# Patient Record
Sex: Female | Born: 1955 | Race: Black or African American | Hispanic: No | Marital: Single | Smoking: Never smoker
Health system: Southern US, Community
[De-identification: ages and names within clinical notes are randomized; demographics above are authoritative.]

## PROBLEM LIST (undated history)

## (undated) DIAGNOSIS — M79605 Pain in left leg: Secondary | ICD-10-CM

## (undated) DIAGNOSIS — C801 Malignant (primary) neoplasm, unspecified: Secondary | ICD-10-CM

## (undated) DIAGNOSIS — H269 Unspecified cataract: Secondary | ICD-10-CM

## (undated) DIAGNOSIS — I639 Cerebral infarction, unspecified: Secondary | ICD-10-CM

## (undated) DIAGNOSIS — F32A Depression, unspecified: Secondary | ICD-10-CM

## (undated) DIAGNOSIS — F419 Anxiety disorder, unspecified: Secondary | ICD-10-CM

## (undated) HISTORY — PX: JOINT REPLACEMENT: SHX530

## (undated) HISTORY — PX: LIVER SURGERY: SHX698

## (undated) HISTORY — DX: Cerebral infarction, unspecified: I63.9

## (undated) HISTORY — PX: ABDOMINAL HYSTERECTOMY: SHX81

## (undated) HISTORY — DX: Malignant (primary) neoplasm, unspecified: C80.1

## (undated) HISTORY — PX: COLONOSCOPY: SHX174

## (undated) HISTORY — PX: COLON SURGERY: SHX602

## (undated) HISTORY — DX: Unspecified cataract: H26.9

## (undated) HISTORY — DX: Pain in left leg: M79.605

## (undated) HISTORY — PX: KNEE ARTHROSCOPY: SUR90

## (undated) HISTORY — PX: LIVER RESECTION: SHX1977

## (undated) HISTORY — DX: Depression, unspecified: F32.A

## (undated) HISTORY — DX: Anxiety disorder, unspecified: F41.9

## (undated) HISTORY — PX: HYSTERECTOMY: SHX81

---

## 2006-01-16 ENCOUNTER — Inpatient Hospital Stay
Admission: EM | Admit: 2006-01-16 | Disposition: A | Discharge status: Home / Self Care | Payer: Self-pay | Source: Ambulatory Visit

## 2006-01-22 ENCOUNTER — Ambulatory Visit
Admission: RE | Admit: 2006-01-22 | Disposition: A | Discharge status: Home / Self Care | Payer: Self-pay | Source: Ambulatory Visit

## 2007-08-05 ENCOUNTER — Observation Stay
Admission: EM | Admit: 2007-08-05 | Disposition: A | Discharge status: Home / Self Care | Payer: Self-pay | Source: Ambulatory Visit | Admitting: Internal Medicine

## 2007-11-29 ENCOUNTER — Ambulatory Visit: Admit: 2007-11-29 | Disposition: A | Discharge status: Home / Self Care | Payer: Self-pay | Source: Ambulatory Visit

## 2007-12-10 ENCOUNTER — Ambulatory Visit
Admission: RE | Admit: 2007-12-10 | Disposition: A | Discharge status: Home / Self Care | Payer: Self-pay | Source: Ambulatory Visit

## 2008-02-07 DIAGNOSIS — C801 Malignant (primary) neoplasm, unspecified: Secondary | ICD-10-CM

## 2008-02-07 HISTORY — DX: Malignant (primary) neoplasm, unspecified: C80.1

## 2008-02-18 ENCOUNTER — Ambulatory Visit
Admission: RE | Admit: 2008-02-18 | Disposition: A | Discharge status: Home / Self Care | Payer: Self-pay | Source: Ambulatory Visit | Admitting: Gastroenterology

## 2008-02-26 ENCOUNTER — Inpatient Hospital Stay
Admission: RE | Admit: 2008-02-26 | Disposition: A | Discharge status: Home / Self Care | Payer: Self-pay | Source: Ambulatory Visit | Admitting: Surgery

## 2008-04-16 ENCOUNTER — Ambulatory Visit: Admit: 2008-04-16 | Disposition: A | Discharge status: Home / Self Care | Payer: Self-pay | Source: Ambulatory Visit

## 2008-06-18 ENCOUNTER — Ambulatory Visit
Admission: RE | Admit: 2008-06-18 | Disposition: A | Discharge status: Home / Self Care | Payer: Self-pay | Source: Ambulatory Visit

## 2008-07-01 ENCOUNTER — Ambulatory Visit
Admission: RE | Admit: 2008-07-01 | Disposition: A | Discharge status: Home / Self Care | Payer: Self-pay | Source: Ambulatory Visit

## 2008-07-07 ENCOUNTER — Ambulatory Visit
Admission: RE | Admit: 2008-07-07 | Disposition: A | Discharge status: Home / Self Care | Payer: Self-pay | Source: Ambulatory Visit

## 2008-07-15 ENCOUNTER — Ambulatory Visit
Admission: RE | Admit: 2008-07-15 | Disposition: A | Discharge status: Home / Self Care | Payer: Self-pay | Source: Ambulatory Visit

## 2008-10-05 ENCOUNTER — Ambulatory Visit
Admission: RE | Admit: 2008-10-05 | Disposition: A | Discharge status: Home / Self Care | Payer: Self-pay | Source: Ambulatory Visit

## 2008-11-25 ENCOUNTER — Inpatient Hospital Stay
Admission: AD | Admit: 2008-11-25 | Disposition: A | Discharge status: Home / Self Care | Payer: Self-pay | Source: Ambulatory Visit | Admitting: Hematology & Oncology

## 2009-02-25 ENCOUNTER — Ambulatory Visit
Admission: RE | Admit: 2009-02-25 | Disposition: A | Discharge status: Home / Self Care | Payer: Self-pay | Source: Ambulatory Visit

## 2009-03-09 DIAGNOSIS — C787 Secondary malignant neoplasm of liver and intrahepatic bile duct: Secondary | ICD-10-CM

## 2009-03-11 ENCOUNTER — Ambulatory Visit
Admission: RE | Admit: 2009-03-11 | Disposition: A | Discharge status: Home / Self Care | Payer: Self-pay | Source: Ambulatory Visit

## 2009-03-29 ENCOUNTER — Ambulatory Visit
Admission: RE | Admit: 2009-03-29 | Disposition: A | Discharge status: Home / Self Care | Payer: Self-pay | Source: Ambulatory Visit

## 2009-04-12 ENCOUNTER — Ambulatory Visit
Admission: RE | Admit: 2009-04-12 | Disposition: A | Discharge status: Home / Self Care | Payer: Self-pay | Source: Ambulatory Visit

## 2009-04-30 ENCOUNTER — Ambulatory Visit
Admission: RE | Admit: 2009-04-30 | Disposition: A | Discharge status: Home / Self Care | Payer: Self-pay | Source: Ambulatory Visit

## 2009-11-04 ENCOUNTER — Ambulatory Visit
Admission: RE | Admit: 2009-11-04 | Disposition: A | Discharge status: Home / Self Care | Payer: Self-pay | Source: Ambulatory Visit

## 2010-08-31 ENCOUNTER — Ambulatory Visit
Admission: RE | Admit: 2010-08-31 | Disposition: A | Discharge status: Home / Self Care | Payer: Self-pay | Source: Ambulatory Visit

## 2010-09-14 ENCOUNTER — Ambulatory Visit
Admission: RE | Admit: 2010-09-14 | Disposition: A | Discharge status: Home / Self Care | Payer: Self-pay | Source: Ambulatory Visit

## 2011-06-05 ENCOUNTER — Ambulatory Visit
Admission: RE | Admit: 2011-06-05 | Disposition: A | Discharge status: Home / Self Care | Payer: Self-pay | Source: Ambulatory Visit

## 2011-09-14 ENCOUNTER — Ambulatory Visit: Admit: 2011-09-14 | Disposition: A | Discharge status: Home / Self Care | Payer: Self-pay | Source: Ambulatory Visit

## 2011-09-15 ENCOUNTER — Ambulatory Visit
Admission: RE | Admit: 2011-09-15 | Disposition: A | Discharge status: Home / Self Care | Payer: Self-pay | Source: Ambulatory Visit

## 2011-12-01 ENCOUNTER — Ambulatory Visit
Admission: RE | Admit: 2011-12-01 | Disposition: A | Discharge status: Home / Self Care | Payer: Self-pay | Source: Ambulatory Visit

## 2011-12-21 ENCOUNTER — Ambulatory Visit
Admission: RE | Admit: 2011-12-21 | Disposition: A | Discharge status: Home / Self Care | Payer: Self-pay | Source: Ambulatory Visit | Admitting: Orthopaedic Surgery

## 2012-02-15 ENCOUNTER — Ambulatory Visit: Admit: 2012-02-15 | Disposition: A | Discharge status: Home / Self Care | Payer: Self-pay | Source: Ambulatory Visit

## 2012-03-13 LAB — CEA: CEA-Abbott: 0.58 ng/mL (ref 0.00–3.00)

## 2012-03-14 ENCOUNTER — Ambulatory Visit
Admission: RE | Admit: 2012-03-14 | Disposition: A | Discharge status: Home / Self Care | Payer: Self-pay | Source: Ambulatory Visit

## 2012-03-14 LAB — CBC AND DIFFERENTIAL
Basophils %: 0.3 % (ref 0.0–3.0)
Basophils Absolute: 0 10*3/uL (ref 0.0–0.3)
Eosinophils %: 0.9 % (ref 0.0–7.0)
Eosinophils Absolute: 0.1 10*3/uL (ref 0.0–0.8)
Hematocrit: 47.7 % (ref 36.0–48.0)
Hemoglobin: 15.4 gm/dL (ref 12.0–16.0)
Lymphocytes Absolute: 2.3 10*3/uL (ref 0.6–5.1)
Lymphocytes: 25.1 % (ref 15.0–46.0)
MCH: 30 pg (ref 28–35)
MCHC: 32 gm/dL (ref 32–36)
MCV: 94 fL (ref 80–100)
MPV: 7.7 fL (ref 6.0–10.0)
Monocytes Absolute: 0.5 10*3/uL (ref 0.1–1.7)
Monocytes: 5 % (ref 3.0–15.0)
Neutrophils %: 68.7 % (ref 42.0–78.0)
Neutrophils Absolute: 6.3 10*3/uL (ref 1.7–8.6)
PLT CT: 269 10*3/uL (ref 130–440)
RBC: 5.06 10*6/uL — ABNORMAL HIGH (ref 3.80–5.00)
RDW: 11.8 % (ref 11.0–14.0)
WBC: 9.2 10*3/uL (ref 4.0–11.0)

## 2012-03-14 LAB — COMPREHENSIVE METABOLIC PANEL
ALT: 21 U/L (ref 0–55)
AST (SGOT): 23 U/L (ref 10–42)
Albumin/Globulin Ratio: 0.93 Ratio (ref 0.70–1.50)
Albumin: 4.1 gm/dL (ref 3.5–5.0)
Alkaline Phosphatase: 117 U/L (ref 40–145)
Anion Gap: 12.8 mMol/L (ref 7.0–18.0)
BUN / Creatinine Ratio: 19 Ratio (ref 10.0–30.0)
BUN: 19 mg/dL (ref 7–22)
Bilirubin, Total: 0.6 mg/dL (ref 0.1–1.2)
CO2: 27.1 mMol/L (ref 20.0–30.0)
Calcium: 10.3 mg/dL (ref 8.5–10.5)
Chloride: 104 mMol/L (ref 98–110)
Creatinine: 1 mg/dL (ref 0.60–1.20)
EGFR: >60 mL/min/{1.73_m2}
Globulin: 4.4 gm/dL — ABNORMAL HIGH (ref 2.0–4.0)
Glucose: 78 mg/dL (ref 70–99)
Osmolality Calc: 281 mOsm/kg (ref 275–300)
Potassium: 3.9 mMol/L (ref 3.5–5.3)
Protein, Total: 8.5 gm/dL — ABNORMAL HIGH (ref 6.0–8.3)
Sodium: 140 mMol/L (ref 136–147)

## 2012-03-18 LAB — PT AND APTT
PT INR: 1 (ref 0.5–1.3)
PT: 10.9 s (ref 9.5–12.4)
aPTT: 27.4 s (ref 24.0–34.0)

## 2012-03-18 LAB — COMPREHENSIVE METABOLIC PANEL
ALT: 21 U/L (ref 0–55)
AST (SGOT): 18 U/L (ref 10–42)
Albumin/Globulin Ratio: 0.97 Ratio (ref 0.70–1.50)
Albumin: 3.7 gm/dL (ref 3.5–5.0)
Alkaline Phosphatase: 107 U/L (ref 40–145)
Anion Gap: 13 mMol/L (ref 7.0–18.0)
BUN / Creatinine Ratio: 16.2 Ratio (ref 10.0–30.0)
BUN: 16 mg/dL (ref 7–22)
Bilirubin, Total: 0.6 mg/dL (ref 0.1–1.2)
CO2: 26.1 mMol/L (ref 20.0–30.0)
Calcium: 9.9 mg/dL (ref 8.5–10.5)
Chloride: 105 mMol/L (ref 98–110)
Creatinine: 0.99 mg/dL (ref 0.60–1.20)
EGFR: >60 mL/min/{1.73_m2}
Globulin: 3.8 gm/dL (ref 2.0–4.0)
Glucose: 88 mg/dL (ref 70–99)
Osmolality Calc: 280 mOsm/kg (ref 275–300)
Potassium: 4.1 mMol/L (ref 3.5–5.3)
Protein, Total: 7.5 gm/dL (ref 6.0–8.3)
Sodium: 140 mMol/L (ref 136–147)

## 2012-03-18 LAB — CBC AND DIFFERENTIAL
Basophils %: 0 % (ref 0.0–3.0)
Basophils Absolute: 0 10*3/uL (ref 0.0–0.3)
Eosinophils %: 0.9 % (ref 0.0–7.0)
Eosinophils Absolute: 0.1 10*3/uL (ref 0.0–0.8)
Hematocrit: 42.4 % (ref 36.0–48.0)
Hemoglobin: 14.6 gm/dL (ref 12.0–16.0)
Lymphocytes Absolute: 2.2 10*3/uL (ref 0.6–5.1)
Lymphocytes: 25 % (ref 15.0–46.0)
MCH: 32 pg (ref 28–35)
MCHC: 34 gm/dL (ref 32–36)
MCV: 93 fL (ref 80–100)
MPV: 7.1 fL (ref 6.0–10.0)
Monocytes Absolute: 0.5 10*3/uL (ref 0.1–1.7)
Monocytes: 6.2 % (ref 3.0–15.0)
Neutrophils %: 67.9 % (ref 42.0–78.0)
Neutrophils Absolute: 5.9 10*3/uL (ref 1.7–8.6)
PLT CT: 244 10*3/uL (ref 130–440)
RBC: 4.55 10*6/uL (ref 3.80–5.00)
RDW: 11.6 % (ref 11.0–14.0)
WBC: 8.7 10*3/uL (ref 4.0–11.0)

## 2012-06-03 ENCOUNTER — Ambulatory Visit
Admission: RE | Admit: 2012-06-03 | Disposition: A | Discharge status: Home / Self Care | Payer: Self-pay | Source: Ambulatory Visit | Attending: Hematology & Oncology | Admitting: Hematology & Oncology

## 2012-06-03 LAB — CEA: CEA-Abbott: <0.5 ng/mL (ref 0.00–3.00)

## 2012-06-18 LAB — ECG 12-LEAD
P Wave Axis: 55 deg
P Wave Duration: 92 ms
P-R Interval: 150 ms
Patient Age: 56 years
Q-T Dispersion: 18 ms
Q-T Interval(Corrected): 428 ms
Q-T Interval: 412 ms
QRS Axis: 37 deg
QRS Duration: 78 ms
T Axis: 36 deg
Ventricular Rate: 65 /min

## 2012-06-21 ENCOUNTER — Other Ambulatory Visit: Payer: Self-pay | Admitting: Hematology & Oncology

## 2012-06-21 DIAGNOSIS — D010 Carcinoma in situ of colon: Secondary | ICD-10-CM

## 2012-06-24 ENCOUNTER — Other Ambulatory Visit: Payer: Self-pay

## 2012-06-24 ENCOUNTER — Encounter (INDEPENDENT_AMBULATORY_CARE_PROVIDER_SITE_OTHER): Payer: Self-pay | Admitting: Surgery

## 2012-06-24 ENCOUNTER — Ambulatory Visit (INDEPENDENT_AMBULATORY_CARE_PROVIDER_SITE_OTHER): Payer: Medicare Other | Admitting: Surgery

## 2012-06-24 VITALS — BP 122/82 | HR 72 | Ht 69.0 in | Wt 200.0 lb

## 2012-06-24 NOTE — Progress Notes (Signed)
History and Physical  Patient Care Team:  Lendon Ka, DO as PCP - General    06/24/2012    CC:   Chief Complaint   Patient presents with   . Initial Assessment     resected colon       HPI:  Tamara Bautista is a 57 y.o. female who presents for evaluation for a follow up colonoscopy. She has Stage IV colon cancer but is disease free now. She was diagnosed in 2010 and has not had a follow up colonoscopy.         Past Medical History    No Known Allergies    Past Medical History   Diagnosis Date   . Cancer    . Anxiety    . Depression        Past Surgical History   Procedure Date   . Colon surgery    Liver Resection    Current outpatient prescriptions:FLUoxetine (PROZAC) 20 MG capsule, Take 20 mg by mouth daily., Disp: , Rfl:     History     Social History   . Marital Status: Single     Spouse Name: N/A     Number of Children: N/A   . Years of Education: N/A     Occupational History   . Not on file.     Social History Main Topics   . Smoking status: Never Smoker    . Smokeless tobacco: Not on file   . Alcohol Use: No   . Drug Use: No   . Sexually Active:      Other Topics Concern   . Not on file     Social History Narrative   . No narrative on file       No family history on file.    Review of Systems   Constitutional: Negative for fever, chills, diaphoresis, activity change, appetite change, fatigue and unexpected weight change.   HENT: Negative for hearing loss, ear pain, nosebleeds, congestion, facial swelling, rhinorrhea, sneezing, neck pain, neck stiffness, postnasal drip, tinnitus and ear discharge.    Eyes: Negative for photophobia, pain, discharge, redness, itching and visual disturbance.   Respiratory: Negative for apnea, cough, choking, chest tightness, shortness of breath, wheezing and stridor.    Cardiovascular: Negative for chest pain, palpitations and leg swelling.   Gastrointestinal: Negative for abdominal distention, pain, bleeding, change in bowel habits, or any other symptoms.    Genitourinary: Negative for dysuria, urgency, frequency, hematuria, flank pain, decreased urine volume, vaginal bleeding, vaginal discharge, enuresis, difficulty urinating, genital sores, vaginal pain, menstrual problem, pelvic pain and dyspareunia.   Musculoskeletal: Negative for back pain, joint swelling, arthralgias and gait problem.   Neurological: Negative for dizziness, tremors, seizures, syncope, facial asymmetry, speech difficulty, weakness, light-headedness, numbness and headaches.   Hematological: Negative for adenopathy. Does not bruise/bleed easily.   Psychiatric/Behavioral: Negative for hallucinations, behavioral problems, confusion, dysphoric mood, decreased concentration and agitation.       PE:  BP 122/82  Pulse 72  Ht 1.753 m (5\' 9" )  Wt 90.719 kg (200 lb)  BMI 29.52 kg/m2  General: Alert, oriented X3, in no acute distress  Heent : NC/AT. PERRL. Sclera non-icteric  Neck: Supple, no lymphadenopathy, no bruits  Lungs: CTA  CV: RRR S1 and S2 normal  Abdomen: soft, nontender, non-distended, no masses, no hernia  Extremities:  no clubbing, cyanosis, or edema    Assesment/Plan:    Colonoscopy    The risks, benefits, and alternatives to colonoscopy  were discussed with the patient. Informed consent (infection, bleeding, anesthesia risks, need for further operations/procedures, perforation of the colon, misdiagnoses, missed polyps) was obtained.    This has been scheduled.

## 2012-06-30 NOTE — Discharge Summary (Signed)
[facility]                               DISCHARGE SUMMARY              NAME: Tamara Bautista, Tamara Bautista       ADMITTED:   12/21/2011       MR#:  [id number]                            DISCHARGED: 12/21/2011       DOB:  07-09-1955                           ACCT#:      [id number]       _________________________________________________________________       ADMISSION DIAGNOSIS:       Left knee partial medial meniscectomy and chondrosis.              POSTOPERATIVE DIAGNOSIS:       Left knee partial medial meniscectomy and chondrosis.              PROCEDURE:       Left knee arthroscopic partial medial meniscectomy and       chondroplasty.              SURGEON:       Doylene Canning, MD              ASSISTANT:       Darrold Span, CST              DISCHARGE DISPOSITION:       Home.              DISCHARGE CONDITION:       Good.              FOLLOWUP:       Follow up in 10 days.              DISCHARGE INSTRUCTIONS:       Weightbearing as tolerated with crutches until able to ambulate       with a limp.  Began knee exercises on postop day.  Follow up in       10 days.              DISCHARGE MEDICATIONS:       Medications at discharge include Percocet, Phenergan, Naprosyn.                                            **PRELIMINARY REPORT UNLESS SIGNED**                                SEE DOCUMENT IMAGING SYSTEM FOR FINAL REPORT                                                        ________________________________  Doylene Canning, MD                                                                                                                    ID:   [id number]                                                          DocType:   [id number]       \:   RP                                                                 /:   837       DD:  12/21/2011 13:54:17                                                DT:  12/22/2011 01:24:26                                                 JOB: [id number]                                                            CC:  ERIN H BUETTIN ([id number])                          >                                                                  Authenticated by Stark Bray, MD On 12/25/2011 12:33:41 PM

## 2012-06-30 NOTE — H&P (Signed)
[facility]                             HISTORY AND PHYSICAL              NAME: Tamara Bautista, Tamara Bautista       ADMITTED:   12/21/2011       MR#:  [id number]                            ACCT#:      [id number]       DOB:  Feb 27, 1955       _________________________________________________________________       HISTORY OF PRESENT ILLNESS:       Tamara Bautista has been having left knee pain for the last       couple of months.  She states she did not have a particular       injury to her knee, but she has constant pain at this point with       swelling.  She also states she has a snapping type sensation at       times.  She did have an MRI of her left knee back in August,       which did show some degenerative changes with a tear of the       medial meniscus.  She has failed conservative treatments       including intra-articular cortisone injection and oral pain       medication.  For further details of history of present illness,       please see Tamara Bautista note.              PRIMARY CARE PROVIDER:       Sherolyn Buba, DO              ONCOLOGIST:       Tamara Grills, MD              PAST MEDICAL HISTORY:       1.  Depression.       2.  Anxiety.       3.  Bilateral cataracts.       4.  History of migraine headaches.       5.  History of colon cancer.       6.  History of liver cancer.       7.  History of TIA, 2007.       8.  Questionable history of COPD per physician portal.       9.  Questionable history of asthma per physician portal.       10.  Questionable history of hypertension per physician           portal.              The patient denies the questionable diagnoses.              PAST SURGICAL HISTORY:       1.  Hysterectomy.       2.  Partial hepatectomy x2.       3.  Liver embolization.       4.  Colon resection, 2010.       5.  Left knee arthroscopy approximately 20 years ago.              FAMILY HISTORY:  1.  Mother deceased in her 24s with an unknown type  of           cancer with a history of COPD.       2.  Father deceased in his 70s or 17s with gastric cancer.              SOCIAL HISTORY:       The patient socially drinks alcohol.  She has never used       tobacco.  Denies illicit drug use.  She is single with one child       and her roommate, sister and son will be available to help her       after surgery.  She is currently disabled because of her cancer       diagnosis.                     MEDICATIONS:       1.  Prozac 20 mg in the morning.       2.  Vicodin 5/325 every 6 hours as needed for pain.       3.  Advair, unknown dosage (patient does not currently use).              ALLERGIES:       The patient denies any medication, food, latex or topical iodine       allergies.              REVIEW OF SYSTEMS:       INFECTIOUS DISEASE:  The patient denies a history of staph       infection in the past.  ANESTHESIA:  The patient denies any       problems with anesthesia in the past for herself or any blood       relatives.  GENERAL:  Denies fever, chills.  SKIN:  Denies       rashes or open sores.  HEENT:  She wears glasses.  Has an upper       full denture.  Denies headache, dizziness, hearing loss,       tinnitus, sinus congestion, sore throat, dental pain, gum       infections or oral ulcers.  CV:  Denies chest pain or syncope.       RESPIRATORY:  Positive for exertional dyspnea.  There is a       questionable diagnosis of both COPD and asthma mentioned in a       physician portal history and physical.  The patient denies this,       although she states she was on Advair and did not see any       difference in her dyspnea.  Denies coughing or wheezing.       ENDOCRINE:  Denies heat or cold intolerance, polyuria, or       polydipsia.  GI:  She does have a history of liver cancer and       colon cancer, undergoing surgery for both of those.  Denies any       dysphagia, reflux, heartburn, nausea, vomiting, constipation,       diarrhea, or blood in her stool.  GU:  She  has stress type       urinary incontinence.  Denies any current dysuria, hematuria,       renal calculi, or problems with repeat urinary tract infections.       HEME:  Denies bleeding disorders, blood clots, blood  transfusions in the past.  MUSCULOSKELETAL:  Positive for left       knee pain with associated symptoms.  Please see history of       present illness for full details.  Denies generalized joint pain       or stiffness, back pain or neck pain.  NEURO:  She apparently       has had a TIA in 2007.  Denies seizures, numbness, or       paresthesias.  PSYCH:  Positive for depression and anxiety,       which are currently controlled on medication.              PHYSICAL EXAMINATION:       GENERAL:  Tamara Bautista is a very pleasant, well-nourished,       well-developed, 57 year old female in no acute distress.  She       has normal mood and affect.       VITAL SIGNS:  For a complete set of vital signs, please see       nurse's note.  Blood pressure 113/82, height 5 feet 9 inches,       weight 89 kg/195.8 pounds, BMI 28.9.  Current pain level in her       left knee at rest is 7/10.       SKIN:  Warm and dry to touch.  Skin over the left knee shows       well-healed incisional portal scars, but otherwise this area is       free and clear of any scratches, excoriations, lesions or       rashes.       HEENT:  Head is atraumatic, normocephalic.  She is wearing       glasses.  Pupils PERRLA.  EOMs intact.       NECK:  Supple.  No masses, lymphadenopathy or thyromegaly.       HEART:  Regular rate and rhythm.       LUNGS:  Clear to auscultation.       ABDOMEN:  Soft, nontender, nondistended.  Normoactive bowel       sounds.       GU:  Deferred.              MUSCULOSKELETAL:  Generally, she has good range of motion of her       upper and lower extremities.  No generalized joint swelling or       tenderness.  For details of left knee examination, please see       Tamara Bautista note.       VASCULAR:  No carotid bruits  or peripheral edema noted.  Radial       and posterior tib pulses are intact.       NEUROLOGIC:  Cranial nerves 2-12 intact.  Sensation is decreased       over her left leg, but otherwise is intact globally to light       touch.  Strength 5/5 bilateral upper extremities, 5/5 right       lower extremity and left lower extremity not tested due to pain.              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  There is subchondral marrow       edema in the medial tibial plateau due to overlying articular       cartilage thinning.  Small joint effusion with small Baker cyst.       Mild chondromalacia patella."              ASSESSMENT:       1.  Left knee medial meniscal tear.       2.  Depression.       3.  Anxiety.       4.  Bilateral cataracts.       5.  History of migraine headaches.       6.  History of colon cancer.       7.  History of liver cancer.       8.  History of transient ischemic attack.       9.  Questionable history of chronic obstructive pulmonary           disease per physician portal.       10.  Questionable history of asthma per physician portal.       11.  Questionable history of hypertension per physician           portal.              The patient denies the questionable diagnoses.              PLAN:       Patient is scheduled to undergo a left knee arthroscopy with       partial medial meniscectomy by Dr. Doylene Canning on December 21, 2011.                                            **PRELIMINARY REPORT UNLESS SIGNED**                                SEE DOCUMENT IMAGING SYSTEM FOR FINAL REPORT                I hereby certify this patient for hospitalization based upon        medical necessity as noted above.                                      The above dictated by Kelli Churn, NP                                         ________________________________                                        Doylene Canning, MD  ID:   [id number]                                                          DocType:   01TRH       \:   RR                                                                        /:   837       DD:  12/14/2011 46:96:29                                                DT:  12/14/2011 08:20:11                                                JOB: [id number]                                                            CC:  ERIN H BUETTIN ([id number])                   >                                                                  Authenticated by Stark Bray, MD On 12/25/2011 12:34:28 PM

## 2012-06-30 NOTE — Op Note (Signed)
[facility]                           OPERATIVE/PROCEDURE REPORT         NAME: Tamara Bautista, Tamara Bautista       ADMITTED:   03/14/2012       MR#:  [id number]                            ACCT#:      [id number]       DOB:  [birth year]       _________________________________________________________________       DATE OF OPERATION:           PREOPERATIVE DIAGNOSIS:       Left knee medial tibial plateau insufficiency fracture.         POSTOPERATIVE DIAGNOSIS:       Left knee medial tibial plateau insufficiency fracture.         PROCEDURE:       Arthroscopic assisted fixation of medial tibial plateau fracture       with self hardening bone graft substitute without hardware.         SURGEON:       Doylene Canning, MD         ASSISTANT:       Starleen Arms, PA         ANESTHESIA:       General.         ESTIMATED BLOOD LOSS:       6 cc.         INDICATIONS:       Tamara Bautista is a 57 year old woman with medial insufficiency       fracture in tibial plateau.  She failed conservative measures.       She had meniscal tear as well, which was treated       arthroscopically previously.  Unfortunately, this did not fully       relieve her pain, but continued to have pain from the       insufficiency fracture of the medial side of the joint and       failed to get relief of pain with conservative care.  She       elected for surgical intervention.  Risks and benefits were       detailed according to her chart.         DESCRIPTION OF PROCEDURE:       The patient was identified in the preoperative holding area.       Consent was reviewed.  Operative site was marked.  She was       transferred to the operative suite, where general anesthetic was       induced.  She was properly positioned on the table.  All bony       prominences were padded.  Left lower extremity was prepped and       draped in usual sterile fashion.  Tourniquet was inflated after       antibiotic administration.         We  entered the knee through anterior and lateral portal through       her prior incision.  We inspected the knee.  We found no new       damage, no meniscal tears, no significant changes in the level       of chondrosis throughout  the knee.  We then used fluoroscopic       guidance to place the trephine for bone graft substitute       injection into the center of the insufficiency fracture, both on       AP and lateral radiographs.  Once this was completed, we         injected 5 cc of ceramic self hardening bone graft substitute       into the affected area, turning the trephination and the cannula       in between each injection so as to provide full coverage.  We       then inserted the trocar back down the center to inject the       final ______ cannula, segments of portion of the graft were then       left in place for 10 minutes to the hardened.  This provided a       fixation.  Arthroscopic visualization during this should no       advancement of the bone graft substitute through the cartilage       and the joint.  The cartilage was in the same repair as before       the injection.  After it had dried, we removed the cannula,       closed the incisions with 3-0 nylon.  Sterile dressing was       applied.  Postoperative injection was placed intra-articularly       before the dressing.  Toradol, bupivacaine and Astramorph was       injected into the suprapatellar pouch of the knee.         Patient will be discharged home in the care of her family.       Medications on discharge include OxyContin 30 mg b.i.d.,       Dilaudid 2 mg p.o. q.4 h. p.r.n. and Phenergan 25 mg p.o. q. 8       h. p.r.n.  Appear to be touchdown weightbearing left lower       extremity with crutches.  Follow up with me in 10 days.  Begin       knee exercises at home for range of motion.                                  **PRELIMINARY REPORT UNLESS SIGNED**                          SEE DOCUMENT IMAGING SYSTEM FOR FINAL REPORT                                             ________________________________                                        Doylene Canning, MD               ID:   [id number]       DocType:   01TRO       \:   SP       /:   837       DD:  03/14/2012 16:21:08  DT:  03/15/2012 07:13:53       JOB: [id number]       CC:  ERIN H BUETTIN ([id number])         >  Authenticated and Edited by Stark Bray, MD On 03/21/12 10:14:37 AM

## 2012-06-30 NOTE — Op Note (Signed)
[facility]                           OPERATIVE/PROCEDURE REPORT         NAME: Tamara Bautista, Tamara Bautista       ADMITTED:   12/21/2011       MR#:  [id number]                            ACCT#:      [id number]       DOB:  Apr 29, 1955       _________________________________________________________________       DATE OF OPERATION:       12/21/2011         PREOPERATIVE DIAGNOSIS:       Left knee medial meniscal tear.         POSTOPERATIVE DIAGNOSIS:       Left knee medial meniscal tear.         PROCEDURE:       Partial medial meniscectomy and chondroplasty.         SURGEON:       Doylene Canning, MD         ASSISTANT:       Darrold Span, CST         ANESTHESIA:       General.         ESTIMATED BLOOD LOSS:       Less than 10 cc.         INDICATIONS:       Ms. Schatzman is a 57 year old woman, who has persistent       medial-sided left knee pain, failing conservative measures.  MRI       and exam were consistent with the above diagnoses.  She elected       for surgical intervention.  Risks and benefits were detailed       according to the patient's chart.         DESCRIPTION OF PROCEDURE:       On the day of the procedure, the patient was identified in the       preoperative holding area.  Consent was reviewed and the       operative site was marked.  They were then taken to the       operative suite where general anesthesia with intubation was       performed.  They were then properly positioned on the table with       all bony prominences padded.  An upper body Bair Hugger was       applied.  Time-out was performed and antibiotics were       administered.  The operative extremity was placed in a leg       holder with a tourniquet.  The nonoperative extremity was placed       in the foot and ankle stirrup.  TED hose and SCDs were applied       to the contralateral lower extremity.  The operative lower       extremity was then prepped and draped in the usual sterile       fashion.   The limb was exsanguinated.  The tourniquet was       inflated and the procedure was begun with the identification of       bony landmarks and an anterior lateral viewing portal.  Anterior  medial portal was then created under direct visualization and       inspection of the knee was performed.         Initial inspection of the knee revealed intact lateral joint       space, intact cartilage, no meniscal tear.  ACL and PCL were         intact.  Patellofemoral joint showed significant ulceration of       the trochlear cartilage over a fairly broad area with some loose       edges, which were stabilized by debridement with electric       shaver.  There was some minor grade 3 fissuring at the apex of       the patella, but was otherwise intact.         We entered the medial joint space and identified significant       partial ulceration and delamination of the cartilage of the       medial femoral condyle as well.  There was softening and       fibrillation of the tibial plateau on the medial side.  There       was a horizontal degenerative tear of the medial meniscus, on       which we performed a partial crescentic medial meniscectomy using       basket forceps and shaver.  Once this was completed, the knee       was evacuated of all fluid and loose bodies.  The portals were       closed with 3-0 nylon.  Injection of Toradol, Astramorph, And       bupivacaine was placed into the intra-articular space, and then       a sterile dressing was applied.  Patient was awakened by       Anesthesia, transported to postop recovery room without       complications.                                  **PRELIMINARY REPORT UNLESS SIGNED**                          SEE DOCUMENT IMAGING SYSTEM FOR FINAL REPORT                                            ________________________________                                        Doylene Canning, MD               ID:   [id number]       DocType:   01TRO       \:   RP       /:   837       DD:   12/21/2011 13:54:17       DT:  12/22/2011 01:28:04       JOB: [id number]       CC:  ERIN H BUETTIN ([id number])         >  Authenticated and Edited by Stark Bray, MD On 12/25/11 9:51:02 AM

## 2012-07-16 ENCOUNTER — Ambulatory Visit (HOSPITAL_BASED_OUTPATIENT_CLINIC_OR_DEPARTMENT_OTHER): Payer: Medicare Other | Admitting: Surgery

## 2012-07-16 ENCOUNTER — Encounter (HOSPITAL_BASED_OUTPATIENT_CLINIC_OR_DEPARTMENT_OTHER)
Admission: RE | Disposition: A | Discharge status: Home / Self Care | Payer: Self-pay | Source: Ambulatory Visit | Attending: Surgery

## 2012-07-16 ENCOUNTER — Encounter (HOSPITAL_BASED_OUTPATIENT_CLINIC_OR_DEPARTMENT_OTHER): Payer: Self-pay

## 2012-07-16 ENCOUNTER — Ambulatory Visit
Admission: RE | Admit: 2012-07-16 | Discharge: 2012-07-16 | Disposition: A | Discharge status: Home / Self Care | Payer: Medicare Other | Source: Ambulatory Visit | Attending: Surgery | Admitting: Surgery

## 2012-07-16 DIAGNOSIS — C189 Malignant neoplasm of colon, unspecified: Secondary | ICD-10-CM | POA: Insufficient documentation

## 2012-07-16 DIAGNOSIS — Z1211 Encounter for screening for malignant neoplasm of colon: Secondary | ICD-10-CM | POA: Insufficient documentation

## 2012-07-16 DIAGNOSIS — Z800 Family history of malignant neoplasm of digestive organs: Secondary | ICD-10-CM | POA: Insufficient documentation

## 2012-07-16 HISTORY — PX: COLONOSCOPY: SHX174

## 2012-07-16 SURGERY — DONT USE, USE 1094-COLONOSCOPY, DIAGNOSTIC (SCREENING)
Anesthesia: Conscious Sedation | Site: Abdomen | Wound class: Clean Contaminated

## 2012-07-16 MED ORDER — SODIUM CHLORIDE 0.9 % IV SOLN
INTRAVENOUS | Status: DC
Start: 2012-07-16 — End: 2012-07-16

## 2012-07-16 MED ORDER — MIDAZOLAM HCL 5 MG/5ML IJ SOLN
INTRAMUSCULAR | Status: DC | PRN
Start: 2012-07-16 — End: 2012-07-16
  Administered 2012-07-16 (×2): 2 mg via INTRAVENOUS

## 2012-07-16 MED ORDER — LIDOCAINE-EPINEPHRINE 1 %-1:100000 IJ SOLN
INTRAMUSCULAR | Status: AC
Start: 2012-07-16 — End: ?
  Filled 2012-07-16: qty 20

## 2012-07-16 MED ORDER — MIDAZOLAM HCL 5 MG/5ML IJ SOLN
INTRAMUSCULAR | Status: AC
Start: 2012-07-16 — End: ?
  Filled 2012-07-16: qty 10

## 2012-07-16 MED ORDER — MEPERIDINE HCL 100 MG/2ML IJ SOLN
INTRAMUSCULAR | Status: DC | PRN
Start: 2012-07-16 — End: 2012-07-16
  Administered 2012-07-16 (×2): 50 mg

## 2012-07-16 MED ORDER — MEPERIDINE HCL 50 MG/ML IJ SOLN
INTRAMUSCULAR | Status: AC
Start: 2012-07-16 — End: ?
  Filled 2012-07-16: qty 3

## 2012-07-16 SURGICAL SUPPLY — 2 items
CONN IRRG TORRENT 1WAYVAL ENDO (Supply) ×2 IMPLANT
TUBING IRRIGATION TORRENT (Supply) ×2 IMPLANT

## 2012-07-16 NOTE — Op Note (Signed)
See provation

## 2012-07-16 NOTE — Discharge Summary (Signed)
The patient was admitted for outpatient surgery.  She did well and was discharged afterwards. Please see EPIC EMR for discharge meds, diet, followup and procedure.

## 2012-07-16 NOTE — Discharge Instructions (Signed)
[facility]  Colonoscopy/Sigmoidoscopy  Discharge Instructions    Thank you for allowing [place] to be a part of your health care experience.  We realize you may not fully recall your discharge care.  The following information is to guide you.    A.  After you leave the hospital:   1.  Due to the effects of the sedatives, you may feel tired for the remainder of today.   2.  DO NOT DRIVE OR OPERATE HAZARDOUS MACHINERY until tomorrow.   3.  Please rest and drink extra fluids today.  Avoid alcohol today.   4.  Resume your normal diet as tolerated.   5. If you experience anal soreness, you may apply a soothing ointment of your choice.   6.  You may feel bloated and pass air today.   7.  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    B.  If you experience any of the following danger signs or symptoms, call your doctor immediately:  After business hours call [phone] for [facility] for physician guidance.    1.   Passing  Blood in the stool or a change of stool consistency (black).   2.  Passing Clotted blood.   3.  New onset of pain or distension, that does not subside or prevents you from normal activity.   4.  Redness or swelling at the IV insertion site that continues or worsens over 2-3 days. Initial warm compresses may be helpful.    C.  Follow-up Care:  {[facility] OR [facility] COLONOSCOPY/UPPER ENDO FOLLOW UP EXBM:84132}    D.  Medication:   Resume home medications {[facility] OR [facility] UPPER ENDO MEDICATION:26306}    Discontinued/Held Medicationsati    Signing this means I understand the above and have received a copy.    All of [place] [facility] who supported you today during your procedure wish you a quick and comfortable recovery.  For any questions or concerns about your personal care, contact us at [phone].

## 2012-07-16 NOTE — Brief Op Note (Signed)
See provation

## 2012-07-17 ENCOUNTER — Other Ambulatory Visit: Payer: Self-pay | Admitting: Orthopaedic Surgery

## 2012-07-17 ENCOUNTER — Encounter (HOSPITAL_BASED_OUTPATIENT_CLINIC_OR_DEPARTMENT_OTHER): Payer: Self-pay | Admitting: Surgery

## 2012-07-23 ENCOUNTER — Ambulatory Visit
Admission: RE | Admit: 2012-07-23 | Discharge: 2012-07-23 | Disposition: A | Discharge status: Home / Self Care | Payer: Medicare Other | Source: Ambulatory Visit | Attending: Orthopaedic Surgery | Admitting: Orthopaedic Surgery

## 2012-07-23 DIAGNOSIS — M25569 Pain in unspecified knee: Secondary | ICD-10-CM | POA: Insufficient documentation

## 2012-07-23 MED ORDER — TECHNETIUM TC 99M MEDRONATE INJECTION
24.6000 | Freq: Once | Status: AC | PRN
Start: 2012-07-23 — End: 2012-07-23
  Administered 2012-07-23: 24.6 via INTRAVENOUS
  Filled 2012-07-23: qty 24.6

## 2012-07-25 ENCOUNTER — Encounter (HOSPITAL_BASED_OUTPATIENT_CLINIC_OR_DEPARTMENT_OTHER): Payer: Self-pay

## 2012-07-25 MED FILL — Meperidine HCl Inj 50 MG/ML: INTRAMUSCULAR | Qty: 1 | Status: AC

## 2012-09-18 ENCOUNTER — Emergency Department: Payer: Medicare Other

## 2012-09-18 ENCOUNTER — Emergency Department
Admission: EM | Admit: 2012-09-18 | Discharge: 2012-09-18 | Disposition: A | Discharge status: Home / Self Care | Payer: Medicare Other | Attending: Emergency Medicine | Admitting: Emergency Medicine

## 2012-09-18 DIAGNOSIS — M5430 Sciatica, unspecified side: Secondary | ICD-10-CM | POA: Insufficient documentation

## 2012-09-18 MED ORDER — METHYLPREDNISOLONE (PAK) 4 MG PO TABS
ORAL_TABLET | ORAL | Status: DC
Start: 2012-09-18 — End: 2013-09-13

## 2012-09-18 MED ORDER — OXYCODONE-ACETAMINOPHEN 5-325 MG PO TABS
1.0000 | ORAL_TABLET | Freq: Four times a day (QID) | ORAL | Status: DC | PRN
Start: 2012-09-18 — End: 2017-05-24

## 2012-09-18 NOTE — ED Notes (Signed)
2 weeks ago Sharp L glut pain radx down L leg. Pt denies falls or injury, denies other medical complaints at this time. Pain regardless of position or movement.

## 2012-09-18 NOTE — ED Provider Notes (Signed)
Physician/Midlevel provider first contact with patient: 09/18/12 1946         History     Chief Complaint   Patient presents with   . Back Pain     HPI  Left buttock pain radiating down leg for 2 weeks, hurts all time, worse with movement, no injury, no numb, no weak, no fever.  Past Medical History   Diagnosis Date   . Cancer    . Anxiety    . Depression    . Stroke      L sided weakness       Past Surgical History   Procedure Date   . Colon surgery    . Hysterectomy    . Liver resection      liver resection for cancer   . Knee arthroscopy      Left knee   . Colonoscopy 07/16/2012     Procedure: COLONOSCOPY;  Surgeon: Noemi Chapel, MD;  Location: [facility];  Service: General;  Laterality: N/A;       No family history on file.    Social  History   Substance Use Topics   . Smoking status: Never Smoker    . Smokeless tobacco: Not on file   . Alcohol Use: Yes      Comment: occasionally       .     No Known Allergies    Current/Home Medications    IBUPROFEN (ADVIL,MOTRIN) 600 MG TABLET    Take 600 mg by mouth every 6 (six) hours as needed.        Review of Systems   Constitutional: Negative for fever, chills, fatigue and unexpected weight change.   Musculoskeletal: Negative for myalgias, back pain and joint swelling.   Skin: Negative for rash.   Neurological: Positive for numbness. Negative for dizziness, tremors, weakness and light-headedness.       Physical Exam    BP 132/84  Pulse 73  Temp 98.2 F (36.8 C)  Resp 18  Wt 92.5 kg  SpO2 100%    Physical Exam   Nursing note and vitals reviewed.  Constitutional: She is oriented to person, place, and time. She appears well-developed and well-nourished. No distress.   Abdominal: She exhibits no distension. There is no tenderness.   Musculoskeletal: Normal range of motion. She exhibits tenderness (left buttock). She exhibits no edema.   Neurological: She is alert and oriented to person, place, and time. She displays normal reflexes. She exhibits normal  muscle tone. Coordination normal.   Skin: Skin is warm and dry. No rash noted.   Psychiatric: She has a normal mood and affect.       MDM and ED Course     ED Medication Orders     None           MDM  This patient presents with back/neck pain that seems musculoskeletal in origin. Based on my history and examination, several diagnoses including cauda equina syndrome, infection, AAA, kidney stones, have been considered and are unlikely. No life-threatening etiologies of back pain are discernible at this time, and the patient's symptoms will be managed with symptomatic care. I advised the patient to return to the emergency department immediately should they develop worsening pain, fever, weakness, bowel or bladder symptoms, or any acute concerns .  The patient was deemed well enough for discharge.  Diagnostic impression and plan were discussed with the patient and/or family.  If ordered, results of lab/radiology tests were discussed with the patient  and/or family. Questions were answered and concerns were addressed.  The patient was encouraged to follow-up with their primary care provider or specialist if not improved.    Procedures    Clinical Impression & Disposition     Clinical Impression  Final diagnoses:   Sciatica of left side        ED Disposition     Discharge [facility] discharge to home/self care.    Condition at discharge: Good             New Prescriptions    METHYLPREDNISOLONE (MEDROL DOSPACK) 4 MG TABLET    follow package directions    OXYCODONE-ACETAMINOPHEN (PERCOCET) 5-325 MG PER TABLET    Take 1-2 tablets by mouth every 6 (six) hours as needed for Pain.           7    Wyona Almas, MD  10/01/12 1320

## 2012-09-18 NOTE — ED Notes (Signed)
Pt had appointment for tomorrow with pcp, but was cancelled. Pt doesn't have another orthopedic follow up with [facility] until sept. Pt denies inc pain or relief with palp. Friend at bedside, no other needs expressed

## 2012-09-18 NOTE — Discharge Instructions (Signed)
Understanding Sciatica  Sciatica is leg pain often due to pressure on a nerve in your low back that connects to the sciatic nerve. This pressure may be caused by a damaged disk or by abnormal bone growth. You may feel pain, burning, tingling, or numbness that shoots down your leg.   Pressure from a Damaged Disk  Constant wear and tear on a disk can cause it to weaken. Part of the disk may push outward (herniate). Part of the disk may then press on nearby nerves. If this nerve leads to the sciatic nerve, you may feel pain down your leg.   Pressure from Bone  A disk can wear out and thin with age. This can cause the vertebrae above and below the disk to touch, putting pressure on a nerve. Abnormal bone growths called bone spurs can also form where the bones rub together. These can narrow the spinal canal and press on nerves.     2000-2014 [employer], [address], [place] [address]. All rights reserved. This information is not intended as a substitute for professional medical care. Always follow your healthcare professional's instructions.

## 2012-09-26 ENCOUNTER — Other Ambulatory Visit (INDEPENDENT_AMBULATORY_CARE_PROVIDER_SITE_OTHER): Payer: Self-pay | Admitting: Internal Medicine

## 2012-10-05 ENCOUNTER — Ambulatory Visit (INDEPENDENT_AMBULATORY_CARE_PROVIDER_SITE_OTHER)
Admission: RE | Admit: 2012-10-05 | Discharge: 2012-10-05 | Disposition: A | Discharge status: Home / Self Care | Payer: Medicare Other | Source: Ambulatory Visit | Attending: Internal Medicine | Admitting: Internal Medicine

## 2012-10-05 MED ORDER — GADOPENTETATE DIMEGLUMINE 469.01 MG/ML IV SOLN
20.00 mL | Freq: Once | INTRAVENOUS | Status: AC | PRN
Start: 2012-10-05 — End: 2012-10-05
  Administered 2012-10-05: 20 mL via INTRAVENOUS

## 2012-11-20 DIAGNOSIS — M25569 Pain in unspecified knee: Secondary | ICD-10-CM | POA: Insufficient documentation

## 2012-11-27 ENCOUNTER — Ambulatory Visit
Admission: RE | Admit: 2012-11-27 | Discharge: 2012-11-27 | Disposition: A | Discharge status: Home / Self Care | Payer: Medicare Other | Source: Ambulatory Visit | Attending: Hematology & Oncology | Admitting: Hematology & Oncology

## 2012-11-27 DIAGNOSIS — C187 Malignant neoplasm of sigmoid colon: Secondary | ICD-10-CM | POA: Insufficient documentation

## 2012-11-27 MED ORDER — IOHEXOL 350 MG/ML IV SOLN
100.00 mL | Freq: Once | INTRAVENOUS | Status: AC | PRN
Start: 2012-11-27 — End: 2012-11-27
  Administered 2012-11-27: 100 mL via INTRAVENOUS
  Filled 2012-11-27: qty 100

## 2012-11-27 MED ORDER — IOHEXOL 240 MG/ML IJ SOLN
50.00 mL | Freq: Once | INTRAMUSCULAR | Status: AC | PRN
Start: 2012-11-27 — End: 2012-11-27
  Administered 2012-11-27: 50 mL via ORAL
  Filled 2012-11-27: qty 50

## 2012-12-13 ENCOUNTER — Encounter (INDEPENDENT_AMBULATORY_CARE_PROVIDER_SITE_OTHER): Payer: Self-pay

## 2013-03-14 LAB — ECG 12-LEAD
P Wave Axis: 62 deg
P Wave Duration: 82 ms
P-R Interval: 150 ms
Patient Age: 52 years
Q-T Dispersion: 66 ms
Q-T Interval(Corrected): 424 ms
Q-T Interval: 424 ms
QRS Axis: 35 deg
QRS Duration: 80 ms
T Axis: 30 deg
Ventricular Rate: 60 /min

## 2013-06-05 ENCOUNTER — Other Ambulatory Visit: Payer: Self-pay | Admitting: Hematology & Oncology

## 2013-06-05 DIAGNOSIS — C187 Malignant neoplasm of sigmoid colon: Secondary | ICD-10-CM

## 2013-07-26 ENCOUNTER — Emergency Department
Admission: EM | Admit: 2013-07-26 | Discharge: 2013-07-26 | Disposition: A | Discharge status: Home / Self Care | Payer: Medicare Other | Attending: Emergency Medicine | Admitting: Emergency Medicine

## 2013-07-26 ENCOUNTER — Emergency Department: Payer: Medicare Other

## 2013-07-26 DIAGNOSIS — T07XXXA Unspecified multiple injuries, initial encounter: Secondary | ICD-10-CM | POA: Insufficient documentation

## 2013-07-26 DIAGNOSIS — R233 Spontaneous ecchymoses: Secondary | ICD-10-CM

## 2013-07-26 DIAGNOSIS — Y939 Activity, unspecified: Secondary | ICD-10-CM | POA: Insufficient documentation

## 2013-07-26 DIAGNOSIS — R110 Nausea: Secondary | ICD-10-CM

## 2013-07-26 DIAGNOSIS — Z8673 Personal history of transient ischemic attack (TIA), and cerebral infarction without residual deficits: Secondary | ICD-10-CM | POA: Insufficient documentation

## 2013-07-26 LAB — PT/INR
PT INR: 1 (ref 0.8–1.2)
PT: 11.1 s (ref 9.4–12.5)

## 2013-07-26 LAB — CBC AND DIFFERENTIAL
Basophils %: 1 % (ref 0.0–3.0)
Basophils Absolute: 0.1 10*3/uL (ref 0.0–0.3)
Eosinophils %: 1.8 % (ref 0.0–7.0)
Eosinophils Absolute: 0.1 10*3/uL (ref 0.0–0.8)
Hematocrit: 39.2 % (ref 36.0–48.0)
Hemoglobin: 13.8 gm/dL (ref 12.0–16.0)
Lymphocytes Absolute: 1.6 10*3/uL (ref 0.6–5.1)
Lymphocytes: 29.4 % (ref 15.0–46.0)
MCH: 31 pg (ref 28–35)
MCHC: 35 gm/dL (ref 31–36)
MCV: 89 fL (ref 80–100)
MPV: 6.9 fL (ref 6.0–10.0)
Monocytes Absolute: 0.4 10*3/uL (ref 0.1–1.7)
Monocytes: 7.7 % (ref 3.0–15.0)
Neutrophils %: 60 % (ref 42.0–78.0)
Neutrophils Absolute: 3.3 10*3/uL (ref 1.7–8.6)
PLT CT: 196 10*3/uL (ref 130–440)
RBC: 4.39 10*6/uL (ref 3.80–5.00)
RDW: 11.8 % (ref 10.5–14.5)
WBC: 5.4 10*3/uL (ref 4.00–11.00)

## 2013-07-26 LAB — LIPASE: Lipase: 9 U/L (ref 8–78)

## 2013-07-26 LAB — TSH: TSH: 0.74 u[IU]/mL (ref 0.40–4.20)

## 2013-07-26 MED ORDER — TRAMADOL HCL 50 MG PO TABS
50.0000 mg | ORAL_TABLET | Freq: Four times a day (QID) | ORAL | Status: DC | PRN
Start: 2013-07-26 — End: 2013-09-13

## 2013-07-26 MED ORDER — ONDANSETRON 4 MG PO TBDP
4.0000 mg | ORAL_TABLET | Freq: Three times a day (TID) | ORAL | Status: DC | PRN
Start: 2013-07-26 — End: 2013-09-13

## 2013-07-26 NOTE — ED Provider Notes (Addendum)
[facility]   EMERGENCY DEPARTMENT HISTORY AND PHYSICAL EXAM    Date: 07/26/2013  Patient Name: Tamara Bautista  Attending Physician: bMarchelle Folks, MD  Patient DOB:  [birth year]  MRN:  [id number]  Room:  ED4/ED4-A    Patient was evaluated by ED physician,B. Marchelle Folks, MD at 11:43 AM    History     Chief Complaint   Patient presents with   . Bleeding/Bruising   . Back Pain   . Knee Pain   . Nausea       HPI:  The patient [facility], is a 58 y.o. female who presents with chief complaint of starting yesterday has noticed bruising without any known trauma  Also states feels nausea  Has several year history of left knee pain without any change or new injury  No fever or chills  No diarrhea  No HA  No other complaints    Location  Severity  Duration  Radiation  Character  Onset  Modifying  Associated Sx     PCP:  Buettin, Dierdre Searles, DO (General)      Past Medical History       Past Medical History   Diagnosis Date   . Cancer    . Anxiety    . Depression    . Stroke      L sided weakness         Past Surgical History       Past Surgical History   Procedure Laterality Date   . Colon surgery     . Hysterectomy     . Liver resection       liver resection for cancer   . Knee arthroscopy       Left knee   . Colonoscopy  07/16/2012     Procedure: COLONOSCOPY;  Surgeon: Noemi Chapel, MD;  Location: [facility];  Service: General;  Laterality: N/A;         Family History    History reviewed. No pertinent family history.    Social History    History     Social History   . Marital Status: Single     Spouse Name: N/A     Number of Children: N/A   . Years of Education: N/A     Social History Main Topics   . Smoking status: Never Smoker    . Smokeless tobacco: None   . Alcohol Use: Yes      Comment: occasionally   . Drug Use: No   . Sexual Activity: None     Other Topics Concern   . None     Social History Narrative       Allergies    No Known Allergies      Current/Home  Medications    Current/Home Medications    FLUOXETINE (PROZAC) 20 MG TABLET    Take 20 mg by mouth daily.    GABAPENTIN (NEURONTIN) 600 MG TABLET    Take 600 mg by mouth 3 (three) times daily.    HYDROCODONE-ACETAMINOPHEN (NORCO) 7.5-325 MG PER TABLET    Take 1 tablet by mouth every 6 (six) hours as needed for Pain.    IBUPROFEN (ADVIL,MOTRIN) 600 MG TABLET    Take 600 mg by mouth every 6 (six) hours as needed.    METHYLPREDNISOLONE (MEDROL DOSPACK) 4 MG TABLET    follow package directions    OXYCODONE-ACETAMINOPHEN (PERCOCET) 5-325 MG PER TABLET    Take 1-2 tablets by mouth every 6 (six)  hours as needed for Pain.       Vital Signs     BP 125/90 mmHg  Pulse 69  Temp(Src) 97.5 F (36.4 C)  Resp 16  Ht 1.753 m  Wt 92.5 kg  BMI 30.10 kg/m2  SpO2 98%  Patient Vitals for the past 24 hrs:   BP Temp Pulse Resp SpO2 Height Weight   07/26/13 1136 125/90 mmHg 97.5 F (36.4 C) 69 16 98 % 1.753 m 92.5 kg         Review of Systems     Constitutional: No fever or weight loss.    Head: No headache    Eyes: No eye redness or pain    ENT: No ear pain  No sore throat    Cardiovascular: no c/o chest pain.  No DOE.  No orthopnea    Respiratory: No cough or shortness of breath.    GI: No vomiting.  No diarrhea. No abd pain   P[os nausea    Genitourinary:no frequency, urgency, pain, or burning    Musculoskeletal:no pain, no cyanosis, no edema, no symptoms of DVT    Skin: no rashes or skin lesions.  States bruising    Neurologic:no c/o weakness or paresthesia, no change in speech or vision    All other systems reviewed and negative      Physical Exam     CONSTITUTIONAL:  Patient is afebrile, Vital signs reviewed, Well appearing,  Patient appears comfortable, Alert and oriented X 3    HEAD:   Atraumatic, Normocephalic.    EENT: Normal    NECK   Normal ROM,  Cervical spine nontender.  No meningeal signs    RESPIRATORY / CHEST:   Nontender,  Breath sounds normal, No respiratory distress.    CARDIOVASCULAR:   RRR, No murmur. No  JVD    ABDOMEN: Soft   Nontender,  No peritoneal signs.   BS +  No masses    UPPER EXTREMITY:   Inspection normal, No cyanosis. Normal ROM    LOWER EXTREMITY:   Inspection normal, No cyanosis. No edema. No sign of DVT  Normal ROM  C/o left knee pain on ROM  No redness or effusion    NEURO: Normal motor, sensory, DTR all ext,  CNs intact, Speech normal, Mentation normal    SPINE: No point tenderness  No redness  No STS    SKIN:  Warm,  Dry, Normal color, No rashes  No petechea    pos Scattered bruises    LYMPHATIC:   No adenopathy noted      ED Medication Orders     ED Medication Orders    None          Orders Placed During this Encounter     Orders Placed This Encounter   Procedures   . CBC and differential   . UA w/Micro,Reflex Comprehensive Urine Cx   . Comprehensive metabolic panel   . Prothrombin time/INR   . TSH   . Lipase       Diagnostic Study Results     Labs     Results    Procedure Component Value Units Date/Time    TSH [[id number]] Collected:  07/26/13 1210    Specimen Information:  Blood / Plasma Updated:  07/26/13 1258     Thyroid Stimulating Hormone 0.74 uIU/mL     Comprehensive metabolic panel [[id number]] Collected:  07/26/13 1210    Specimen Information:  Blood / Plasma Updated:  07/26/13 1242  Sodium 143 mMol/L      Potassium 3.6 mMol/L      Chloride 109 mMol/L      CO2 25.0 mMol/L      CALCIUM 8.9 mg/dL      Glucose 92 mg/dL      Creatinine 2.44 mg/dL      BUN 9 mg/dL      Protein, Total 6.7 gm/dL      Alkaline Phosphatase 83 U/L      ALT 17 U/L      AST (SGOT) 18 U/L      Bilirubin, Total 0.7 mg/dL      Anion Gap 01.0 mMol/L      BUN/Creatinine Ratio 11.3 Ratio      EGFR >60 mL/min/1.51m2      Osmolality Calc 283 mOsm/kg     Lipase [[id number]] Collected:  07/26/13 1210    Specimen Information:  Blood / Plasma Updated:  07/26/13 1242     Lipase 9 U/L     Prothrombin time/INR [[id number]] Collected:  07/26/13 1210    Specimen Information:  Blood / Blood Updated:  07/26/13 1230     PT 11.1 sec       PT INR 1.0     CBC and differential [[id number]] Collected:  07/26/13 1210    Specimen Information:  Blood / Blood Updated:  07/26/13 1220     WBC 5.4 K/cmm      RBC 4.39 M/cmm      Hemoglobin 13.8 gm/dL      Hematocrit 29.5 %      MCV 89 fL      MCH 31 pg      MCHC 35 gm/dL      RDW 18.8 %      PLT CT 196 K/cmm      MPV 6.9 fL      NEUTROPHIL % 60.0 %      Lymphocytes 29.4 %      Monocytes 7.7 %      Eosinophils % 1.8 %      Basophils % 1.0 %      Neutrophils Absolute 3.3 K/cmm      Lymphocytes Absolute 1.6 K/cmm      Monocytes Absolute 0.4 K/cmm      Eosinophils Absolute 0.1 K/cmm      BASO Absolute 0.1 K/cmm           Radiologic Studies  Radiology Results (24 Hour)    ** No results found for the last 24 hours. **      .    Clinical Course / MDM     Notes: prior to leaving, pt requested more pain pills   States out of her hydrocodone    Consults:       Data Review     Nursing records reviewed and agree: Yes    Pulse Oximetry Analysis - Normal  Laboratory results reviewed by EDP: Yes  Radiologic study results reviewed by EDP: NO    Rendering Provider: Beola Cord, MD      Monitors, EKG     Cardiac Monitor (interpreted by ED physician):      EKG (interpreted by ED physician):       Critical Care     Critical care exclusive of time spent performing procedures.    Total time:          Clinical Impression & Disposition     Clinical Impression:  1. Bruises easily    2. Nausea  Disposition  ED Disposition    Discharge [facility] discharge to home/self care.    Condition at disposition: Stable            Prescriptions    New Prescriptions    ONDANSETRON (ZOFRAN-ODT) 4 MG DISINTEGRATING TABLET    Take 1 tablet (4 mg total) by mouth every 8 (eight) hours as needed for Nausea.         Signed,  B. Marchelle Folks, MD  1:38 PM 07/26/2013                Beola Cord, MD  07/26/13 1336    Beola Cord, MD  07/26/13 1338    Beola Cord, MD  07/26/13 1401

## 2013-07-26 NOTE — Discharge Instructions (Signed)
REST  FLUIDS  ER FOR ANY NEW SYMPTOMS

## 2013-07-28 LAB — COMPREHENSIVE METABOLIC PANEL
ALT: 17 U/L (ref 0–55)
AST (SGOT): 18 U/L (ref 10–42)
Albumin/Globulin Ratio: 1.09 Ratio (ref 0.70–1.50)
Albumin: 3.5 gm/dL (ref 3.5–5.0)
Alkaline Phosphatase: 83 U/L (ref 40–145)
Anion Gap: 12.2 mMol/L (ref 7.0–18.0)
BUN / Creatinine Ratio: 11.3 Ratio (ref 10.0–30.0)
BUN: 9 mg/dL (ref 7–22)
Bilirubin, Total: 0.7 mg/dL (ref 0.1–1.2)
CO2: 25 mMol/L (ref 20.0–30.0)
Calcium: 8.9 mg/dL (ref 8.5–10.5)
Chloride: 109 mMol/L (ref 98–110)
Creatinine: 0.8 mg/dL (ref 0.60–1.20)
EGFR: >60 mL/min/{1.73_m2}
Globulin: 3.2 gm/dL (ref 2.0–4.0)
Glucose: 92 mg/dL (ref 70–99)
Osmolality Calc: 283 mOsm/kg (ref 275–300)
Potassium: 3.6 mMol/L (ref 3.5–5.3)
Protein, Total: 6.7 gm/dL (ref 6.0–8.3)
Sodium: 143 mMol/L (ref 136–147)

## 2013-09-04 ENCOUNTER — Encounter: Payer: Self-pay | Admitting: Internal Medicine

## 2013-09-04 DIAGNOSIS — Z96652 Presence of left artificial knee joint: Secondary | ICD-10-CM | POA: Insufficient documentation

## 2013-09-04 DIAGNOSIS — F32A Depression, unspecified: Secondary | ICD-10-CM | POA: Diagnosis present

## 2013-09-04 DIAGNOSIS — I639 Cerebral infarction, unspecified: Secondary | ICD-10-CM | POA: Diagnosis present

## 2013-09-05 ENCOUNTER — Inpatient Hospital Stay: Payer: Medicare Other | Admitting: Internal Medicine

## 2013-09-05 ENCOUNTER — Inpatient Hospital Stay
Admission: RE | Admit: 2013-09-05 | Discharge: 2013-09-13 | DRG: 945 | Disposition: A | Discharge status: Home Health | Payer: Medicare Other | Source: Ambulatory Visit | Attending: Internal Medicine | Admitting: Internal Medicine

## 2013-09-05 DIAGNOSIS — Z8673 Personal history of transient ischemic attack (TIA), and cerebral infarction without residual deficits: Secondary | ICD-10-CM

## 2013-09-05 DIAGNOSIS — F32A Depression, unspecified: Secondary | ICD-10-CM | POA: Diagnosis present

## 2013-09-05 DIAGNOSIS — F3289 Other specified depressive episodes: Secondary | ICD-10-CM | POA: Diagnosis present

## 2013-09-05 DIAGNOSIS — R5381 Other malaise: Secondary | ICD-10-CM | POA: Diagnosis present

## 2013-09-05 DIAGNOSIS — C787 Secondary malignant neoplasm of liver and intrahepatic bile duct: Secondary | ICD-10-CM | POA: Diagnosis present

## 2013-09-05 DIAGNOSIS — Z85038 Personal history of other malignant neoplasm of large intestine: Secondary | ICD-10-CM

## 2013-09-05 DIAGNOSIS — M2540 Effusion, unspecified joint: Secondary | ICD-10-CM | POA: Diagnosis present

## 2013-09-05 DIAGNOSIS — I639 Cerebral infarction, unspecified: Secondary | ICD-10-CM | POA: Diagnosis present

## 2013-09-05 DIAGNOSIS — Z5189 Encounter for other specified aftercare: Principal | ICD-10-CM

## 2013-09-05 DIAGNOSIS — Z96659 Presence of unspecified artificial knee joint: Secondary | ICD-10-CM

## 2013-09-05 DIAGNOSIS — Z9049 Acquired absence of other specified parts of digestive tract: Secondary | ICD-10-CM

## 2013-09-05 DIAGNOSIS — Z96652 Presence of left artificial knee joint: Secondary | ICD-10-CM

## 2013-09-05 LAB — CBC AND DIFFERENTIAL
Basophils %: 0.8 % (ref 0.0–3.0)
Basophils Absolute: 0.1 10*3/uL (ref 0.0–0.3)
Eosinophils %: 2.3 % (ref 0.0–7.0)
Eosinophils Absolute: 0.2 10*3/uL (ref 0.0–0.8)
Hematocrit: 31.4 % — ABNORMAL LOW (ref 36.0–48.0)
Hemoglobin: 10.8 gm/dL — ABNORMAL LOW (ref 12.0–16.0)
Lymphocytes Absolute: 2.1 10*3/uL (ref 0.6–5.1)
Lymphocytes: 26 % (ref 15.0–46.0)
MCH: 32 pg (ref 28–35)
MCHC: 34 gm/dL (ref 31–36)
MCV: 93 fL (ref 80–100)
MPV: 6.3 fL (ref 6.0–10.0)
Monocytes Absolute: 0.5 10*3/uL (ref 0.1–1.7)
Monocytes: 6 % (ref 3.0–15.0)
Neutrophils %: 65 % (ref 42.0–78.0)
Neutrophils Absolute: 5.2 10*3/uL (ref 1.7–8.6)
PLT CT: 200 10*3/uL (ref 130–440)
RBC: 3.39 10*6/uL — ABNORMAL LOW (ref 3.80–5.00)
RDW: 12.1 % (ref 10.5–14.5)
WBC: 8.1 10*3/uL (ref 4.00–11.00)

## 2013-09-05 LAB — BASIC METABOLIC PANEL
Anion Gap: 12.3 mMol/L (ref 7.0–18.0)
BUN / Creatinine Ratio: 17.5 Ratio (ref 10.0–30.0)
BUN: 14 mg/dL (ref 7–22)
CO2: 29 mMol/L (ref 20.0–30.0)
Calcium: 9 mg/dL (ref 8.5–10.5)
Chloride: 104 mMol/L (ref 98–110)
Creatinine: 0.8 mg/dL (ref 0.60–1.20)
EGFR: >60 mL/min/{1.73_m2}
Glucose: 84 mg/dL (ref 70–99)
Osmolality Calc: 280 mOsm/kg (ref 275–300)
Potassium: 4.4 mMol/L (ref 3.5–5.3)
Sodium: 141 mMol/L (ref 136–147)

## 2013-09-05 LAB — PT/INR
PT INR: 1.3 — ABNORMAL HIGH (ref 0.8–1.2)
PT: 13.7 s — ABNORMAL HIGH (ref 9.4–12.5)

## 2013-09-05 MED ORDER — ACETAMINOPHEN 650 MG RE SUPP
650.0000 mg | RECTAL | Status: DC | PRN
Start: 2013-09-05 — End: 2013-09-13

## 2013-09-05 MED ORDER — ONDANSETRON 4 MG PO TBDP
4.0000 mg | ORAL_TABLET | Freq: Three times a day (TID) | ORAL | Status: DC | PRN
Start: 2013-09-05 — End: 2013-09-13

## 2013-09-05 MED ORDER — WARFARIN SODIUM 2.5 MG PO TABS
2.5000 mg | ORAL_TABLET | Freq: Every day | ORAL | Status: AC
Start: 2013-09-05 — End: 2013-09-06
  Administered 2013-09-05 – 2013-09-06 (×2): 2.5 mg via ORAL

## 2013-09-05 MED ORDER — CELECOXIB 100 MG PO CAPS
200.0000 mg | ORAL_CAPSULE | Freq: Two times a day (BID) | ORAL | Status: DC
Start: 2013-09-05 — End: 2013-09-13
  Administered 2013-09-05 – 2013-09-13 (×16): 200 mg via ORAL

## 2013-09-05 MED ORDER — ACETAMINOPHEN 325 MG PO TABS
650.0000 mg | ORAL_TABLET | ORAL | Status: DC | PRN
Start: 2013-09-05 — End: 2013-09-13

## 2013-09-05 MED ORDER — WARFARIN SODIUM 2.5 MG PO TABS
2.5000 mg | ORAL_TABLET | Freq: Every day | ORAL | Status: AC
Start: 2013-09-08 — End: 2013-09-08
  Administered 2013-09-08: 2.5 mg via ORAL

## 2013-09-05 MED ORDER — WARFARIN SODIUM 1 MG PO TABS
1.0000 mg | ORAL_TABLET | Freq: Every day | ORAL | Status: AC
Start: 2013-09-07 — End: 2013-09-07
  Administered 2013-09-07: 5 mg via ORAL

## 2013-09-05 MED ORDER — OXYCODONE-ACETAMINOPHEN 5-325 MG PO TABS
1.0000 | ORAL_TABLET | Freq: Four times a day (QID) | ORAL | Status: DC | PRN
Start: 2013-09-05 — End: 2013-09-10
  Administered 2013-09-05 – 2013-09-10 (×8): 1 via ORAL

## 2013-09-05 MED ORDER — WARFARIN SODIUM 1 MG PO TABS
1.0000 mg | ORAL_TABLET | Freq: Every day | ORAL | Status: DC
Start: 2013-09-10 — End: 2013-09-13
  Administered 2013-09-10: 2.5 mg via ORAL
  Administered 2013-09-11 – 2013-09-12 (×2): 5 mg via ORAL

## 2013-09-05 MED ORDER — ACETAMINOPHEN 160 MG/5ML PO SOLN
650.0000 mg | ORAL | Status: DC | PRN
Start: 2013-09-05 — End: 2013-09-13

## 2013-09-05 MED ORDER — WARFARIN SODIUM 2.5 MG PO TABS
2.5000 mg | ORAL_TABLET | Freq: Every day | ORAL | Status: AC
Start: 2013-09-09 — End: 2013-09-09
  Administered 2013-09-09: 5 mg via ORAL

## 2013-09-05 MED ORDER — FLUOXETINE HCL 20 MG PO CAPS
20.0000 mg | ORAL_CAPSULE | Freq: Every day | ORAL | Status: DC
Start: 2013-09-05 — End: 2013-09-13
  Administered 2013-09-06 – 2013-09-13 (×8): 20 mg via ORAL

## 2013-09-05 MED ORDER — GABAPENTIN 300 MG PO CAPS
600.0000 mg | ORAL_CAPSULE | Freq: Three times a day (TID) | ORAL | Status: DC
Start: 2013-09-05 — End: 2013-09-13
  Administered 2013-09-05 – 2013-09-13 (×23): 600 mg via ORAL

## 2013-09-05 NOTE — Plan of Care (Signed)
Problem: Safety  Goal: Patient will be free from injury during hospitalization  Outcome: Progressing

## 2013-09-06 DIAGNOSIS — Z96659 Presence of unspecified artificial knee joint: Secondary | ICD-10-CM

## 2013-09-06 DIAGNOSIS — Z8505 Personal history of malignant neoplasm of liver: Secondary | ICD-10-CM

## 2013-09-06 DIAGNOSIS — R5381 Other malaise: Secondary | ICD-10-CM

## 2013-09-06 DIAGNOSIS — Z85038 Personal history of other malignant neoplasm of large intestine: Secondary | ICD-10-CM

## 2013-09-06 LAB — PT/INR
PT INR: 1.3 — ABNORMAL HIGH (ref 0.8–1.2)
PT: 13.6 s — ABNORMAL HIGH (ref 9.4–12.5)

## 2013-09-06 NOTE — H&P (Signed)
ADMISSION HISTORY AND PHYSICAL EXAM    Date Time: 09/06/2013 10:31 AM  Patient Name: Tamara Bautista  Attending Physician: Lemar Lofty, MD      History of Present Illness:   Tamara Bautista is a 58 y.o. female who presents to the hospital with subacute rehab admission after left total knee replacement.   Ms. 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 She had some issues with hypotension after surgery but that resolved. She is admitted here for rehab after the surgery. She states her pain is well controlled with current meds and denies any dyspnea or chest pain. She has no constipation.     Past Medical History:     Past Medical History   Diagnosis Date   . Anxiety    . Depression    . Stroke      L sided weakness   . Cancer 2010     colon and liver   - she was diagnosed with colon cancer with liver mets in 2010 - her oncologist is Dr. Barbaraann Share and she is s/p partial colectomy and liver resections in addition to chemotherapy. She had done well with that and is in remission. She had a follow up with Dr. Barbaraann Share a few months ago.     Past Surgical History:     Past Surgical History   Procedure Laterality Date   . Hysterectomy     . Liver resection       liver resection for cancer   . Knee arthroscopy       Left knee   . Colonoscopy  07/16/2012     Procedure: COLONOSCOPY;  Surgeon: Noemi Chapel, MD;  Location: [facility];  Service: General;  Laterality: N/A;   . Colon surgery         Family History:   History reviewed. No pertinent family history.    Social History:     History     Social History   . Marital Status: Single     Spouse Name: N/A     Number of Children: N/A   . Years of Education: N/A     Social History Main Topics   . Smoking status: Never Smoker    . Smokeless tobacco: Not on file   . Alcohol Use: Yes      Comment: occasionally   . Drug Use: No   . Sexual Activity: Not on file     Other Topics Concern   . Not on file     Social History  Narrative       Allergies:   No Known Allergies    Medications:     Prescriptions prior to admission   Medication Sig   . FLUoxetine (PROZAC) 20 MG tablet Take 20 mg by mouth daily.   Marland Kitchen gabapentin (NEURONTIN) 600 MG tablet Take 600 mg by mouth 3 (three) times daily.   Marland Kitchen oxyCODONE-acetaminophen (PERCOCET) 5-325 MG per tablet Take 1-2 tablets by mouth every 6 (six) hours as needed for Pain.   Marland Kitchen HYDROcodone-acetaminophen (NORCO) 7.5-325 MG per tablet Take 1 tablet by mouth every 6 (six) hours as needed for Pain.   Marland Kitchen ibuprofen (ADVIL,MOTRIN) 600 MG tablet Take 600 mg by mouth every 6 (six) hours as needed.   . methylPREDNIsolone (MEDROL DOSPACK) 4 MG tablet follow package directions   . ondansetron (ZOFRAN-ODT) 4 MG disintegrating tablet Take 1 tablet (4 mg total) by mouth every 8 (eight) hours as needed for Nausea.   Marland Kitchen  traMADol (ULTRAM) 50 MG tablet Take 1 tablet (50 mg total) by mouth every 6 (six) hours as needed for Pain (may take one or two).       Review of Systems:   Denies any fevers, no changes in appetite, no changes in weight  No chest pain no dsypnea  No abd pain, no changes in stools, no black or blood in stools  No new LE edema   No rash  All other systems reviewed and are negative    Physical Exam:     Filed Vitals:    09/06/13 0630   BP: 114/67   Pulse: 60   Temp: 98.1 F (36.7 C)   Resp: 17   SpO2: 96%       Intake and Output Summary (Last 24 hours) at Date Time    Intake/Output Summary (Last 24 hours) at 09/06/13 1031  Last data filed at 09/06/13 1610   Gross per 24 hour   Intake    100 ml   Output      0 ml   Net    100 ml       GENERAL: No acute distress Pt alert and oriented to person place and time  HEENT: oropharynx clear and moist; Pupils Equally Reactive to Light Accomidation / Equal occular movements intact   NECK: no noted lymphadenopathy, no bruits, no JVD   CV: S1 and S2 Regular rate and rhythem, no murmurs noted   CHEST: clear to ascultation bilaterally?  ABD: soft non-tender, no  distention x4, +bowel sounds   EXTR: warm, +2 dorsalis pedis bilaterally, no pitting edema bilaterally?  The left knee has clean healing incisions with staples. No drainage and no erythema and no difference in head between left and right knee.    PSYCH: normal affect, no pressured speech, no tangential thoughts       Labs:     Results    Procedure Component Value Units Date/Time    Daily PT/INR [[id number]]  (Abnormal) Collected:  09/06/13 0640    Specimen Information:  Blood / Blood Updated:  09/06/13 0702     PT 13.6 (H) sec      PT INR 1.3 (H)     Basic Metabolic Panel [[id number]] Collected:  09/05/13 1605    Specimen Information:  Blood / Plasma Updated:  09/05/13 1624     Sodium 141 mMol/L      Potassium 4.4 mMol/L      Chloride 104 mMol/L      CO2 29.0 mMol/L      CALCIUM 9.0 mg/dL      Glucose 84 mg/dL      Creatinine 5.62 mg/dL      BUN 14 mg/dL      Anion Gap 13.0 mMol/L      BUN/Creatinine Ratio 17.5 Ratio      EGFR >60 mL/min/1.83m2      Osmolality Calc 280 mOsm/kg     Baseline PT/INR (if not drawn in prior 24 hours) [[id number]]  (Abnormal) Collected:  09/05/13 1605    Specimen Information:  Blood / Blood Updated:  09/05/13 1623     PT 13.7 (H) sec      PT INR 1.3 (H)     CBC and differential [[id number]]  (Abnormal) Collected:  09/05/13 1605    Specimen Information:  Blood / Blood Updated:  09/05/13 1617     WBC 8.1 K/cmm      RBC 3.39 (L) M/cmm      Hemoglobin 10.8 (  L) gm/dL      Hematocrit 96.0 (L) %      MCV 93 fL      MCH 32 pg      MCHC 34 gm/dL      RDW 45.4 %      PLT CT 200 K/cmm      MPV 6.3 fL      NEUTROPHIL % 65.0 %      Lymphocytes 26.0 %      Monocytes 6.0 %      Eosinophils % 2.3 %      Basophils % 0.8 %      Neutrophils Absolute 5.2 K/cmm      Lymphocytes Absolute 2.1 K/cmm      Monocytes Absolute 0.5 K/cmm      Eosinophils Absolute 0.2 K/cmm      BASO Absolute 0.1 K/cmm             Rads:   Radiological Procedure reviewed.      Assessment/Plan:   58yoF s/p left total knee replacement.   1.  Debility - PT/OT    2. Continue current pain control.   3. Continue coumadin for DVT prophylaxis.   4. History of colon cancer with liver mets - in remission.     Signed by: Birdie Hopes

## 2013-09-06 NOTE — Plan of Care (Signed)
Problem: Pain  Goal: Patient's pain/discomfort is manageable  Outcome: Progressing

## 2013-09-06 NOTE — PT Eval Note (Signed)
[facility]    [facility]  [address]  [place], [place] [address]    Department of Rehabilitation  [phone]    Physical Therapy Swing Bed Evaluation    Tamara Bautista    CSN: [id number]    MEDICAL/SURGICAL   219/219-A    Time of treatment: Time Calculation  PT Received On: 09/06/13  Start Time: 1020  Stop Time: 1100  Time Calculation (min): 40 min    PT Visit Number: 1     Consult received for [name] for PT Evaluation and Treatment.  Patient's medical condition is appropriate for Physical therapy intervention at this time.    Precautions and Contraindications:   Precautions  Weight Bearing Status: LLE WBAT    History of Present Illness      Medical Diagnosis: History of knee replacement, total, left [V43.65]    Therapy Diagnosis:  S/p L TKR    Tamara Bautista is a 58 y.o. female admitted on 09/05/2013 s/p L TKR on 09-01-13 following 2 yrs of progressive pain and disability.      Patient Active Problem List   Diagnosis   . Colon cancer   . History of knee replacement, total, left   . Depression   . Stroke   . 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 Anxiety    . Depression    . Stroke      L sided weakness   . Cancer 2010     colon and liver      Past Surgical History   Procedure Laterality Date   . Hysterectomy     . Liver resection       liver resection for cancer   . Knee arthroscopy       Left knee   . Colonoscopy  07/16/2012     Procedure: COLONOSCOPY;  Surgeon: Noemi Chapel, MD;  [facility];  Service: General;  Laterality: N/A;   . Colon surgery           Social History:   Home Living Arrangements  Living Arrangements: Alone  Type of Home: House  Home Layout: One level;Stairs to enter without rails (add number in comment)    Prior Level of Function  Prior level of function: Independent with ADLs;Ambulates  independently    Subjective    Patient is agreeable to participation in the therapy session.     Patient Goal  Patient Goal:  (be able to work part time)Long term  Short term: Return home independently    Pain:  Functional Limits  Pain Level Best: 3  Pain Level Worst: 9  Pain Assessment  Pain Assessment: Numeric Scale (0-10)  Pain Score: 4-moderate pain         Objective:     Observation of Patient/Vital Signs  Patient is seated in a bedside chair with no medical equipment in place.      Vital Signs   BP Supine  mmHg   BP Sitting  mmHg   BP Standing  mmHg   BP c activity  mmHg   HR Supine  bpm   HR Sitting  bpm   HR standing  bpm   HR c activity  bpm   SpO2 at rest     SpO2 c activity  Edema: mild to moderate L knee effusion  Skin Inspection: healing incisions with staples   Sensation: intact    Cognition  Cognition  Arousal/Alertness: Appropriate responses to stimuli  Attention Span: Appears intact  Orientation Level: Oriented X4    Neuro Status       Musculoskeletal Examination       (TKA Only)  Knee ROM:   ROM:   L LE Extension in Supine: -4 degrees     L LE Flexion in Sitting: 85 degrees         Gross Strength  Left Lower Extremity Strength: 3-/5   L Quad -3/5; Hip grossly 4/5         Balance  Balance  Balance: within functional limits       Functional Mobility:   Bed mobility:  Rolling: Modified Independent  Supine to Sit: Minimal Assist  Scooting to HOB: Stand by Assist  Sit to Supine: Minimal Assist  Sit to Stand: Independent  Stand to Sit: Independent    Transfers  Bed to Chair: Supervision    Locomotion  Ambulation: Modified Independent  Ambulation Distance (Feet):  (100+ ft)  Pattern: Step to    Participation and Activity Tolerance  Participation and Endurance  Participation Effort: excellent                           Treatment Activities:   Transfer and gait training with FWW 75 ft x 2 with supervision and cueing.  There ex initiated and included sitting knee flex/ext; ankle pumps;  Supine A/A  heelslides, quadset, A/A SLR and A/A hip abd/add      Educated the patient to role of physical therapy, plan of care, goals of therapy, HEP, safe and proper use of assistive device, and use of call bell.        Pt positioned at conclusion of session: in bedside recliner    PT Team Communication:     RN notified of session outcome.     Recommend patient perform sitting and standing ex and amb with FWW outside of and in addition to PT session.      Assessment:      Tamara Bautista is a 58 y.o. female admitted 09/05/2013 s/p L TKR and demonstrated good post-op ROM, strength, pain control and functional mobility.    PT Impairment Decreased LE ROM;Decreased LE strength;Gait impairment.     Patient will benefit from skilled PT services for ther ex, transfer and gait training.     Rehabilitation Potential: Prognosis: Good    Risks/Benefits/POC Discussed with Pt/Family: With patient    Goals:   Goals  Goal Formulation: With patient  Time for Goal Acheivement: By time of discharge  Goals: Select goal  Pt Will Transfer Bed/Chair: independent  Pt Will Ambulate: > 200 feet;with single point cane  Pt Will Go Up / Down Stairs: 1 flight;modified independent  Pt Will Perform Home Exer Program: independent   LTG: AROM L knee will improve to 0 to 105 degrees       Plan:     Treatment/Interventions: Exercise;Gait training;LE strengthening/ROM     PT Frequency: 6-7x/wk     DISCHARGE RECOMMENDATIONS   DME Recommended for Discharge: Single point cane     Discharge Recommendation: Home with outpatient PT       Oren Section PT DPT Cert MDT

## 2013-09-07 LAB — COMPREHENSIVE METABOLIC PANEL
ALT: 29 U/L (ref 0–55)
AST (SGOT): 24 U/L (ref 10–42)
Albumin/Globulin Ratio: 0.9 Ratio (ref 0.70–1.50)
Albumin: 2.7 gm/dL — ABNORMAL LOW (ref 3.5–5.0)
Alkaline Phosphatase: 79 U/L (ref 40–145)
Anion Gap: 11.5 mMol/L (ref 7.0–18.0)
BUN / Creatinine Ratio: 15 Ratio (ref 10.0–30.0)
BUN: 12 mg/dL (ref 7–22)
Bilirubin, Total: 0.7 mg/dL (ref 0.1–1.2)
CO2: 30 mMol/L (ref 20.0–30.0)
Calcium: 8.8 mg/dL (ref 8.5–10.5)
Chloride: 103 mMol/L (ref 98–110)
Creatinine: 0.8 mg/dL (ref 0.60–1.20)
EGFR: >60 mL/min/{1.73_m2}
Globulin: 3 gm/dL (ref 2.0–4.0)
Glucose: 89 mg/dL (ref 70–99)
Osmolality Calc: 279 mOsm/kg (ref 275–300)
Potassium: 4.1 mMol/L (ref 3.5–5.3)
Protein, Total: 5.8 gm/dL — ABNORMAL LOW (ref 6.0–8.3)
Sodium: 140 mMol/L (ref 136–147)

## 2013-09-07 LAB — CBC AND DIFFERENTIAL
Basophils %: 0.5 % (ref 0.0–3.0)
Basophils Absolute: 0 10*3/uL (ref 0.0–0.3)
Eosinophils %: 2 % (ref 0.0–7.0)
Eosinophils Absolute: 0.1 10*3/uL (ref 0.0–0.8)
Hematocrit: 28.9 % — ABNORMAL LOW (ref 36.0–48.0)
Hemoglobin: 10.5 gm/dL — ABNORMAL LOW (ref 12.0–16.0)
Lymphocytes Absolute: 2 10*3/uL (ref 0.6–5.1)
Lymphocytes: 26.7 % (ref 15.0–46.0)
MCH: 33 pg (ref 28–35)
MCHC: 36 gm/dL (ref 31–36)
MCV: 90 fL (ref 80–100)
MPV: 7 fL (ref 6.0–10.0)
Monocytes Absolute: 0.5 10*3/uL (ref 0.1–1.7)
Monocytes: 6.6 % (ref 3.0–15.0)
Neutrophils %: 64.2 % (ref 42.0–78.0)
Neutrophils Absolute: 4.8 10*3/uL (ref 1.7–8.6)
PLT CT: 201 10*3/uL (ref 130–440)
RBC: 3.21 10*6/uL — ABNORMAL LOW (ref 3.80–5.00)
RDW: 11.9 % (ref 10.5–14.5)
WBC: 7.4 10*3/uL (ref 4.00–11.00)

## 2013-09-07 LAB — PT/INR
PT INR: 1.4 — ABNORMAL HIGH (ref 0.8–1.2)
PT: 15 s — ABNORMAL HIGH (ref 9.4–12.5)

## 2013-09-07 NOTE — Plan of Care (Signed)
Problem: Pain  Goal: Patient's pain/discomfort is manageable  Outcome: Progressing

## 2013-09-08 LAB — PT/INR
PT INR: 1.6 — ABNORMAL HIGH (ref 0.8–1.2)
PT: 17 s — ABNORMAL HIGH (ref 9.4–12.5)

## 2013-09-08 NOTE — Plan of Care (Signed)
Problem: Safety  Goal: Patient will be free from injury during hospitalization  Outcome: Progressing

## 2013-09-08 NOTE — UM Notes (Signed)
[facility] Utilization Management Review Sheet    NAMEBonetta Bautista [facility]  MR#: [id number]    CSN#: [id number]    ROOM: 219/219-A AGE: 58 y.o.    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 Anxiety    . Depression    . Stroke      L sided weakness   . Cancer 2010     colon and liver       DATE OF REVIEW: 09/08/2013    VITALS: BP 96/60 mmHg  Pulse 59  Temp(Src) 98.2 F (36.8 C) (Oral)  Resp 17  Ht 1.753 m (5\' 9" )  Wt 79.833 kg (176 lb)  BMI 25.98 kg/m2  SpO2 98%    UR NOTE 09/05/13-09/06/13:     58 y.o. female who presents to the hospital with subacute rehab admission after left total knee replacement.   Tamara Bautista had a total knee replacement on left due to severe OA. She had some issues with hypotension after surgery but that resolved. She is admitted here for rehab after the surgery. She states her pain is well controlled with current meds and denies any dyspnea or chest pain. She has no constipation.   Assessment/Plan:    58yoF s/p left total knee replacement.   1. Debility - PT/OT   2. Continue current pain control.   3. Continue coumadin for DVT prophylaxis.   4. History of colon cancer with liver mets - in remission.     PT NOTE 09/06/13:   58 y.o. female admitted 09/05/2013 s/p L TKR and demonstrated good post-op ROM, strength, pain control and functional mobility.    PT Impairment Decreased LE ROM;Decreased LE strength;Gait impairment.     Patient will benefit from skilled PT services for ther ex,  transfer and gait training.     Rehabilitation Potential: Prognosis: Good    Risks/Benefits/POC Discussed with Pt/Family: With patient    Goals:   Goals  Goal Formulation: With patient  Time for Goal Acheivement: By time of discharge  Goals: Select goal  Pt Will Transfer Bed/Chair: independent  Pt Will Ambulate: > 200 feet;with single point cane  Pt Will Go Up / Down Stairs: 1 flight;modified independent  Pt Will Perform Home Exer Program: independent   LTG: AROM L knee will improve to 0 to 105 degrees  Plan:      Treatment/Interventions: Exercise;Gait training;LE strengthening/ROM     PT Frequency: 6-7x/wk     VITALS: T 98.2, HR 60, R 16, BP 124/81, SATS 96%    DIAGNOSTICS: WBC 8.1, H/H 10.8/31.4; PT/INR 13.7/1.3; BMP WNL    ORDERS: SWING BED/TRANSITIONAL CARE SERVICES, FULL CODE, CELEBREX 200MG  BID, COUMADIN THERAPY WITH DAILY INR, PT/OT EVAL AND TREAT, VS EVERY 8 HOURS, OXYGEN PRN    MCG 19TH EDITION S-[id number] KNEE ARTHROPLASTY FOR Rehabilitation therapy treatments (PT, OT, or SLP) needed for 1 or more  of the following: Ongoing assessment of rehabilitation needs and potential (eg, range of motion, strength, balance)(6), Supervision of therapeutic exercises or activities to ensure patient safety and treatment effectiveness(2), Gait evaluation and training and Therapy modalities that require PT or OT observation to evaluate response

## 2013-09-08 NOTE — Plan of Care (Signed)
Problem: Pain  Goal: Patient's pain/discomfort is manageable  Outcome: Progressing

## 2013-09-08 NOTE — PT Progress Note (Addendum)
[facility]    [facility]  [address]  [place], [place] [address]    Department of Rehabilitation  [phone]    Physical Therapy Swing Bed Daily Note    Patient: Tamara Bautista [facility]    MRN#: [id number]     Time of treatment: Time Calculation  PT Received On: 09/08/13  Start Time: 1045  Stop Time: 1115  Time Calculation (min): 30 min    Precautions and Contraindications:   Precautions  Weight Bearing Status: LLE WBAT    Subjective:   Patient is agreeable to participation in the therapy session. Nursing clears patient for therapy.          Pain:     Pain Assessment  Pain Assessment: Numeric Scale (0-10)  Pain Score: 4-moderate pain  Pain Location: Knee  Pain Orientation: Left  Pain Intervention(s): Medication (See eMAR);Cold applied       Objective/Treatment Provided:     Therapeutic Exercise, Gait training with RW   L knee ext: -2 degrees  L knee flexion: 88 degrees    Assessment/Patient Response: Good; pt encouraged to keep journal of pain ratings and time of last pain pill in order to improve pain control.       Plan: Continue with POC     Alvino Chapel, DPT

## 2013-09-08 NOTE — Progress Notes (Signed)
58 year old female admitted to our transitional care/swing bed program from [facility] s/p knee replacement for continued physical/occupational therapy. Met with patient this afternoon to discuss discharge planning and transitional care paperwork.  All paperwork has been reviewed, signed, and placed on the chart.  Patient has been informed that she has 100/100 Medicare SNF days available.  Patient verbalizes understanding well.  Patient states that prior to her surgery she lived at home alone.  Patient states that she has a supportive son and sister that is able to provide any assistance that may be needed at time of discharge.  Patient states that she is able to perform her own personal care, cooks, cleans, does the household chores, continues to work part time and continues to drive.  Patient states that she does not use any type of adaptive equipment to assist with ambulation needs.  Patient states that at time of discharge her son or sister will provide transportation home via private vehicle.  We have discussed home health services and patient is agreeable to these services if needed.  At this time patient voices no concerns, however, case management will continue to monitor for interventions that may arise until discharge time.

## 2013-09-08 NOTE — OT Eval Note (Signed)
[facility]    [facility]  [address]  [place], [place] [address]    Department of Rehabilitation  [phone]      Occupational Therapy Swing Bed Evaluation    Tamara Bautista    CSN#: [id number]  MEDICAL/SURGICAL 219/219-A    Time of treatment: Time Calculation  OT Received On: 09/08/13  Start Time: 1110  Stop Time: 1120  Time Calculation (min): 10 min    Consult received for [facility] for OT Evaluation and Treatment.  Patient's medical condition is appropriate for Occupational therapy intervention at this time.    Precautions and Contraindications:  Precautions  Weight Bearing Status: LLE WBAT  Other Precautions: falls risk per epic guidelines    History of Present Illness:   Medical Diagnosis: History of knee replacement, total, left [V43.65]    Therapy Diagnosis:  Debility s/p TKR    Tamara Bautista is a 58 y.o. female admitted on 09/05/2013 for rehab s/p L TKR.  Pt is WBAT and is currently ind with her ADL and mobility in her room.    Patient Active Problem List   Diagnosis   . Colon cancer   . History of knee replacement, total, left   . Depression   . Stroke   . 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 Anxiety    . Depression    . Stroke      L sided weakness   . Cancer 2010     colon and liver      Past Surgical History   Procedure Laterality Date   . Hysterectomy     . Liver resection       liver resection for cancer   . Knee arthroscopy       Left knee   . Colonoscopy  07/16/2012     Procedure: COLONOSCOPY;  Surgeon: Noemi Chapel, MD;  [facility];  Service: General;  Laterality: N/A;   . Colon surgery           Social History:   Home Living Arrangements  Living Arrangements: Alone  Type of Home: House (2 steps to porch (no HR) then 1 step into house)  Home Layout: One level  Bathroom Shower/Tub: Tub/shower unit (with  curtain)  Bathroom Toilet: Standard  Home Living - Notes / Comments: laundry on main level    Prior Level of Function  Prior level of function: Independent with ADLs;Ambulates independently  Baseline Activity Level: Community ambulation  Driving: independent  Employment: Disabled (h/o CA- in remission)    Subjective:   Subjective: I did it myself" re: getting dressing this am    Patient Goal: return home    Pain:  Pain Assessment  Pain Assessment: Numeric Scale (0-10)  Pain Score: 4-moderate pain (L Knee)      Objective:     Observation of Patient/Vital Signs:    Patient is out of bed, ambulating with no medical equipment in place.    Cognitive Status and Neuro Exam:  Cognition  Comments: intact all areas    Neuro Status  Hand Dominance: mixed dominance  Safety Awareness: intact    Musculoskeletal Examination  Gross ROM  Right Upper Extremity ROM: within functional limits  Left Upper Extremity ROM: within functional limits    Gross Strength  Right Upper Extremity Strength: within functional limits  Left Upper Extremity Strength:  within functional limits    Sensory/Oculomotor Examination  Sensory  Visual Acuity: wears glasses    Activities of Daily Living:   Self-care and Home Management  Eating: Independent  Grooming: Independent (standing at sink including set up)  Bathing: Modified Independent (sponge bath seated / standing at sink; including set-up)  UB Dressing: Independent  LB Dressing: Modified Independent (pants, undergarments and slipper socks without AD)  Toileting: Modified Independent (with FWW)  Functional Transfers: Modified Independent (with FWW to from chair, bed and commode)  Item Retrieval:  (with FWW to retieve clothing in prep for am routine)    Functional Mobility:   Mobility and Transfers  Supine to Sit: Modified Independent  Bed to Chair: Modified Independent (wiht FWW)  Functional Mobility/Ambulation: Modified Independent (wiht FWW in room and hallway to therapy gym)       Balance  Balance  Static Sitting Balance: Independent  Static Standing Balance: Independent  Dynamic Standing Balance: Modified Independent (with FWW for item retrieval)    Participation and Activity Tolerance  Participation and Endurance  Participation Effort: excellent  Endurance: Tolerates 30+ min exercise without fatigue      Treatment Activities:       Eval and activity assessment completed this date  Educated the patient to role of occupational therapy, plan of care, goals of therapy and safety with mobility and ADLs.    Position at end of session: Client left in room with all needs in reach.    Team Communication:  Discussed anticipated short LOS with PT due to pt's high level of function.  Pt will be discussed during daily rounds and weekly d/c meetings during interdisciplinary team.    Assessment:      Tamara Bautista is a 58 y.o. female admitted 09/05/2013 for rehab prior to d/c home. Pt will benefit from training on IADL completion using a FWW as pt lives alone.  Patient's current impairments include: decreased independence with IADLs.  Patient will benefit from skilled OT services for  Education in safe tech in the kitchen for meal prep, home modifications, tub transfers and DME recommendations as well as AT for management of laundry post d/c.    Rehabilitation Potential: Prognosis: Good    Risks/benefits/POC discussed: patient    Goals:   STGs= LTGs  In 3 visits-    Pt will be ind with light meal prep, laundry management and tub transfers using AT, ED, DME prn.     Plan:     Treatment Interventions: ADL retraining;Functional transfer training;Patient/Family training;Equipment eval/education    OT Frequency Recommended: 2-3x/wk    DISCHARGE RECOMMENDATIONS   DME Recommended for Discharge: Shower chair    Discharge Recommendation: Home with no needs    Tamara Bautista, OTR/L

## 2013-09-08 NOTE — Progress Notes (Signed)
Nutrition Therapy  Nutrition Assessment    Patient Information:     Name:Tamara Bautista   Age: 58 y.o.   Sex: female     MRN: [id number]    Nutrition History:     Pt reports she ate breakfast, but does not care for the food? Aware that she can choose food items; she has lost 10 lbs in the last months in preparation for surgery?    Nutrition Diagnosis:     No nutrition diagnosis at this time      Intervention:     1. Continue with diet plan; Encourage to use low fat foods and whole grains (pt voices that she does not care for whole grain?)       Monitoring:  Evaluation:    PO/EN/PN intake:  % of meals   Labs:  H/H, albumin   GI Profile:  + BM 8/3    Nutrition Focused Physical:       Nutrition Risk Level: Low    Assessment Data:     Admission Dx:  History of knee replacement, total, left [V43.65]  PMH:  has a past medical history of Anxiety; Depression; Stroke; and Cancer (2010).  PSH:  has past surgical history that includes Hysterectomy; Liver resection; Knee arthroscopy; Colonoscopy (07/16/2012); and Colon surgery.     Height: 1.753 m (5\' 9" )   Weight: 79.833 kg (176 lb)   BMI: Body mass index is 25.98 kg/(m^2).   IBW: 66 kg    Pertinent Meds: As reviewed  Pertinent Labs:    Recent Labs  Lab 09/07/13  0529 09/05/13  1605   SODIUM 140 141   POTASSIUM 4.1 4.4   CHLORIDE 103 104   CO2 30.0 29.0   BUN 12 14   CREATININE 0.80 0.80   GLUCOSE 89 84   CALCIUM 8.8 9.0         Hand Grip Strength:      Diet Order:  Orders Placed This Encounter   Procedures   . Diet regular      Food intake: Percent Meals Eaten (%)  Avg: 33.3 %  Min: 5 %  Max: 95 %  GI symptoms:  Hydration:    Skin: Type of Wound (LDA): Incision Braden Scale 19, nutrition 3       Learning Needs:  No needs at this time    Estimated Needs:   25 - 28 kcal/kg IBW= 1600 - 1800 kcal/d  1.0 - 1.2 gm/kg IBW= 66 - 76 gm protein          Additional Comments:   As per policy    Leilani Merl, RD  09/08/2013 10:56 AM

## 2013-09-09 LAB — PT/INR
PT INR: 1.9 — ABNORMAL HIGH (ref 0.8–1.2)
PT: 21.1 s — ABNORMAL HIGH (ref 9.4–12.5)

## 2013-09-09 LAB — CA 125: CA 125: 4 U/mL (ref <35)

## 2013-09-09 NOTE — Plan of Care (Signed)
Problem: Safety  Goal: Patient will be free from injury during hospitalization  Outcome: Progressing

## 2013-09-09 NOTE — Plan of Care (Signed)
Problem: Pain  Goal: Patient's pain/discomfort is manageable  Outcome: Progressing

## 2013-09-09 NOTE — Progress Notes (Signed)
Patient has expressed that she would like to be discharged home on Thursday, September 11, 2013.  Therefore, a potential discharge has been planned for Thursday.  The Medicare Im Notice has been reviewed, signed, and placed on the chart.  The Freedom of Choice form has been reviewed, signed, and placed on the chart.  Patient has chosen [facility] for nursing and physical therapy needs.  A referral has been made at this time to [facility].  Patient has also chosen [facility] for rolling walker needs.  A referral has been made at this time to [facility].

## 2013-09-09 NOTE — Progress Notes (Signed)
Interdisciplinary team met to review patient's plan of care. Discussed goals, interventions, barriers and progress. Team members included: physician, physical/occupational therapy, nursing, registered dietitian, and applicable ancillary departments. Team will continue to monitor progress and report any concerns to physician and case management as indicated. Patients interdisciplinary care plan updated this date.

## 2013-09-09 NOTE — Progress Notes (Signed)
ROUNDING NOTE    Date Time: 09/09/2013 11:25 AM  Patient Name: Tamara Bautista  Attending Physician: Lemar Lofty, MD  Primary Care Physician: Lendon Ka, DO (General)    Primary Problem: History of knee replacement, total, left     She appears to be doing very well status post total knee replacement. Her pain appears to be well controlled and she is progressing rapidly and physical therapy. Range of motion is now from 0-94 and she is making very rapid progress. Her INR is in the goal range at the present time. She denies any chest pain or chest tightness. She reports no abdominal pain, nausea or vomiting. Appetite remains good.    Allergies:   No Known Allergies    Medications:     Current Facility-Administered Medications   Medication Dose Route Frequency   . celecoxib  200 mg Oral BID   . FLUoxetine  20 mg Oral Daily   . gabapentin  600 mg Oral TID   . warfarin  2.5-5 mg Oral Daily at 1800    Followed by   . [START ON 09/10/2013] warfarin  1-5 mg Oral Daily at 1800     acetaminophen **OR** acetaminophen **OR** acetaminophen, ondansetron, oxyCODONE-acetaminophen       Review of Systems:   All other systems were reviewed and are negative except: Some left leg swelling but otherwise she feels well    Physical Exam:   Patient Vitals for the past 24 hrs:   BP Temp Temp src Pulse Resp SpO2   09/09/13 0700 107/69 mmHg 98.1 F (36.7 C) Oral 55 17 98 %   09/09/13 0650 154/78 mmHg 98.2 F (36.8 C) - 75 17 98 %   09/08/13 2237 108/72 mmHg 98.3 F (36.8 C) Oral 60 17 100 %   09/08/13 1410 103/72 mmHg 98.2 F (36.8 C) Oral 52 17 99 %     Body mass index is 25.98 kg/(m^2).    Intake/Output Summary (Last 24 hours) at 09/09/13 1125  Last data filed at 09/09/13 0900   Gross per 24 hour   Intake    755 ml   Output      0 ml   Net    755 ml       General: awake, alert, oriented x 3; no acute distress.  HEENT: perrla, eomi, sclera anicteric  oropharynx clear without lesions,  Neck: supple, no  lymphadenopathy, no thyromegaly, no JVD  Cardiovascular: regular rate and rhythm, no  rubs or gallops  Lungs: clear to auscultation bilaterally, without wheezing, rhonchi, or rales  Abdomen: soft, non-tender, non-distended; no palpable masses, no hepatosplenomegaly, normoactive bowel sounds, no rebound or guarding  Extremities: no clubbing, cyanosis, 1+ edema on the left with incision site without redness or induration noted with staples still in place  Neuro: cranial nerves grossly intact, strength 5/5 in upper and lower extremities, sensation intact,     Labs:     Results    Procedure Component Value Units Date/Time    Daily PT/INR [[id number]]  (Abnormal) Collected:  09/09/13 0610    Specimen Information:  Blood / Blood Updated:  09/09/13 0637     PT 21.1 (H) sec      PT INR 1.9 (H)     CA 125 [[id number]] Collected:  09/07/13 0529    Specimen Information:  Blood / Blood Updated:  09/09/13 0335     CA 125 4 U/mL  Radiology:     Radiology Results (24 Hour)    ** No results found for the last 24 hours. **            Assessment:     Active Hospital Problems    Diagnosis   . 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 History of knee replacement, total, left   . Depression   . Stroke           Plan:   Overall she appears to be doing well with her left total knee replacement. She is therapeutic from a standpoint of her goal anticoagulation on current dosing of Coumadin. She is making rapid progress with physical therapy. Hopefully will Alice home soon with outpatient therapy.      Signed by: Lemar Lofty, MD

## 2013-09-09 NOTE — PT Progress Note (Signed)
[facility]    [facility]  [address]  [place], [place] [address]    Department of Rehabilitation  [phone]    Physical Therapy Swing Bed Daily Note    Patient: Kahlia Lagunes [facility]    MRN#: [id number]     Time of treatment: Time Calculation  PT Received On: 09/09/13  Start Time: 1010  Stop Time: 1115  Time Calculation (min): 65 min    Precautions and Contraindications:   Precautions  Weight Bearing Status: LLE WBAT    Subjective:   Patient is agreeable to participation in the therapy session. Nursing clears patient for therapy.          Pain:     Pain Assessment  Pain Assessment: Numeric Scale (0-10)  Pain Score: 4-moderate pain  Pain Location: Knee  Pain Orientation: Left  Pain Intervention(s): Medication (See eMAR);Cold applied;Repositioned       Objective/Treatment Provided:     Gait training with 4-wheeled walker, Therapeutic Exercise in supine, seated and standing, Group.  L knee -4 degrees ext; 94 degrees flexion     Assessment/Patient Response: Good      Plan: Continue with POC x 1-2 more visits then D/C home with homehealth PT for 1 week followed by outpatient. Nurse case manager notified of tentative D/C for this Thursday and need for a RW.     Alvino Chapel, DPT

## 2013-09-10 LAB — PT/INR
PT INR: 2.2 — ABNORMAL HIGH (ref 0.8–1.2)
PT: 24.5 s — ABNORMAL HIGH (ref 9.4–12.5)

## 2013-09-10 MED ORDER — OXYCODONE-ACETAMINOPHEN 5-325 MG PO TABS
1.0000 | ORAL_TABLET | ORAL | Status: DC | PRN
Start: 2013-09-10 — End: 2013-09-13
  Administered 2013-09-10 – 2013-09-12 (×5): 1 via ORAL

## 2013-09-10 NOTE — PT Progress Note (Signed)
[facility]    [facility]  [address]  [place], [place] [address]    Department of Rehabilitation  [phone]    Physical Therapy Swing Bed Daily Note    Patient: Tamara Bautista [facility]    MRN#: [id number]     Time of treatment: Time Calculation  PT Received On: 09/10/13  Start Time: 1010  Stop Time: 1115  Time Calculation (min): 65 min    Precautions and Contraindications:   Precautions  Weight Bearing Status: LLE WBAT    Subjective:   Patient is agreeable to participation in the therapy session. Nursing clears patient for therapy.          Pain:     Pain Assessment  Pain Assessment: Numeric Scale (0-10)  Pain Score: 4-moderate pain  Pain Location: Knee  Pain Orientation: Left  Pain Intervention(s): Medication (See eMAR);Cold applied;Repositioned       Objective/Treatment Provided:     Therapeutic Exercise, Gait training on stairs and with RW, Group, Cold pack     Assessment/Patient Response: Good!  L knee ROM: -2 degrees extension, 100 degrees flexion  Pt felt a pop when walking back to her room which was accompanied with increased pain; pt given cold pack and leg elevated- will continue to monitor.     Plan: Continue with POC x 1 more visit with D/C home anticipated for tomorrow.     Alvino Chapel, DPT

## 2013-09-10 NOTE — UM Notes (Signed)
UR NOTE 09/10/13:     Primary Problem: History of knee replacement, total, left   She appears to be doing very well status post total knee replacement. Her pain appears to be well controlled and she is progressing rapidly and physical therapy. Range of motion is now from 0-94 and she is making very rapid progress. Her INR is in the goal range at the present time. She denies any chest pain or chest tightness. She reports no abdominal pain, nausea or vomiting. Appetite remains good.  Plan:    Overall she appears to be doing well with her left total knee replacement. She is therapeutic from a standpoint of her goal anticoagulation on current dosing of Coumadin. She is making rapid progress with physical therapy. Hopefully will [place] home soon with outpatient therapy. Tentative discharge plan at this time is for 09/11/13- with home health SN and PT.    PT NOTE 09/09/13:     Subjective:   Patient is agreeable to participation in the therapy session. Nursing clears patient for therapy.     Pain:     Pain Assessment  Pain Assessment: Numeric Scale (0-10)  Pain Score: 4-moderate pain  Pain Location: Knee  Pain Orientation: Left  Pain Intervention(s): Medication (See eMAR);Cold applied;Repositioned       Objective/Treatment Provided:     Gait training with 4-wheeled walker, Therapeutic Exercise in supine, seated and standing, Group.  L knee -4 degrees ext; 94 degrees flexion     Assessment/Patient Response: Good     Plan: Continue with POC x 1-2 more visits then D/C home with homehealth PT for 1 week followed by outpatient. Nurse case manager notified of tentative D/C for this Thursday and need for a RW.     VITALS: T 98.4, HR 60, R 16, BP 105/61, SATS 98%    DIAGNOSTICS: PT/INR 24.5/2.2    ORDERS: SWING BED/TRANSITIONAL CARE SERVICES, FULL CODE, CELEBREX 200MG  BID, COUMADIN THERAPY WITH DAILY INR, PT/OT EVAL AND TREAT, VS EVERY 8 HOURS, OXYGEN PRN    MCG 19TH EDITION S-[id number] KNEE ARTHROPLASTY FOR Rehabilitation therapy treatments  (PT, OT, or SLP) needed for 1 or more of the following: Ongoing assessment of rehabilitation needs and potential (eg, range of motion, strength, balance)(6), Supervision of therapeutic exercises or activities to ensure patient safety and treatment effectiveness(2), Gait evaluation and training and Therapy modalities that require PT or OT observation to evaluate response

## 2013-09-10 NOTE — Plan of Care (Signed)
Problem: Pain  Goal: Patient's pain/discomfort is manageable  Outcome: Progressing

## 2013-09-10 NOTE — Plan of Care (Signed)
Problem: Safety  Goal: Patient will be free from injury during hospitalization  Outcome: Progressing

## 2013-09-11 LAB — PT/INR
PT INR: 1.8 — ABNORMAL HIGH (ref 0.8–1.2)
PT: 19.3 s — ABNORMAL HIGH (ref 9.4–12.5)

## 2013-09-11 NOTE — OT Progress Note (Signed)
[name]    [facility]  [address]  [place], [place] [address]    Department of Rehabilitation  [phone]    Occupational Therapy Swing Bed Discharge Summary    Tamara Bautista    CSN#: [id number]  MEDICAL/SURGICAL 219/219-A    Date:  09/08/13    Consult received for Tamara Bautista for OT Evaluation and Treatment.  Patient's medical condition continues to be appropriate for Occupational therapy intervention at this time.    Precautions and Contraindications:   Precautions  Weight Bearing Status: LLE WBAT    Medical Diagnosis: History of knee replacement, total, left [V43.65]    Therapy Diagnosis: Debility s/p TKR    Tamara Bautista is a 58 y.o. female admitted on 09/05/2013 for rehab s/p L TKR. Pt is WBAT and is currently ind with her ADL and mobility in her room.  Patient Active Problem List    Diagnosis    .  Colon cancer    .  History of knee replacement, total, left    .  Depression    .  Stroke    .  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  Anxiety     .  Depression     .  Stroke       L sided weakness    .  Cancer  2010      colon and liver           Past Surgical History      Social History:    Home Living Arrangements  Living Arrangements: Alone  Type of Home: House (2 steps to porch (no HR) then 1 step into house)  Home Layout: One level  Bathroom Shower/Tub: Tub/shower unit (with curtain)  Bathroom Toilet: Standard  Home Living - Notes / Comments: laundry on main level    Prior Level of Function  Prior level of function: Independent with ADLs;Ambulates independently  Baseline Activity Level: Community ambulation  Driving: independent  Employment: Disabled (h/o CA- in remission)    Observation of Patient/Vital Signs:   Patient is out of bed, ambulating with no medical equipment in place.    Cognitive Status and Neuro  Exam:  Cognition  Comments: intact all areas    Neuro Status  Hand Dominance: mixed dominance  Safety Awareness: intact    Musculoskeletal Examination  Gross ROM  Right Upper Extremity ROM: within functional limits  Left Upper Extremity ROM: within functional limits    Gross Strength  Right Upper Extremity Strength: within functional limits  Left Upper Extremity Strength: within functional limits    Sensory/Oculomotor Examination  Sensory  Visual Acuity: wears glasses    ACTIVITIES OF DAILY LIVING  es of Daily Living:    Self-care and Home Management  Eating: Independent  Grooming: Independent (standing at sink including set up)  Bathing: Modified Independent (sponge bath seated / standing at sink; including set-up)  UB Dressing: Independent  LB Dressing: Modified Independent (pants, undergarments and slipper socks without AD)  Toileting: Modified Independent (with FWW)  Functional Transfers: Modified Independent (with FWW to from chair, bed and commode)  Item Retrieval:  Modified Independent with FWW to retieve clothing in prep for am routine)      IADL:  Pt is modified independent with FWW for light meal prep,  light home management tasks, such as laundry, light cleaning.  Functional Mobility:      Functional Mobility  Mobility and Transfers  Supine to Sit: Modified Independent  Bed to Chair: Modified Independent (wiht FWW)  Functional Mobility/Ambulation: Modified Independent (wiht FWW in room and hallway to therapy gym)    Balance  Balance  Static Sitting Balance: Independent  Static Standing Balance: Independent  Dynamic Standing Balance: Modified Independent (with FWW for item retrieval)    Participation and Activity Tolerance  Participation and Endurance  Participation Effort: excellent  Endurance: Tolerates 30+ min exercise without fatigue    Treatment Activities:       Discussed discharge needs with patient and family today.  Position at end of session: Client left in room with all needs in reach.    Team  Communication: Discussed d/c to home tomorrow. Family assist prn.    Assessment:  Tamara Bautista is a 58 y.o. female admitted 09/05/2013 for rehab prior to d/c home. Pt has participated with therapy and has been independently completing self care and functional mobility with FWW.  She is modified independent as well with light meal prep and home management tasks. Is discharged from Occupational therapy effective today as she has met all goals.       Rehabilitation Potential: Prognosis: Good    Risks/benefits/POC discussed: patient    Goals:   STGs= LTGs In 3 visits-    Pt will be ind with light meal prep, laundry management and tub transfers using AT, ED, DME prn.  MET      DISCHARGE RECOMMENDATIONS   DME Recommended for Discharge: Shower chair    Discharge Recommendation: Home with no needs    Waldon Merl MS, OTR/L

## 2013-09-11 NOTE — Plan of Care (Signed)
Problem: Pain  Goal: Patient's pain/discomfort is manageable  Outcome: Progressing

## 2013-09-11 NOTE — Plan of Care (Signed)
Problem: Safety  Goal: Patient will be free from injury during hospitalization  Outcome: Progressing

## 2013-09-11 NOTE — PT Progress Note (Addendum)
[facility]    [facility]  [address]  [place], [place] [address]    Department of Rehabilitation  [phone]    Physical Therapy Swing Bed Daily Note    Patient: Tamara Bautista [facility]    MRN#: [id number]     Time of treatment: Time Calculation  PT Received On: 09/11/13  Start/Stop Time: 6213-0865  Start/Stop Time: 2:45-3:00    Time Calculation (min): 75 min TOTAL    Precautions and Contraindications:   Precautions  Weight Bearing Status: LLE WBAT    Subjective:   Patient is agreeable to participation in the therapy session. Nursing clears patient for therapy.          Pain:     Pain Assessment  Pain Assessment: Numeric Scale (0-10)  Pain Score: 4-moderate pain at rest; pain increases to severe with end range knee extension during ambulation (sharp pain at L heel-off in gait cycle)  Pain Location: Knee  Pain Orientation: Left  Pain Intervention(s): Medication (See eMAR);Cold applied;Other (Comment) (elevated)       Objective/Treatment Provided:     Therapeutic Exercise, Group, Cold pack, Gait training  -2 degrees L knee ext; 100 degrees L knee flexion     Assessment/Patient Response: Good- however pt continues to report pain L knee just left to tibial tuberosity which is mainly only painful with attempt to perform SLR in supine therefore this exercise deferred this date. Email send to MD regarding "pop" yesterday and pt's concern something may be wrong.     Plan: Continue with POC     Alvino Chapel, DPT

## 2013-09-12 ENCOUNTER — Inpatient Hospital Stay: Payer: Medicare Other

## 2013-09-12 DIAGNOSIS — M25569 Pain in unspecified knee: Secondary | ICD-10-CM

## 2013-09-12 LAB — PT/INR
PT INR: 1.5 — ABNORMAL HIGH (ref 0.8–1.2)
PT: 16.4 s — ABNORMAL HIGH (ref 9.4–12.5)

## 2013-09-12 MED ORDER — CALCIUM CARBONATE ANTACID 500 MG PO CHEW
1000.0000 mg | CHEWABLE_TABLET | Freq: Four times a day (QID) | ORAL | Status: DC | PRN
Start: 2013-09-12 — End: 2013-09-13
  Administered 2013-09-12: 1000 mg via ORAL

## 2013-09-12 NOTE — Plan of Care (Signed)
Problem: Safety  Goal: Patient will be free from injury during hospitalization  Outcome: Progressing

## 2013-09-12 NOTE — Progress Notes (Signed)
ROUNDING NOTE    Date Time: 09/12/2013 11:48 AM  Patient Name: Tamara Bautista  Attending Physician: Lemar Lofty, MD  Primary Care Physician: Lendon Ka, DO (General)    Primary Problem: History of knee replacement, total, left     She is continuing to have significant left knee pain. She was in therapy and felt a popping sensation and since that time has had issues with pain particularly with extension and weightbearing. The pain seems to be localized into the proximal fibula and lateral area of the left knee. She otherwise reports no specific pain elsewhere in the knee itself. She otherwise denies any worsened generalized swelling or bruising of the knee as well. She reports no chest pain or chest tightness. She reports no abdominal pain, nausea or vomiting.    Allergies:   No Known Allergies    Medications:     Current Facility-Administered Medications   Medication Dose Route Frequency   . celecoxib  200 mg Oral BID   . FLUoxetine  20 mg Oral Daily   . gabapentin  600 mg Oral TID   . warfarin  1-5 mg Oral Daily at 1800     acetaminophen **OR** acetaminophen **OR** acetaminophen, ondansetron, oxyCODONE-acetaminophen       Review of Systems:   All other systems were reviewed and are negative except: Left knee pain    Physical Exam:   Patient Vitals for the past 24 hrs:   BP Temp Temp src Pulse Resp SpO2   09/12/13 0650 91/62 mmHg 98.2 F (36.8 C) Oral 61 17 98 %   09/11/13 2223 102/62 mmHg 98.3 F (36.8 C) Oral 80 16 98 %   09/11/13 1555 136/82 mmHg 97.5 F (36.4 C) Oral 59 17 100 %     Body mass index is 25.98 kg/(m^2).    Intake/Output Summary (Last 24 hours) at 09/12/13 1148  Last data filed at 09/12/13 0928   Gross per 24 hour   Intake    375 ml   Output      0 ml   Net    375 ml       General: awake, alert, oriented x 3; no acute distress.  HEENT: perrla, eomi, sclera anicteric  oropharynx clear without lesions, mucous membranes moist  Neck: supple, no lymphadenopathy, no  thyromegaly, no JVD,  Cardiovascular: regular rate and rhythm, no murmurs, rubs or gallops  Lungs: clear to auscultation bilaterally, without wheezing, rhonchi, or rales  Abdomen: soft, non-tender, non-distended; no palpable masses, no hepatosplenomegaly, normoactive bowel sounds, no rebound or guarding  Extremities: no clubbing, cyanosis, 1+ edema in the left leg with staples in place but no redness or induration noted  Neuro: cranial nerves grossly intact, strength 5/5 in upper and lower extremities, sensation intact,   Skin: no rashes or lesions noted      Labs:     Results    Procedure Component Value Units Date/Time    Daily PT/INR [[id number]]  (Abnormal) Collected:  09/12/13 0525    Specimen Information:  Blood / Blood Updated:  09/12/13 0623     PT 16.4 (H) sec      PT INR 1.5 (H)             Radiology:     Radiology Results (24 Hour)    Procedure Component Value Units Date/Time    XR KNEE 4+ VIEWS LEFT [[id number]] Collected:  09/12/13 1105    Order Status:  Completed Updated:  09/12/13 1116  Narrative:      Clinical History:  increased pain , "pop" heard in PT    Examination:  AP, lateral and both oblique views of the left knee.    Comparison:  None available.    Findings:  Patient has had a total knee replacement. The bones and prosthesis are in good position. No fracture or malalignment.  There is a large joint effusion. Staples anterior to the knee.      Impression:      Recent knee replacement. Large joint effusion. No acute bony abnormality.    ReadingStation:[id number]            Assessment:     Active Hospital Problems    Diagnosis   . 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 History of knee replacement, total, left   . Depression   . Stroke           Plan:   I did repeat plain films of the left knee and her arthroplasty appears stable. She does have a joint effusion present which is not surprising. I'm a little concerned given the degree of discomfort that she is having particularly with  weightbearing. We'll discuss with her orthopedist. Otherwise she has been doing quite well at least up until this point. Her INR is slightly subtherapeutic and Coumadin dosing will need further adjustment.      Signed by: Lemar Lofty, MD

## 2013-09-12 NOTE — Plan of Care (Signed)
Problem: Health Promotion  Goal: Knowledge - disease process  Extent of understanding conveyed about a specific disease process.   Outcome: Progressing

## 2013-09-12 NOTE — PT Progress Note (Signed)
[facility]    Page [facility]  [address]  [place], [place] [address]    Department of Rehabilitation  [phone]    Physical Therapy Swing Bed Daily Note    Patient: Tamara Bautista [facility]    MRN#: [id number]     Time of treatment: Time Calculation  PT Received On: 09/12/13  Start Time: 1245  Stop Time: 1330  Time Calculation (min): 45 min    Precautions and Contraindications:   Precautions  Weight Bearing Status: LLE WBAT    Subjective:   Patient is agreeable to participation in the therapy session. Nursing clears patient for therapy.          Pain:     Pain Assessment  Pain Assessment: Numeric Scale (0-10)  Pain Score: 4-moderate pain  Pain Location: Knee  Pain Orientation: Left  Pain Intervention(s): Medication (See eMAR);Cold applied       Objective/Treatment Provided:     Therapeutic Exercise, Group, Cold pack with elevation post tx  L knee -4 degrees ext in supine during quad set, L knee flexion 104 degrees seated with AROM     Assessment/Patient Response: Still c/o lateral knee pain during SLR, heel-off phase of gait cycle and with turning leg inward. Per MD, x-ray shows large joint effusion. Educated pt to elevate and ice as often.     Plan: Pending return phone call from Dr. Freida Busman (ortho surgeon), pt may still return home today. If so, recommend home health PT for a short 1-week course followed by out-patient PT. RW has been obtained.     Alvino Chapel, DPT

## 2013-09-13 DIAGNOSIS — Z471 Aftercare following joint replacement surgery: Secondary | ICD-10-CM

## 2013-09-13 LAB — PT/INR
PT INR: 1.7 — ABNORMAL HIGH (ref 0.8–1.2)
PT: 18.1 s — ABNORMAL HIGH (ref 9.4–12.5)

## 2013-09-13 MED ORDER — ASPIRIN 325 MG PO TABS
325.0000 mg | ORAL_TABLET | Freq: Every day | ORAL | Status: AC
Start: 2013-09-13 — End: ?

## 2013-09-13 MED ORDER — OXYCODONE-ACETAMINOPHEN 5-325 MG PO TABS
1.0000 | ORAL_TABLET | ORAL | Status: DC | PRN
Start: 2013-09-13 — End: 2017-05-24

## 2013-09-13 NOTE — Discharge Instr - Diet (Signed)
Regular diet

## 2013-09-13 NOTE — Discharge Instr - Activity (Signed)
As tolerated

## 2013-09-13 NOTE — Progress Notes (Signed)
Pt given written and verbal discharge orders. Denies questions/needs. Verbalizes understanding. resp even and unlabored. No immediate distress noted. Pt remains without IV, has taken all personal belongings home with her. Family member at bedside. Pt taken to front door/ personal car via wheelchair by staff member. Pt d/c to home. [place] Sophronia Simas, RN

## 2013-09-13 NOTE — Plan of Care (Signed)
Goals met. Pt meets criteria for d/c home. Denies questions/needs.

## 2013-09-13 NOTE — Discharge Instructions (Signed)
Take an aspirin every day to thin blood and prevent blood clots. Take walks every day and stay active. If left leg swells below the knee, notify Dr. Tenny Craw.

## 2013-09-13 NOTE — PT Progress Note (Signed)
[facility]    [facility]  [address]  [place], [place] [address]    Department of Rehabilitation  [phone]    Physical Therapy Swing Bed Daily Note    Patient: Tamara Bautista [facility]    MRN#: [id number]     Time of treatment: Time Calculation  PT Received On: 09/13/13  Start Time: 0900  Stop Time: 0940  Time Calculation (min): 40 min    Precautions and Contraindications:   Precautions  Weight Bearing Status: LLE WBAT    Subjective:   Patient is agreeable to participation in the therapy session.          Pain:     Pain Assessment  Pain Assessment: Numeric Scale (0-10)  Pain Score: 4-moderate pain  Pain Location: Knee  Pain Orientation: Left       Objective/Treatment Provided:     Therapeutic exercise including NuStep and seated and supine LE exercises; STM to L proximal lateral gastroc and hamstring tendon     Assessment/Patient Response: Good - pt had AROM of 0-103 degrees and she tolerated exercises except for SLR which still causes an increase in pain. STM performed to address tissue restrictions and pain in the lateral proximal gastroc.     Plan: Continue with POC     Chancy Hurter, DPT

## 2013-09-13 NOTE — Plan of Care (Signed)
Problem: Safety  Goal: Patient will be free from injury during hospitalization  Outcome: Progressing

## 2013-09-13 NOTE — Discharge Summary (Signed)
HISTORY OF PRESENT ILLNESS: [name] is a lovely  58 year old, who I actually have known from the past.  I took  care of her mother and aunt several years ago and they are very  nice people.  She went for an elective knee surgery recently at  [facility] by Dr. Holli Humbles and came back to [facility] on July 31 and was admitted for swing bed, which has  gone quite well here eight days later and she is ready to go  home.  She had a popping sensation in her leg yesterday and had  x-rays which were negative.  Today, she is walking around with a  walker feeling well.  She has no swelling, very little pain.  Appetite, bowel, bladder function all are normal.  Her previous  history includes depression and a stroke and I reviewed all that  with her today.  She is on warfarin and her current INR is 1.7.  I do not particularly like this drug so I am going to go ahead  and put her on aspirin which is being used for clot prophylaxis  in hip and knee replacements by several orthopedists and I have  not seen any one get a DVT with postoperative aspirin.  She does  have regular Medicare and I am not sure they would cover a  non-warfarin anticoagulant such as Xarelto.  Anyway she is  comfortable enough to go home.  Her lungs are clear.  Heart  sounds are normal.  Belly is soft and nontender.  Her recent lab  work from 08/02 looks good.  Her albumin was 2.7 I see.  She  will take the aspirin and I told her to notify Dr. Tenny Craw should  her left lower extremity begin to swell below the knee.  She  will get an outpatient referral for physical therapy, follow up  with Dr. Freida Busman as scheduled and I have sent her home on the  following medications.    DISCHARGE MEDICATIONS: Her discharge med list includes:    1.  Aspirin 325 mg.  2.  Percocet 1 every 4 hours as needed for pain.  I gave      her 100 tablets.  3.  Fluoxetine 20 mg daily.  4.  Gabapentin 600 mg t.i.d.    She will stop:    1.  Hydrocodone.  2.  Tramadol.  3.   Zofran.  4.  I also saw ibuprofen, so we will take her off of that      while she is on aspirin.    FOLLOWUP: She will follow up with Dr. Tenny Craw as previously  scheduled.    FINAL DIAGNOSES:  1.  Status post left knee replacement for advanced      osteoarthritis left knee.  2.  History of depression.  3.  History of stroke.        [id number]  DD: 09/13/2013 14:15:42  DT: 09/13/2013 18:50:39  JOB: [id number]

## 2013-09-16 NOTE — PT Progress Note (Signed)
[facility]    [facility]  [address]  [place], [place] [address]    Department of Rehabilitation  [phone]    Physical Therapy Swing Bed Discharge Summary    Tamara Bautista [facility]    CSN: [id number]    MEDICAL/SURGICAL   219/219-A    Date: 09/13/13      History of Present Illness       Medical Diagnosis: History of knee replacement, total, left [V43.65]    Therapy Diagnosis: S/p L TKR    Tamara Bautista is a 58 y.o. female admitted on 09/05/2013 s/p L TKR on 09-01-13 following 2 yrs of progressive pain and disability.   Patient Active Problem List    Diagnosis    .  Colon cancer    .  History of knee replacement, total, left    .  Depression    .  Stroke    .  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  Anxiety     .  Depression     .  Stroke       L sided weakness    .  Cancer  2010      colon and liver           Past Surgical History    Past Surgical History    Procedure  Laterality  Date    .  Hysterectomy      .  Liver resection        liver resection for cancer    .  Knee arthroscopy        Left knee    .  Colonoscopy   07/16/2012      Procedure: COLONOSCOPY; Surgeon: Noemi Chapel, MD; [facility]: [facility]; Service: General; Laterality: N/A;    .  Colon surgery            Social History:    Home Living Arrangements  Living Arrangements: Alone  Type of Home: House  Home Layout: One level;Stairs to enter without rails (add number in comment)    Prior Level of Function  Prior level of function: Independent with ADLs;Ambulates independently  Subjective    SUBJECTIVE:   See daily note.        Objective:    OBJECTIVE:    Edema: mild to moderate L knee effusion  Skin Inspection: healing incisions with staples   Sensation: intact    Cognition  Cognition  Arousal/Alertness: Appropriate responses to stimuli  Attention Span: Appears  intact  Orientation Level: Oriented X4       Musculoskeletal Examination       (TKA Only) Knee ROM:  ROM:  L LE Extension in Supine: 0 degrees   L LE Flexion in Sitting: 103 degrees         Gross Strength  RLE: WNL  Left Lower Extremity Strength: 3-/5   L Quad 3-/5; Hip grossly 4/5      Balance  Balance  Balance: within functional limits     Bed mobility: (I)    Transfers: (I)    Locomotion  Ambulation: Modified Independent  Ambulation Distance (Feet): (300 ft)  Pattern: Step-to at times due to pain.     Participation and Activity Tolerance  Participation and Endurance  Participation Effort: excellent  Treatment Activities:   Initial evaluation, Gait training with RW, Therapeutic Exercise for ROM/strengthening, Cold pack post tx due to pain/edema, Group.     Educated the patient to role of physical therapy, plan of care, goals of therapy, HEP, safe and proper use of assistive device, and use of call bell.        Team Communication: whiteboard updated; discussed progress and D/C plan with interdisciplinary team.     Assessment:     ASSESSMENT:  Tamara Bautista is a 58 y.o. female admitted 09/05/2013 s/p L TKR. Pt made excellent gains however c/o abrupt onset of increased pain L knee joint after an audible pop during ambulation. MD aware and x-rays negative for fx however positive for large joint effusion. Regardless, pt has regained (I) with all functional mobility and pt is returning home today with recommendation to follow-up with outpatient PT.     Goals:   Goals  Goal Formulation: With patient  Time for Goal Acheivement: By time of discharge  Goals: Select goal  Pt Will Transfer Bed/Chair: independent (MET)  Pt Will Ambulate: > 200 feet;with single point cane (Not met- using walker due to knee pain; (I) with walker)  Pt Will Go Up / Down Stairs: 1 flight;modified independent (Not met/ not necessary to return home- able to climb 5 with MOD I)  Pt Will Perform Home Exer Program: independent   (MET)  LTG: AROM L knee will improve to 0 to 105 degrees (Not met- knee flexion  103)  Plan:    PLAN:  D/C inpatient PT.   DISCHARGE RECOMMENDATIONS    DME Recommended for Discharge: RW (obtained)  Discharge Recommendation: Home with outpatient PT    Lang Snow, DPT

## 2013-09-19 ENCOUNTER — Telehealth (RURAL_HEALTH_CENTER): Payer: Self-pay | Admitting: Internal Medicine

## 2013-09-19 NOTE — Telephone Encounter (Signed)
Call from lisa, [facility]  Pt was due inr today  Misty Stanley has tried multiple times to contact pt, even called emergency contact, deborah   Still have not heard back from pt  Visit has been moved to Sunday  Was told that pt wanted to be discharged and go to out patient

## 2013-09-21 NOTE — Telephone Encounter (Signed)
I don't know anything about this situation. Her listed PCP is not part of our practice so I'm not sure why this is coming to [place]? She should f/u w/ her PCP for INR monitoring and other needs.

## 2013-09-22 ENCOUNTER — Ambulatory Visit: Payer: Medicare Other | Attending: Orthopaedic Surgery

## 2013-09-22 DIAGNOSIS — IMO0001 Reserved for inherently not codable concepts without codable children: Secondary | ICD-10-CM | POA: Insufficient documentation

## 2013-09-22 DIAGNOSIS — Z96659 Presence of unspecified artificial knee joint: Secondary | ICD-10-CM | POA: Insufficient documentation

## 2013-09-22 DIAGNOSIS — M1710 Unilateral primary osteoarthritis, unspecified knee: Secondary | ICD-10-CM | POA: Insufficient documentation

## 2013-09-23 NOTE — Telephone Encounter (Signed)
FYI:  Spoke with lisa- pt was discharged from [facility] on Sunday  Pt on ASA

## 2013-10-07 ENCOUNTER — Ambulatory Visit: Payer: Medicare Other | Attending: Orthopaedic Surgery

## 2013-10-07 DIAGNOSIS — M25569 Pain in unspecified knee: Secondary | ICD-10-CM | POA: Insufficient documentation

## 2013-10-07 DIAGNOSIS — IMO0001 Reserved for inherently not codable concepts without codable children: Secondary | ICD-10-CM | POA: Insufficient documentation

## 2013-10-07 DIAGNOSIS — Z96659 Presence of unspecified artificial knee joint: Secondary | ICD-10-CM | POA: Insufficient documentation

## 2013-10-07 DIAGNOSIS — M1710 Unilateral primary osteoarthritis, unspecified knee: Secondary | ICD-10-CM | POA: Insufficient documentation

## 2013-11-06 ENCOUNTER — Ambulatory Visit: Payer: Medicare Other

## 2013-11-14 ENCOUNTER — Encounter (RURAL_HEALTH_CENTER): Payer: Self-pay

## 2013-11-28 ENCOUNTER — Ambulatory Visit
Admission: RE | Admit: 2013-11-28 | Discharge: 2013-11-28 | Disposition: A | Discharge status: Home / Self Care | Payer: Medicare Other | Source: Ambulatory Visit | Attending: Hematology & Oncology | Admitting: Hematology & Oncology

## 2013-11-28 ENCOUNTER — Other Ambulatory Visit: Payer: Self-pay | Admitting: Hematology & Oncology

## 2013-11-28 DIAGNOSIS — C787 Secondary malignant neoplasm of liver and intrahepatic bile duct: Secondary | ICD-10-CM | POA: Insufficient documentation

## 2013-11-28 DIAGNOSIS — C187 Malignant neoplasm of sigmoid colon: Secondary | ICD-10-CM

## 2013-11-28 DIAGNOSIS — Z9221 Personal history of antineoplastic chemotherapy: Secondary | ICD-10-CM | POA: Insufficient documentation

## 2013-11-28 DIAGNOSIS — K439 Ventral hernia without obstruction or gangrene: Secondary | ICD-10-CM | POA: Insufficient documentation

## 2013-11-28 MED ORDER — IOHEXOL 350 MG/ML IV SOLN
100.0000 mL | Freq: Once | INTRAVENOUS | Status: AC | PRN
Start: 2013-11-28 — End: 2013-11-28
  Administered 2013-11-28: 100 mL via INTRAVENOUS
  Filled 2013-11-28: qty 100

## 2013-11-28 MED ORDER — BARIUM SULFATE 2.1 % PO SUSP
450.0000 mL | Freq: Once | ORAL | Status: DC | PRN
Start: 2013-11-28 — End: 2013-11-29
  Filled 2013-11-28: qty 450

## 2013-12-04 ENCOUNTER — Other Ambulatory Visit: Payer: Self-pay | Admitting: Hematology & Oncology

## 2013-12-04 DIAGNOSIS — C187 Malignant neoplasm of sigmoid colon: Secondary | ICD-10-CM

## 2013-12-19 ENCOUNTER — Ambulatory Visit (INDEPENDENT_AMBULATORY_CARE_PROVIDER_SITE_OTHER): Payer: Medicare Other | Admitting: Surgery

## 2013-12-23 ENCOUNTER — Ambulatory Visit: Admit: 2013-12-23 | Payer: Self-pay | Admitting: Diagnostic Radiology

## 2013-12-23 SURGERY — MEDIPORT REMOVAL

## 2014-02-19 DIAGNOSIS — H35353 Cystoid macular degeneration, bilateral: Secondary | ICD-10-CM | POA: Diagnosis not present

## 2014-02-19 DIAGNOSIS — H2513 Age-related nuclear cataract, bilateral: Secondary | ICD-10-CM | POA: Diagnosis not present

## 2014-03-09 DIAGNOSIS — H2511 Age-related nuclear cataract, right eye: Secondary | ICD-10-CM | POA: Diagnosis not present

## 2014-03-09 DIAGNOSIS — H2513 Age-related nuclear cataract, bilateral: Secondary | ICD-10-CM | POA: Diagnosis not present

## 2014-03-13 DIAGNOSIS — H35353 Cystoid macular degeneration, bilateral: Secondary | ICD-10-CM | POA: Diagnosis not present

## 2014-03-17 ENCOUNTER — Encounter (RURAL_HEALTH_CENTER): Payer: Self-pay

## 2014-04-23 DIAGNOSIS — H26491 Other secondary cataract, right eye: Secondary | ICD-10-CM | POA: Diagnosis not present

## 2014-05-28 ENCOUNTER — Ambulatory Visit
Admission: RE | Admit: 2014-05-28 | Discharge: 2014-05-28 | Disposition: A | Discharge status: Home / Self Care | Payer: Medicare Other | Source: Ambulatory Visit | Attending: Hematology & Oncology | Admitting: Hematology & Oncology

## 2014-05-28 DIAGNOSIS — Z9049 Acquired absence of other specified parts of digestive tract: Secondary | ICD-10-CM | POA: Diagnosis not present

## 2014-05-28 DIAGNOSIS — C189 Malignant neoplasm of colon, unspecified: Secondary | ICD-10-CM | POA: Diagnosis not present

## 2014-05-28 DIAGNOSIS — C787 Secondary malignant neoplasm of liver and intrahepatic bile duct: Secondary | ICD-10-CM | POA: Diagnosis not present

## 2014-05-28 DIAGNOSIS — C187 Malignant neoplasm of sigmoid colon: Secondary | ICD-10-CM | POA: Diagnosis not present

## 2014-05-28 MED ORDER — IOHEXOL 350 MG/ML IV SOLN
100.0000 mL | Freq: Once | INTRAVENOUS | Status: AC | PRN
Start: 2014-05-28 — End: 2014-05-28
  Administered 2014-05-28: 100 mL via INTRAVENOUS
  Filled 2014-05-28: qty 100

## 2014-05-28 MED ORDER — BARIUM SULFATE 2.1 % PO SUSP
450.0000 mL | Freq: Once | ORAL | Status: AC | PRN
Start: 2014-05-28 — End: 2014-05-28
  Administered 2014-05-28: 450 mL via ORAL
  Filled 2014-05-28: qty 450

## 2014-06-10 ENCOUNTER — Ambulatory Visit
Admission: RE | Admit: 2014-06-10 | Discharge: 2014-06-10 | Disposition: A | Discharge status: Home / Self Care | Payer: Medicare Other | Source: Ambulatory Visit | Attending: Hematology & Oncology | Admitting: Hematology & Oncology

## 2014-06-10 ENCOUNTER — Other Ambulatory Visit: Payer: Self-pay | Admitting: Hematology & Oncology

## 2014-06-10 DIAGNOSIS — Z85038 Personal history of other malignant neoplasm of large intestine: Secondary | ICD-10-CM | POA: Diagnosis not present

## 2014-06-10 DIAGNOSIS — R634 Abnormal weight loss: Secondary | ICD-10-CM | POA: Diagnosis not present

## 2014-06-10 DIAGNOSIS — C189 Malignant neoplasm of colon, unspecified: Secondary | ICD-10-CM | POA: Diagnosis not present

## 2014-06-10 DIAGNOSIS — R1011 Right upper quadrant pain: Secondary | ICD-10-CM | POA: Diagnosis not present

## 2014-12-10 DIAGNOSIS — H40033 Anatomical narrow angle, bilateral: Secondary | ICD-10-CM | POA: Diagnosis not present

## 2014-12-10 DIAGNOSIS — H04123 Dry eye syndrome of bilateral lacrimal glands: Secondary | ICD-10-CM | POA: Diagnosis not present

## 2014-12-25 DIAGNOSIS — R634 Abnormal weight loss: Secondary | ICD-10-CM | POA: Diagnosis not present

## 2014-12-25 DIAGNOSIS — C189 Malignant neoplasm of colon, unspecified: Secondary | ICD-10-CM | POA: Diagnosis not present

## 2015-01-02 ENCOUNTER — Ambulatory Visit (INDEPENDENT_AMBULATORY_CARE_PROVIDER_SITE_OTHER): Payer: Medicare Other | Admitting: Family Medicine

## 2015-01-02 VITALS — BP 102/68 | HR 68 | Temp 98.0°F | Resp 18 | Ht 68.0 in | Wt 172.2 lb

## 2015-01-02 DIAGNOSIS — R05 Cough: Secondary | ICD-10-CM

## 2015-01-02 DIAGNOSIS — R52 Pain, unspecified: Secondary | ICD-10-CM | POA: Diagnosis not present

## 2015-01-02 DIAGNOSIS — R5381 Other malaise: Secondary | ICD-10-CM | POA: Diagnosis not present

## 2015-01-02 DIAGNOSIS — R059 Cough, unspecified: Secondary | ICD-10-CM

## 2015-01-02 DIAGNOSIS — R6883 Chills (without fever): Secondary | ICD-10-CM

## 2015-01-02 DIAGNOSIS — Z85038 Personal history of other malignant neoplasm of large intestine: Secondary | ICD-10-CM | POA: Diagnosis not present

## 2015-01-02 LAB — POCT CBC
Granulocyte percent: 73.6 %G (ref 37–80)
HCT, POC: 37.9 % (ref 37.7–47.9)
HEMOGLOBIN: 13 g/dL (ref 12.2–16.2)
LYMPH, POC: 1.8 (ref 0.6–3.4)
MCH, POC: 31.6 pg — AB (ref 27–31.2)
MCHC: 34.2 g/dL (ref 31.8–35.4)
MCV: 92.3 fL (ref 80–97)
MID (cbc): 0.2 (ref 0–0.9)
MPV: 7.4 fL (ref 0–99.8)
POC Granulocyte: 5.4 (ref 2–6.9)
POC LYMPH %: 24 % (ref 10–50)
POC MID %: 2.4 % (ref 0–12)
Platelet Count, POC: 214 10*3/uL (ref 142–424)
RBC: 4.11 M/uL (ref 4.04–5.48)
RDW, POC: 13.1 %
WBC: 7.4 10*3/uL (ref 4.6–10.2)

## 2015-01-02 LAB — POCT INFLUENZA A/B
Influenza A, POC: NEGATIVE
Influenza B, POC: NEGATIVE

## 2015-01-02 MED ORDER — BENZONATATE 100 MG PO CAPS: 100.0000 mg | ORAL_CAPSULE | Freq: Three times a day (TID) | ORAL | Status: DC | PRN

## 2015-01-02 MED ORDER — ALBUTEROL SULFATE 108 (90 BASE) MCG/ACT IN AEPB: 2.0000 | INHALATION_SPRAY | Freq: Four times a day (QID) | RESPIRATORY_TRACT | Status: DC | PRN

## 2015-01-02 NOTE — Progress Notes (Signed)
[facility] [address], [place] [address] [phone]  Date:  01/02/2015   Name:  Kathryn Zavala   DOB:  [birth year]   MRN:  [id number]  PCP:  No primary care provider on file.    Chief Complaint: Cough; Generalized Body Aches; Nasal Congestion; Chills; and itchy throat   History of Present Illness:  Kathryn Zavala is a 59 y.o. very pleasant female patient who presents with the following:  She has been sick for over a week- sx started with runny nose, nasal congestion, cough- can be productive. They have not noted a fever.  She has had some body aches and chills No vomiting, no diarrhea, she is able to eat ok She has been very tired Non- smoker Throat is scratchy, ears are congested but not painful  She did have colon cancer with mets to her liver.  She is in remission for about a year- not on any current treatment.   She has been treated in [place]; just moved to [place]  She finished her chemo and surgery, no radiation needed.   She used some vitamin C, OTC cold preps.    She is up coughing some at night.    She is working afternoons/ evening loading trucks  There are no active problems to display for this patient.   Past Medical History  Diagnosis Date  . Cancer ([place])   . Cataract     History reviewed. No pertinent past surgical history.  Social History  Substance Use Topics  . Smoking status: Never Smoker   . Smokeless tobacco: None  . Alcohol Use: 0.0 oz/week    0 Standard drinks or equivalent per week    History reviewed. No pertinent family history.  No Known Allergies  Medication list has been reviewed and updated.  No current outpatient prescriptions on file prior to visit.   No current facility-administered medications on file prior to visit.    Review of Systems:  As per HPI- otherwise negative.   Physical Examination: Filed Vitals:   01/02/15 0834  BP: 102/68  Pulse: 68  Temp: 98 F (36.7 C)   Resp: 18   Filed Vitals:   01/02/15 0834  Height: 5\' 8"  (1.727 m)  Weight: 172 lb 3.2 oz (78.109 kg)   Body mass index is 26.19 kg/(m^2). Ideal Body Weight: Weight in (lb) to have BMI = 25: 164.1  GEN: WDWN, NAD, Non-toxic, A & O x 3, looks well but tired HEENT: Atraumatic, Normocephalic. Neck supple. No masses, No LAD.  Bilateral TM wnl, oropharynx normal.  PEERL,EOMI.   Ears and Nose: No external deformity. CV: RRR, No M/G/R. No JVD. No thrill. No extra heart sounds. PULM: CTA B, no wheezes, crackles, rhonchi. No retractions. No resp. distress. No accessory muscle use. ABD: S, NT, ND. No rebound. No HSM.  Benign exam EXTR: No c/c/e NEURO Normal gait.  PSYCH: Normally interactive. Conversant. Not depressed or anxious appearing.  Calm demeanor.   Results for orders placed or performed in visit on 01/02/15  POCT CBC  Result Value Ref Range   WBC 7.4 4.6 - 10.2 K/uL   Lymph, poc 1.8 0.6 - 3.4   POC LYMPH PERCENT 24.0 10 - 50 %L   MID (cbc) 0.2 0 - 0.9   POC MID % 2.4 0 - 12 %M   POC Granulocyte 5.4 2 - 6.9   Granulocyte percent 73.6 37 - 80 %G   RBC 4.11 4.04 - 5.48 M/uL  Hemoglobin 13.0 12.2 - 16.2 g/dL   HCT, POC 37.9 37.7 - 47.9 %   MCV 92.3 80 - 97 fL   MCH, POC 31.6 (A) 27 - 31.2 pg   MCHC 34.2 31.8 - 35.4 g/dL   RDW, POC 13.1 %   Platelet Count, POC 214 142 - 424 K/uL   MPV 7.4 0 - 99.8 fL  POCT Influenza A/B  Result Value Ref Range   Influenza A, POC Negative Negative   Influenza B, POC Negative Negative     Assessment and Plan: Chills - Plan: POCT CBC, POCT Influenza A/B  Malaise  Body aches  History of colon cancer  Cough - Plan: benzonatate (TESSALON) 100 MG capsule, Albuterol Sulfate (PROAIR RESPICLICK) 123XX123 (90 BASE) MCG/ACT AEPB  Here today with illness for about one week- most consistent with viral URI Treat with supportive care and treat symptoms She will follow-up if not better in the next few days- she will call me Tessalon and albuterol as  needed for her sx   Signed Lamar Blinks, MD

## 2015-01-02 NOTE — Patient Instructions (Signed)
It does appear that you have a bad cold- your white blood cell count does not suggest a bacterial infection and your flu test is negative I think it is important to rest- this will help you get better! Use the albuterol inhaler as needed for cough and congestion- this should help your breathing Use the tessalon perles as needed for cough You also might try some mucinex for your nasal and sinus congestion  Let me know if you do not feel better in the next 2-3 days

## 2015-07-02 DIAGNOSIS — C189 Malignant neoplasm of colon, unspecified: Secondary | ICD-10-CM | POA: Diagnosis not present

## 2015-07-02 DIAGNOSIS — Z87891 Personal history of nicotine dependence: Secondary | ICD-10-CM | POA: Diagnosis not present

## 2015-07-03 ENCOUNTER — Ambulatory Visit
Admission: RE | Admit: 2015-07-03 | Discharge: 2015-07-03 | Disposition: A | Discharge status: Home / Self Care | Payer: Medicare Other | Source: Ambulatory Visit | Attending: Hematology & Oncology | Admitting: Hematology & Oncology

## 2015-07-03 DIAGNOSIS — C189 Malignant neoplasm of colon, unspecified: Secondary | ICD-10-CM | POA: Diagnosis not present

## 2015-07-06 ENCOUNTER — Encounter: Payer: Self-pay | Admitting: Hematology & Oncology

## 2015-07-06 DIAGNOSIS — C189 Malignant neoplasm of colon, unspecified: Secondary | ICD-10-CM

## 2015-07-08 ENCOUNTER — Ambulatory Visit (INDEPENDENT_AMBULATORY_CARE_PROVIDER_SITE_OTHER)
Admission: RE | Admit: 2015-07-08 | Discharge: 2015-07-08 | Disposition: A | Discharge status: Home / Self Care | Payer: Medicare Other | Source: Ambulatory Visit | Attending: Hematology & Oncology | Admitting: Hematology & Oncology

## 2015-07-08 ENCOUNTER — Encounter (INDEPENDENT_AMBULATORY_CARE_PROVIDER_SITE_OTHER): Payer: Self-pay

## 2015-07-08 DIAGNOSIS — C189 Malignant neoplasm of colon, unspecified: Secondary | ICD-10-CM | POA: Diagnosis not present

## 2015-07-08 MED ORDER — IOHEXOL 350 MG/ML IV SOLN
100.0000 mL | Freq: Once | INTRAVENOUS | Status: AC | PRN
Start: 2015-07-08 — End: 2015-07-08
  Administered 2015-07-08: 100 mL via INTRAVENOUS

## 2015-07-11 LAB — VH EXAMINATION FOR OVA AND PARASITES

## 2015-10-30 ENCOUNTER — Encounter (HOSPITAL_COMMUNITY): Payer: Self-pay | Admitting: Emergency Medicine

## 2015-10-30 ENCOUNTER — Ambulatory Visit (HOSPITAL_COMMUNITY)
Admission: EM | Admit: 2015-10-30 | Discharge: 2015-10-30 | Disposition: A | Discharge status: Home / Self Care | Payer: Medicare Other | Attending: Internal Medicine | Admitting: Internal Medicine

## 2015-10-30 DIAGNOSIS — M13162 Monoarthritis, not elsewhere classified, left knee: Secondary | ICD-10-CM | POA: Diagnosis not present

## 2015-10-30 MED ORDER — NAPROXEN 500 MG PO TABS: 500.0000 mg | ORAL_TABLET | Freq: Two times a day (BID) | ORAL | 0 refills | Status: DC

## 2015-10-30 MED ORDER — HYDROCODONE-ACETAMINOPHEN 5-325 MG PO TABS: 1.0000 | ORAL_TABLET | ORAL | 0 refills | Status: DC | PRN

## 2015-10-30 NOTE — ED Triage Notes (Signed)
Patient reports left knee total replacement in 2015.  Over the last month, patient has had left knee soreness.  Over the past week, patient's pain has increased significantly.  Patient works, which involves a lot of walking.  Patient has pain and visible swelling.  Patient reports pain is approximately 4 inches above knee to approximately 4 inches below knee.  Denies any new injury, no new work out or change in habits.

## 2015-10-30 NOTE — ED Provider Notes (Signed)
[facility]    CSN: [id number] Arrival date & time: 10/30/15  1211  First Provider Contact:  First MD Initiated Contact with Patient 10/30/15 1340        History   Chief Complaint Chief Complaint  Patient presents with  . Knee Pain    HPI Kathryn Zavala is a 60 y.o. female. She presents today with the gradual onset of pain and swelling in the left knee, which was replaced in 2015 by Dr. Zenia Resides in [place]. Has had increasing pain in the last 2-3 weeks, with swelling for about 10 days. Hurts constantly now for the last week. Previously, had mostly hurt with weightbearing. No new activities, no trauma. Does not recall a tick bite. No fever, no malaise. No rash. No other joint pains.  No history of difficulties postop from the knee replacement. Does have a history of colon cancer with liver metastases in 2010. Works at [employer], is on her feet all day, which seems to aggravate the knee discomfort. Mother has history of gout.    HPI  Past Medical History:  Diagnosis Date  . Cancer ([place])   . Cataract    Past Surgical History:  Procedure Laterality Date  . ABDOMINAL HYSTERECTOMY    . COLON SURGERY    . JOINT REPLACEMENT    . LIVER SURGERY      Home Medications    Prior to Admission medications   Medication Sig Start Date End Date Taking? Authorizing Provider  Albuterol Sulfate (PROAIR RESPICLICK) 123XX123 (90 BASE) MCG/ACT AEPB Inhale 2 puffs into the lungs 4 (four) times daily as needed. 01/02/15   Darreld Mclean, MD  Ascorbic Acid (VITA-C PO) Take by mouth.    Historical Provider, MD  gabapentin (NEURONTIN) 300 MG capsule Take 300 mg by mouth 3 (three) times daily.    Historical Provider, MD  Pseudoephedrine-Acetaminophen (ALKA-SELTZER PLUS COLD/SINUS PO) Take by mouth.    Historical Provider, MD    Family History      Mother:  gout  Social History Social History  Substance Use Topics  . Smoking status: Never Smoker  . Smokeless tobacco:  Never Used  . Alcohol use 0.0 oz/week     Allergies   Review of patient's allergies indicates no known allergies.   Review of Systems Review of Systems  All other systems reviewed and are negative.    Physical Exam Triage Vital Signs ED Triage Vitals  Enc Vitals Group     BP 10/30/15 1324 129/81     Pulse Rate 10/30/15 1324 64     Resp 10/30/15 1324 15     Temp 10/30/15 1324 97.9 F (36.6 C)     Temp Source 10/30/15 1324 Oral     SpO2 10/30/15 1324 100 %     Weight --      Height --      Pain Score 10/30/15 1339 9   Updated Vital Signs BP 129/81 (BP Location: Left Arm)   Pulse 64   Temp 97.9 F (36.6 C) (Oral)   Resp 15   SpO2 100%  Physical Exam  Constitutional: She is oriented to person, place, and time. No distress.  Alert, nicely groomed  HENT:  Head: Atraumatic.  Eyes:  Conjugate gaze, no eye redness/drainage  Neck: Neck supple.  Cardiovascular: Normal rate.   Pulmonary/Chest: No respiratory distress.  Abdominal: She exhibits no distension.  Musculoskeletal: Normal range of motion.  No leg swelling Left knee with a large anterior incision consistent  with previous knee replacement. Slight swelling/effusion compared to the right knee, minimal restriction of flexion/extension. Minimal tenderness to palpation. Slight warmth compared to palpation of the right knee. No erythema.  Neurological: She is alert and oriented to person, place, and time.  Skin: Skin is warm and dry.  No cyanosis  Nursing note and vitals reviewed.    UC Treatments / Results   Procedures Procedures (including critical care time)      None today   Final Clinical Impressions(s) / UC Diagnoses   Final diagnoses:  Monoarthritis of left knee   Gradual onset of swelling/pain/slight warmth in left knee, which has had a replacement in 2015, suggests inflammation.  Trial of an anti-inflammatory (naprosyn) medication; prescription sent to the [place] at [facility]  Prescription  for a few hydrocodone tablets (for emergencies) was printed.  Followup as above with an orthopedist for further evaluation and management of knee pain/swelling.    New Prescriptions New Prescriptions   HYDROCODONE-ACETAMINOPHEN (NORCO/VICODIN) 5-325 MG TABLET    Take 1 tablet by mouth every 4 (four) hours as needed.   NAPROXEN (NAPROSYN) 500 MG TABLET    Take 1 tablet (500 mg total) by mouth 2 (two) times daily.     Sherlene Shams, MD 11/01/15 [phone]

## 2015-10-30 NOTE — Discharge Instructions (Addendum)
Gradual onset of swelling/pain/slight warmth in left knee, which has had a replacement in 2015, suggests inflammation.  Trial of an anti-inflammatory (naprosyn) medication; prescription sent to the [place] at [facility].  Prescription for a few hydrocodone tablets (for emergencies) was printed.  Followup as above with an orthopedist for further evaluation and management of knee pain/swelling.

## 2015-11-10 DIAGNOSIS — M25562 Pain in left knee: Secondary | ICD-10-CM | POA: Diagnosis not present

## 2015-11-11 DIAGNOSIS — Z96652 Presence of left artificial knee joint: Secondary | ICD-10-CM | POA: Diagnosis not present

## 2015-11-11 DIAGNOSIS — M25562 Pain in left knee: Secondary | ICD-10-CM | POA: Diagnosis not present

## 2015-11-17 DIAGNOSIS — Z96652 Presence of left artificial knee joint: Secondary | ICD-10-CM | POA: Diagnosis not present

## 2015-11-17 DIAGNOSIS — M25562 Pain in left knee: Secondary | ICD-10-CM | POA: Diagnosis not present

## 2016-02-18 DIAGNOSIS — C189 Malignant neoplasm of colon, unspecified: Secondary | ICD-10-CM | POA: Diagnosis not present

## 2016-05-17 ENCOUNTER — Ambulatory Visit (INDEPENDENT_AMBULATORY_CARE_PROVIDER_SITE_OTHER): Payer: Medicare Other | Admitting: Surgery

## 2016-05-19 ENCOUNTER — Encounter (HOSPITAL_COMMUNITY): Payer: Self-pay | Admitting: Emergency Medicine

## 2016-05-19 ENCOUNTER — Emergency Department (HOSPITAL_COMMUNITY): Payer: Medicare Other

## 2016-05-19 ENCOUNTER — Emergency Department (HOSPITAL_COMMUNITY)
Admission: EM | Admit: 2016-05-19 | Discharge: 2016-05-19 | Disposition: A | Discharge status: Home / Self Care | Payer: Medicare Other | Attending: Emergency Medicine | Admitting: Emergency Medicine

## 2016-05-19 DIAGNOSIS — R079 Chest pain, unspecified: Secondary | ICD-10-CM | POA: Diagnosis not present

## 2016-05-19 DIAGNOSIS — Z79899 Other long term (current) drug therapy: Secondary | ICD-10-CM | POA: Diagnosis not present

## 2016-05-19 DIAGNOSIS — Z969 Presence of functional implant, unspecified: Secondary | ICD-10-CM | POA: Insufficient documentation

## 2016-05-19 DIAGNOSIS — Z87891 Personal history of nicotine dependence: Secondary | ICD-10-CM | POA: Insufficient documentation

## 2016-05-19 DIAGNOSIS — R072 Precordial pain: Secondary | ICD-10-CM | POA: Insufficient documentation

## 2016-05-19 DIAGNOSIS — Z85038 Personal history of other malignant neoplasm of large intestine: Secondary | ICD-10-CM | POA: Insufficient documentation

## 2016-05-19 LAB — BASIC METABOLIC PANEL
ANION GAP: 11 (ref 5–15)
BUN: 13 mg/dL (ref 6–20)
CALCIUM: 9.2 mg/dL (ref 8.9–10.3)
CO2: 26 mmol/L (ref 22–32)
Chloride: 103 mmol/L (ref 101–111)
Creatinine, Ser: 0.83 mg/dL (ref 0.44–1.00)
Glucose, Bld: 70 mg/dL (ref 65–99)
Potassium: 4.6 mmol/L (ref 3.5–5.1)
Sodium: 140 mmol/L (ref 135–145)

## 2016-05-19 LAB — I-STAT TROPONIN, ED
TROPONIN I, POC: 0 ng/mL (ref 0.00–0.08)
Troponin i, poc: 0 ng/mL (ref 0.00–0.08)

## 2016-05-19 LAB — CBC
HCT: 38.1 % (ref 36.0–46.0)
Hemoglobin: 12.9 g/dL (ref 12.0–15.0)
MCH: 31.4 pg (ref 26.0–34.0)
MCHC: 33.9 g/dL (ref 30.0–36.0)
MCV: 92.7 fL (ref 78.0–100.0)
Platelets: 203 10*3/uL (ref 150–400)
RBC: 4.11 MIL/uL (ref 3.87–5.11)
RDW: 12.6 % (ref 11.5–15.5)
WBC: 6.4 10*3/uL (ref 4.0–10.5)

## 2016-05-19 LAB — D-DIMER, QUANTITATIVE: D-Dimer, Quant: 0.59 ug{FEU}/mL — ABNORMAL HIGH (ref 0.00–0.50)

## 2016-05-19 IMAGING — CT CT ANGIO CHEST
2 of 8 series · 19 of 36 positions shown · IV contrast (isovue)
Comparison: Chest x-ray [DATE]

CLINICAL DATA: Chest pain at work today.  Shortness of breath.

EXAM:
CT ANGIOGRAPHY CHEST WITH CONTRAST
TECHNIQUE: Multidetector CT imaging of the chest was performed using the
standard protocol during bolus administration of intravenous
contrast. Multiplanar CT image reconstructions and MIPs were
obtained to evaluate the vascular anatomy.
CONTRAST:  100 cc Isovue 370

[Series 6: thins · axial · 0.59mm/px · z∈[+1198,+1452]mm · 18 of 284 slices shown]
[im 15/284  lung]
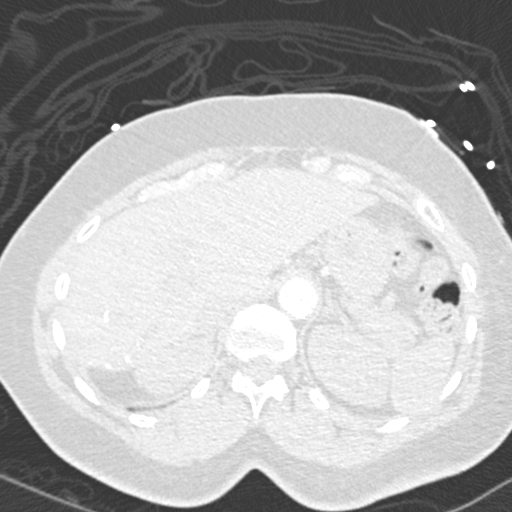
[im 30/284  mediastinal]
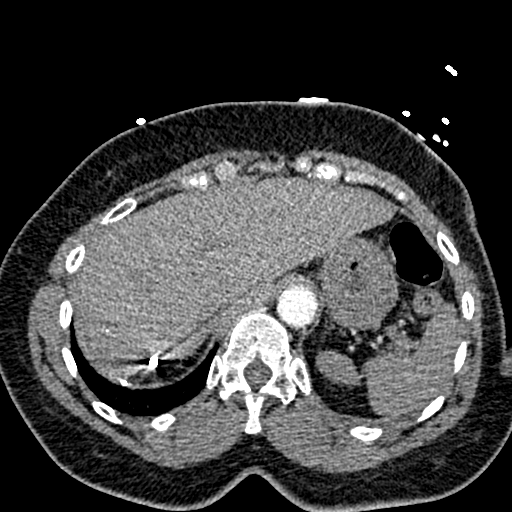
[im 45/284  lung]
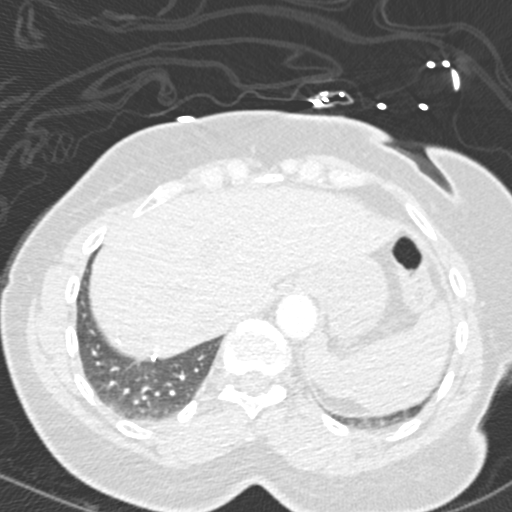
[im 60/284  mediastinal]
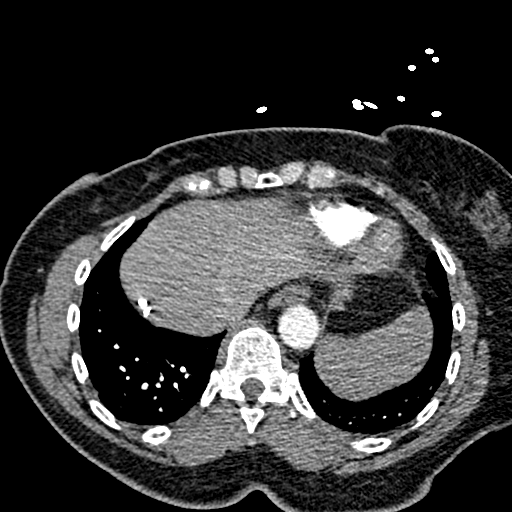
[im 75/284  lung]
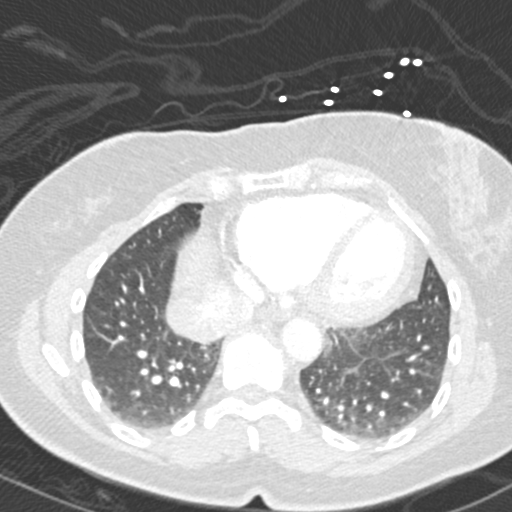
[im 90/284  mediastinal]
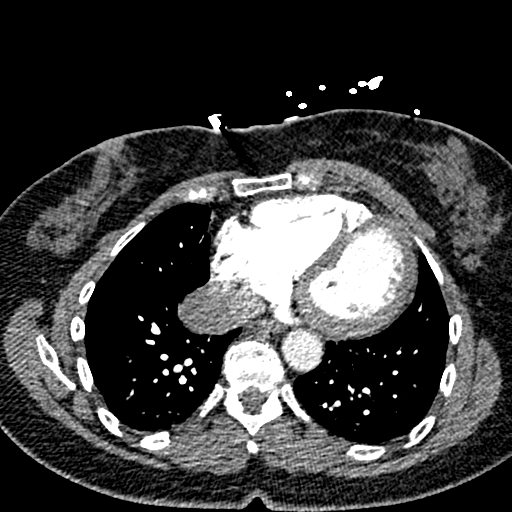
[im 105/284  lung]
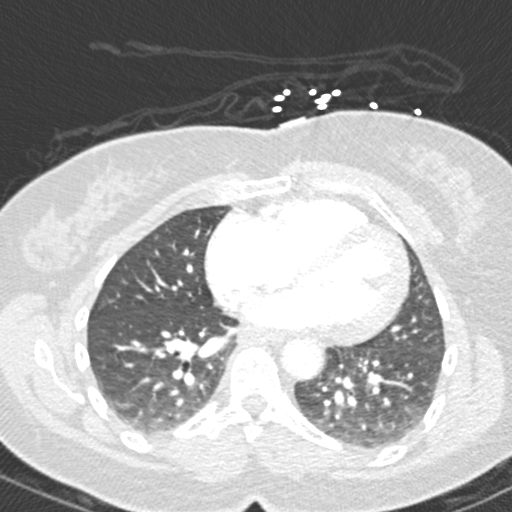
[im 120/284  mediastinal]
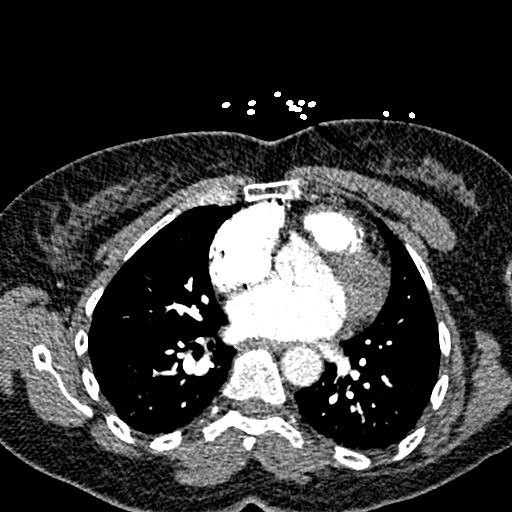
[im 135/284  lung]
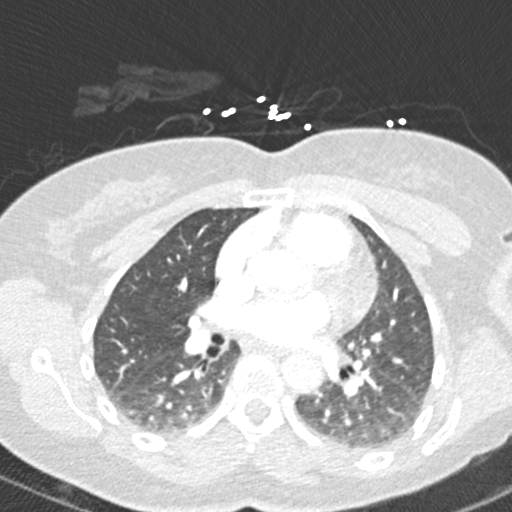
[im 149/284  mediastinal]
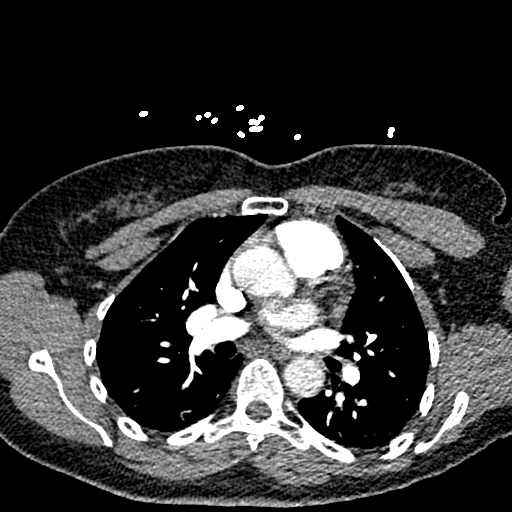
[im 164/284  lung]
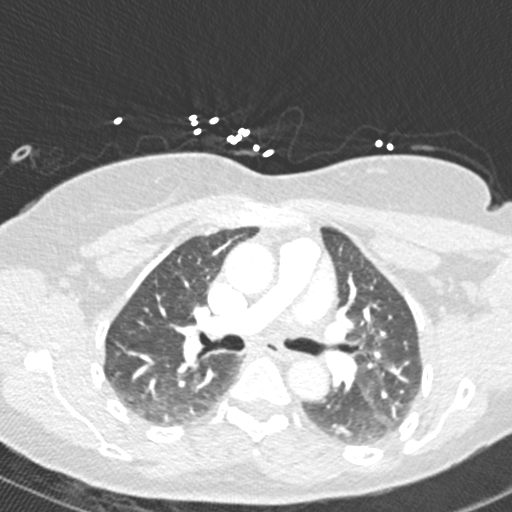
[im 179/284  mediastinal]
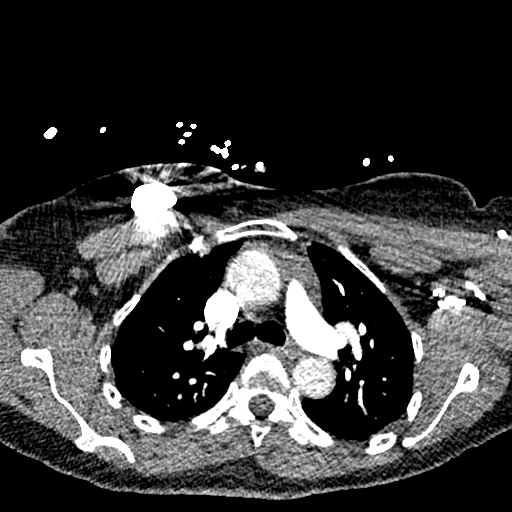
[im 194/284  lung]
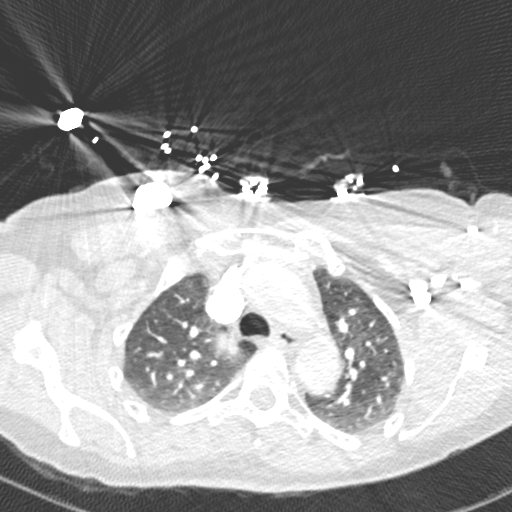
[im 209/284  mediastinal]
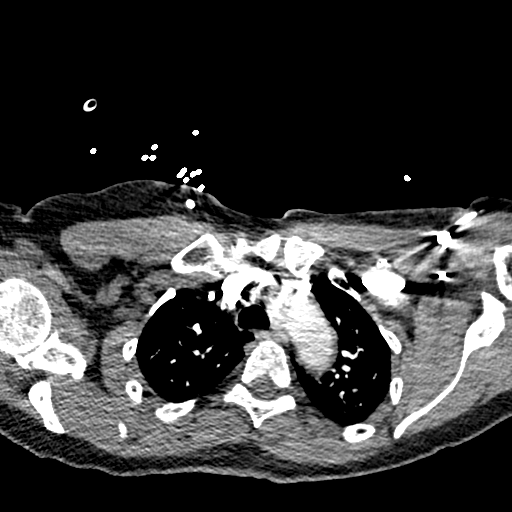
[im 224/284  lung]
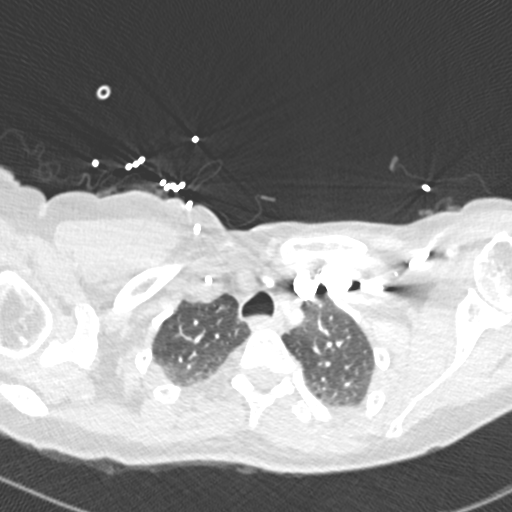
[im 239/284  mediastinal]
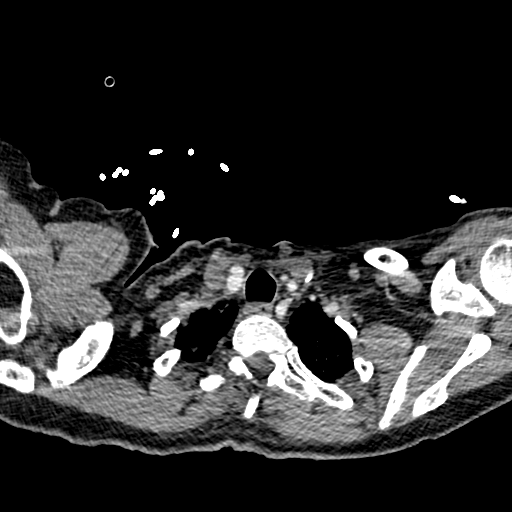
[im 254/284  lung]
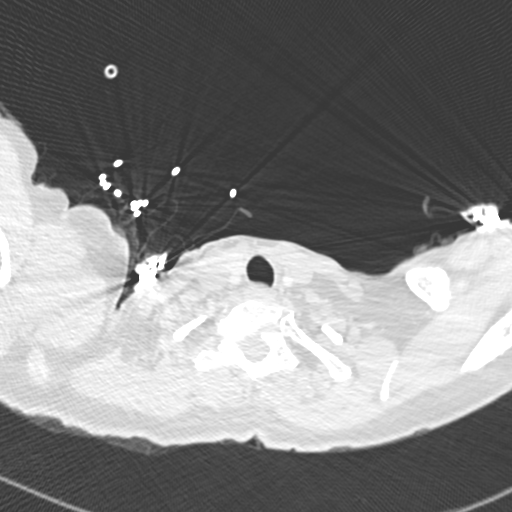
[im 269/284  mediastinal]
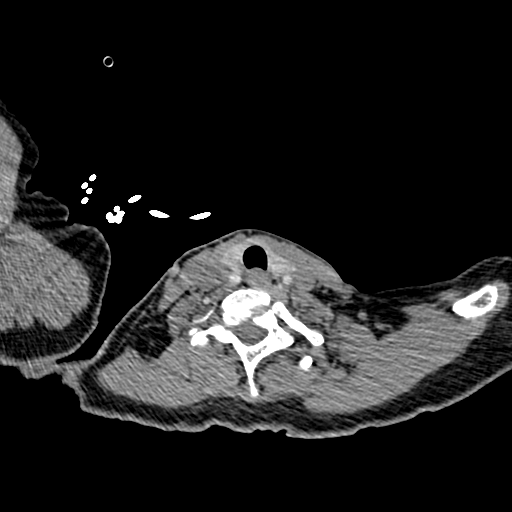

[Series 8: coronal mpr · coronal · 0.56mm/px · 1 of 105 slices shown]
[im 53/105  mediastinal]
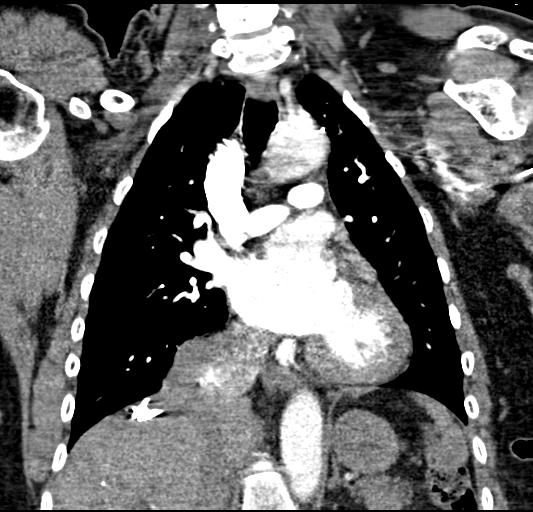

[19 of 36 positions shown; findings below may reference images not displayed]

FINDINGS: Chest wall: No breast masses, supraclavicular or axillary
lymphadenopathy. The thyroid gland is grossly normal.

Cardiovascular: The heart is within normal limits in size. No
pericardial effusion. The aorta is normal in caliber. No dissection.
The branch vessels are patent. No definite coronary artery
calcifications.

The pulmonary arterial tree is well opacified. No filling defects to
suggest pulmonary embolism.

Mediastinum/Nodes: No mediastinal or hilar mass or lymphadenopathy.
The esophagus is grossly normal.

Lungs/Pleura: Some breathing motion artifact and streaky areas of
subsegmental atelectasis but no infiltrates, edema or effusions. A
few small subpleural sub 4 mm pulmonary nodules are likely lymph
nodes.

Upper Abdomen: Surgical changes involving the liver. No significant
upper abdominal findings.

Musculoskeletal: No significant bony findings.

Review of the MIP images confirms the above findings.
IMPRESSION: 1. No CT findings for pulmonary embolism.
2. Normal thoracic aorta.
3. No mediastinal or hilar mass or lymphadenopathy.
4. No significant pulmonary findings.

## 2016-05-19 IMAGING — CR DG CHEST 2V
2 series · 2 of 2 positions shown · non-contrast
Comparison: None.

CLINICAL DATA: LEFT chest pain for 2 days. History of metastatic
colon cancer.

EXAM:
CHEST  2 VIEW

[chest pa]
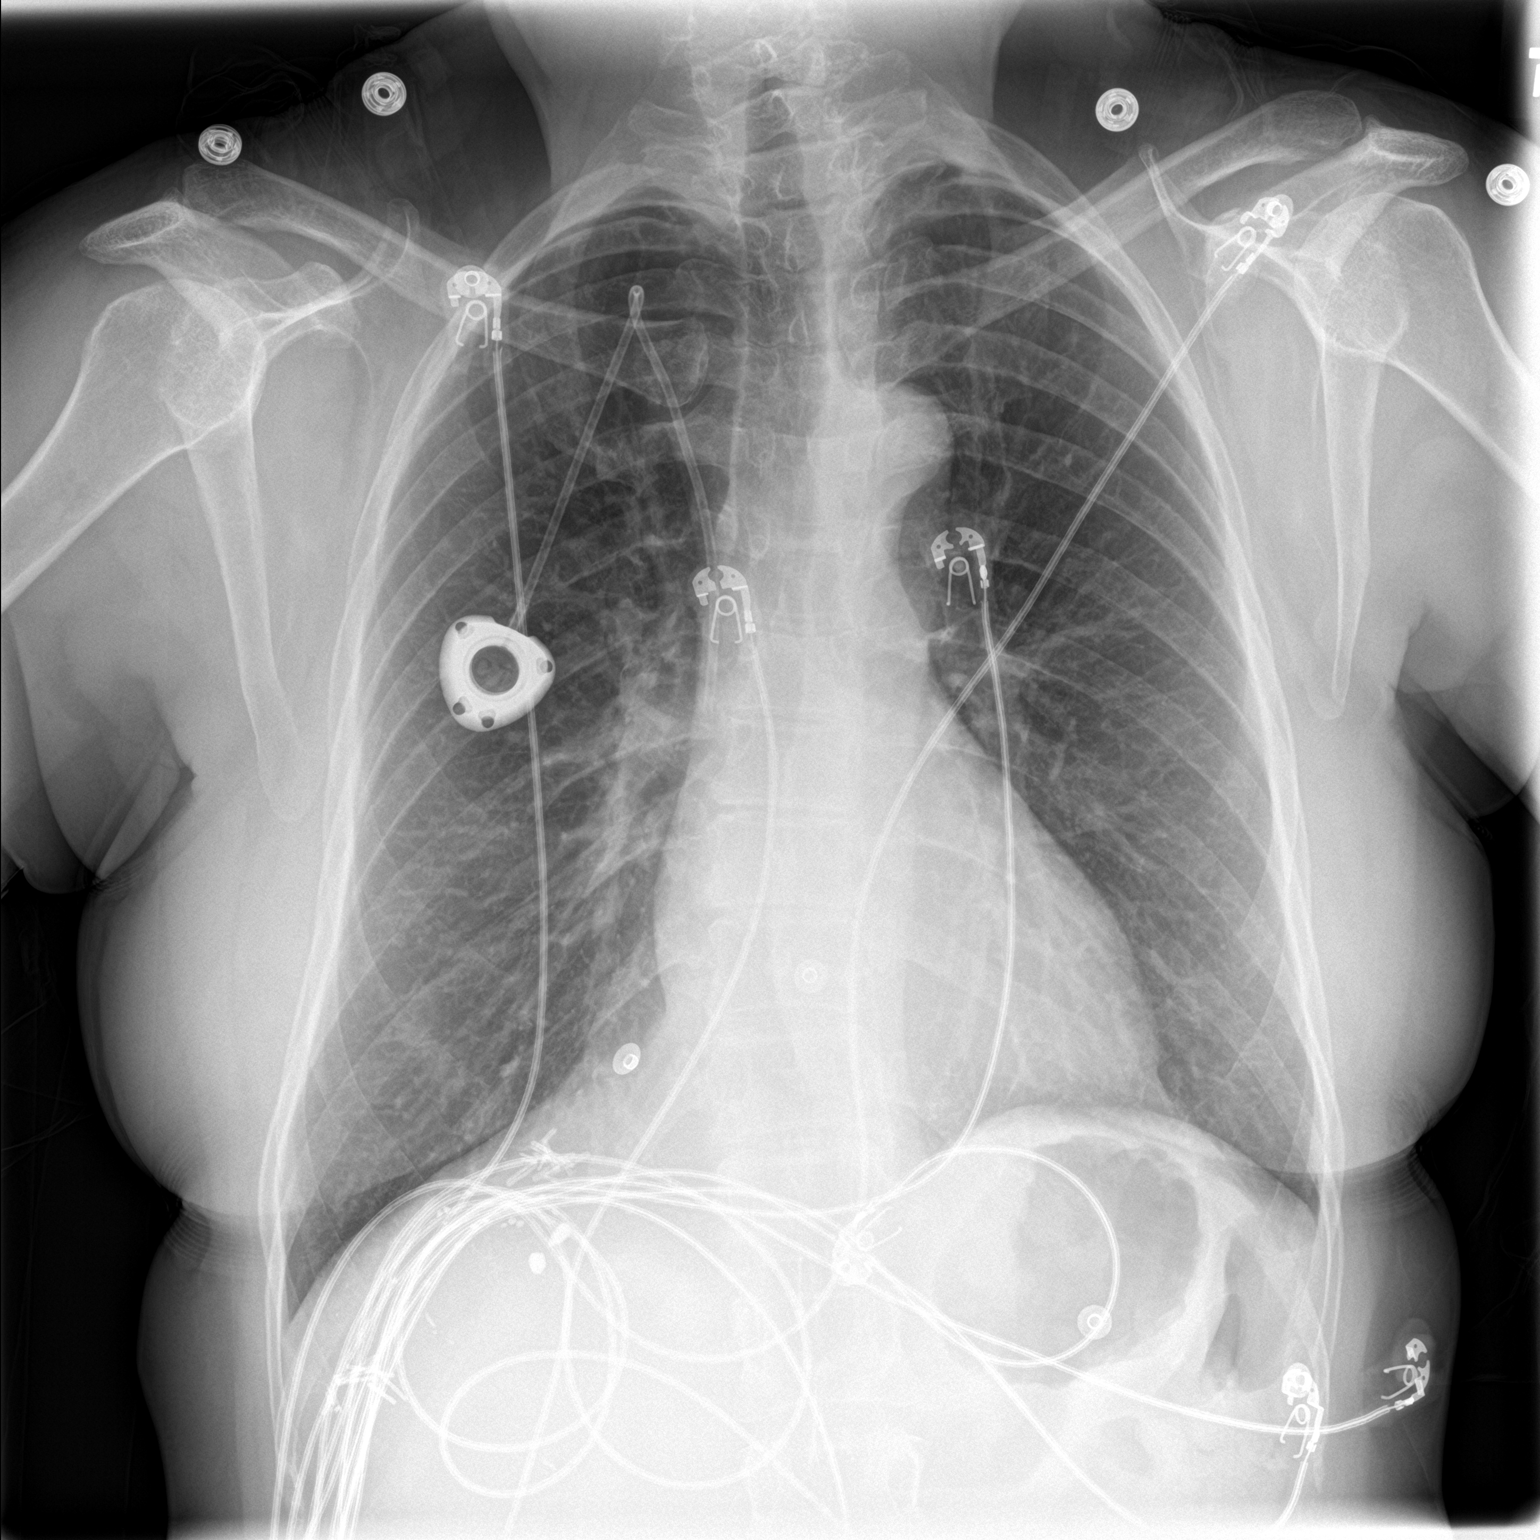

[chest lat]
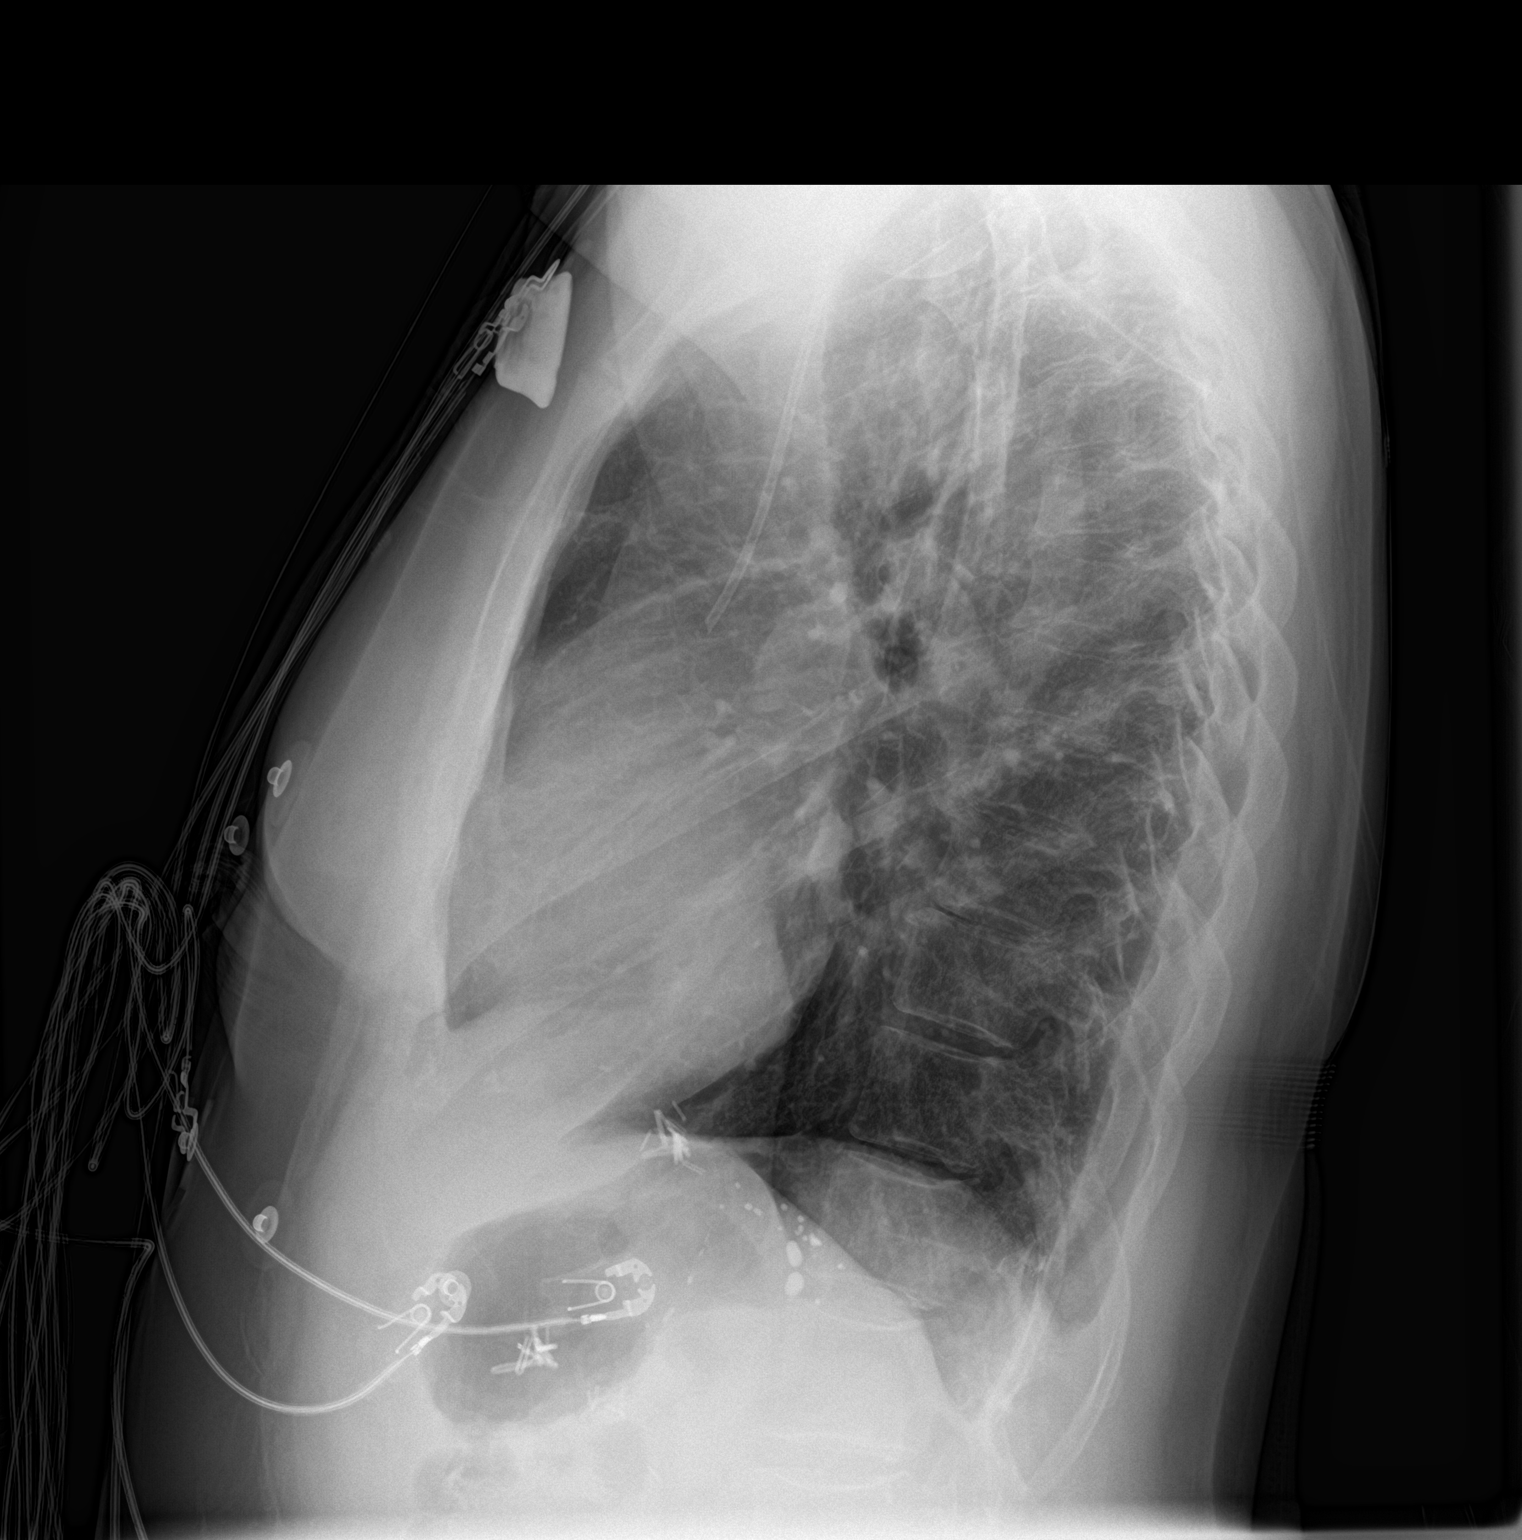

[2 of 2 positions shown; findings below may reference images not displayed]

FINDINGS: Cardiomediastinal silhouette is normal. No pleural effusions or
focal consolidations. Trachea projects midline and there is no
pneumothorax. Soft tissue planes and included osseous structures are
non-suspicious. Single lumen RIGHT chest Port-A-Cath with distal tip
projecting in mid superior vena cava. Calcifications in surgical
clips projecting in RIGHT upper quadrant. Mild lower thoracic
dextroscoliosis.
IMPRESSION: No acute cardiopulmonary process.

## 2016-05-19 MED ORDER — MORPHINE SULFATE (PF) 4 MG/ML IV SOLN
4.0000 mg | Freq: Once | INTRAVENOUS | Status: AC
  Administered 2016-05-19: 4 mg via INTRAVENOUS
  Filled 2016-05-19: qty 1

## 2016-05-19 MED ORDER — ASPIRIN 81 MG PO CHEW: 324.0000 mg | CHEWABLE_TABLET | Freq: Once | ORAL | Status: DC

## 2016-05-19 MED ORDER — IOPAMIDOL (ISOVUE-370) INJECTION 76%
INTRAVENOUS | Status: AC
  Administered 2016-05-19: 100 mL
  Filled 2016-05-19: qty 100

## 2016-05-19 NOTE — ED Triage Notes (Signed)
Pt reports chest pain off and on over past several days but earlier today it worsened, became continuous with diaphoresis and vomiting x 1.

## 2016-05-19 NOTE — Discharge Instructions (Signed)
Your heart work-up is reassuring today. You CT chest is normal. Please return without fail any worsening symptoms, including worsening pain, difficulty breathing, passing out, or any other symptoms concerning to you.

## 2016-05-19 NOTE — ED Provider Notes (Signed)
[place] DEPT Provider Note   CSN: [id number] Arrival date & time: 05/19/16  1300     History   Chief Complaint Chief Complaint  Patient presents with  . Chest Pain    HPI Kathryn Zavala is a 61 y.o. female.  The history is provided by the patient.  Chest Pain   This is a new problem. The current episode started 3 to 5 hours ago. The problem occurs constantly. The problem has been gradually improving. Associated with: nothing. The pain is present in the substernal region. The pain is moderate. The quality of the pain is described as dull and pressure-like. The pain does not radiate. Exacerbated by: nothing. Associated symptoms include diaphoresis, dizziness, nausea, shortness of breath and vomiting. Pertinent negatives include no abdominal pain, no back pain, no cough, no exertional chest pressure, no fever, no leg pain, no lower extremity edema, no near-syncope, no sputum production and no syncope. She has tried nothing for the symptoms. The treatment provided no relief. There are no known risk factors.  Pertinent negatives for past medical history include no CHF, no DVT, no hyperlipidemia, no hypertension and no PE.  Her family medical history is significant for CAD.   61 year old female with prior history of colon cancer in remission who presents with chest pain. States that she has been having intermittent symptoms episodes of chest pressure and shortness of breath lasting for a few minutes that occur at random over the past few days. Symptoms do not come on  on with exertion or activities such as walking or going up her steps. Today after waking up and getting into the shower she has noticed a constant chest pressure with shortness of breath, lightheadedness. This morning also with episode of diaphoresis and vomiting. Symptoms were constant, and she subsequently came to ED via EMS as for evaluation from her work. No leg swelling, calf tenderness, fevers, cough, abdominal pain,  diarrhea or urinary complaints. No history of HTN, HLD, DM. Family history of cardiac disease in mother.    Past Medical History:  Diagnosis Date  . Cancer ([place])   . Cataract     There are no active problems to display for this patient.   Past Surgical History:  Procedure Laterality Date  . ABDOMINAL HYSTERECTOMY    . COLON SURGERY    . JOINT REPLACEMENT    . LIVER SURGERY      OB History    No data available       Home Medications    Prior to Admission medications   Medication Sig Start Date End Date Taking? Authorizing Provider  HYDROcodone-acetaminophen (NORCO/VICODIN) 5-325 MG tablet Take 1 tablet by mouth every 4 (four) hours as needed. Patient taking differently: Take 1 tablet by mouth daily as needed (knee pain).  10/30/15  Yes Sherlene Shams, MD  Menthol-Methyl Salicylate (MUSCLE RUB EX) Apply 1 application topically 2 (two) times daily as needed (knee pain).   Yes Historical Provider, MD  Multiple Vitamin (MULTIVITAMIN WITH MINERALS) TABS tablet Take 1 tablet by mouth daily.   Yes Historical Provider, MD  Probiotic Product (PROBIOTIC PO) Take 1 tablet by mouth daily.   Yes Historical Provider, MD  Albuterol Sulfate (PROAIR RESPICLICK) 962 (90 BASE) MCG/ACT AEPB Inhale 2 puffs into the lungs 4 (four) times daily as needed. Patient not taking: Reported on 05/19/2016 01/02/15   Darreld Mclean, MD  naproxen (NAPROSYN) 500 MG tablet Take 1 tablet (500 mg total) by mouth 2 (two) times daily. Patient not  taking: Reported on 05/19/2016 10/30/15   Sherlene Shams, MD    Family History History reviewed. No pertinent family history.  Social History Social History  Substance Use Topics  . Smoking status: Former Smoker    Types: Cigarettes  . Smokeless tobacco: Former Systems developer    Quit date: 05/19/1985  . Alcohol use 0.0 oz/week     Comment: beer every other day     Allergies   Patient has no known allergies.   Review of Systems Review of Systems  Constitutional:  Positive for diaphoresis. Negative for fever.  HENT: Negative for congestion.   Respiratory: Positive for shortness of breath. Negative for cough and sputum production.   Cardiovascular: Positive for chest pain. Negative for syncope and near-syncope.  Gastrointestinal: Positive for nausea and vomiting. Negative for abdominal pain.  Genitourinary: Negative for difficulty urinating.  Musculoskeletal: Negative for back pain.  Allergic/Immunologic: Negative for immunocompromised state.  Neurological: Positive for dizziness. Negative for syncope.  Hematological: Does not bruise/bleed easily.  Psychiatric/Behavioral: Negative for confusion.  All other systems reviewed and are negative.    Physical Exam Updated Vital Signs BP 116/76   Pulse (!) 51   Temp 98.4 F (36.9 C) (Oral)   Resp 13   Ht 5\' 9"  (1.753 m)   Wt 179 lb (81.2 kg)   SpO2 98%   BMI 26.43 kg/m   Physical Exam Physical Exam  Nursing note and vitals reviewed. Constitutional: Well developed, well nourished, non-toxic, and in no acute distress Head: Normocephalic and atraumatic.  Mouth/Throat: Oropharynx is clear and moist.  Neck: Normal range of motion. Neck supple.  Cardiovascular: Normal rate and regular rhythm.   Pulmonary/Chest: Effort normal and breath sounds normal.  Abdominal: Soft. There is no tenderness. There is no rebound and no guarding.  Musculoskeletal: Normal range of motion. No edema, no calf tenderness  Neurological: Alert, no facial droop, fluent speech, moves all extremities symmetrically Skin: Skin is warm and dry.  Psychiatric: Cooperative]   ED Treatments / Results  Labs (all labs ordered are listed, but only abnormal results are displayed) Labs Reviewed  D-DIMER, QUANTITATIVE (NOT AT [facility]) - Abnormal; Notable for the following:       Result Value   D-Dimer, Quant 0.59 (*)    All other components within normal limits  BASIC METABOLIC PANEL  CBC  I-STAT TROPOININ, ED  I-STAT TROPOININ,  ED    EKG  EKG Interpretation  Date/Time:  Friday May 19 2016 13:10:41 EDT Ventricular Rate:  55 PR Interval:    QRS Duration: 91 QT Interval:  431 QTC Calculation: 413 R Axis:   58 Text Interpretation:  Sinus rhythm Ventricular premature complex Probable left atrial enlargement Left ventricular hypertrophy Baseline wander in lead(s) V6 Partial missing lead(s): V6 No ST elevation. no prior EKG  Reconfirmed by LIU MD, DANA [phone]) on 05/19/2016 8:17:51 PM       Radiology Dg Chest 2 View  Result Date: 05/19/2016 CLINICAL DATA:  LEFT chest pain for 2 days. History of metastatic colon cancer. EXAM: CHEST  2 VIEW COMPARISON:  None. FINDINGS: Cardiomediastinal silhouette is normal. No pleural effusions or focal consolidations. Trachea projects midline and there is no pneumothorax. Soft tissue planes and included osseous structures are non-suspicious. Single lumen RIGHT chest Port-A-Cath with distal tip projecting in mid superior vena cava. Calcifications in surgical clips projecting in RIGHT upper quadrant. Mild lower thoracic dextroscoliosis. IMPRESSION: No acute cardiopulmonary process. Electronically Signed   By: Elon Alas M.D.   On:  05/19/2016 14:00   Ct Angio Chest Pe W And/or Wo Contrast  Result Date: 05/19/2016 CLINICAL DATA:  Chest pain at work today.  Shortness of breath. EXAM: CT ANGIOGRAPHY CHEST WITH CONTRAST TECHNIQUE: Multidetector CT imaging of the chest was performed using the standard protocol during bolus administration of intravenous contrast. Multiplanar CT image reconstructions and MIPs were obtained to evaluate the vascular anatomy. CONTRAST:  100 cc Isovue 370 COMPARISON:  Chest x-ray 05/19/2016 FINDINGS: Chest wall: No breast masses, supraclavicular or axillary lymphadenopathy. The thyroid gland is grossly normal. Cardiovascular: The heart is within normal limits in size. No pericardial effusion. The aorta is normal in caliber. No dissection. The branch vessels are  patent. No definite coronary artery calcifications. The pulmonary arterial tree is well opacified. No filling defects to suggest pulmonary embolism. Mediastinum/Nodes: No mediastinal or hilar mass or lymphadenopathy. The esophagus is grossly normal. Lungs/Pleura: Some breathing motion artifact and streaky areas of subsegmental atelectasis but no infiltrates, edema or effusions. A few small subpleural sub 4 mm pulmonary nodules are likely lymph nodes. Upper Abdomen: Surgical changes involving the liver. No significant upper abdominal findings. Musculoskeletal: No significant bony findings. Review of the MIP images confirms the above findings. IMPRESSION: 1. No CT findings for pulmonary embolism. 2. Normal thoracic aorta. 3. No mediastinal or hilar mass or lymphadenopathy. 4. No significant pulmonary findings. Electronically Signed   By: Marijo Sanes M.D.   On: 05/19/2016 17:16    Procedures Procedures (including critical care time)  Medications Ordered in ED Medications  aspirin chewable tablet 324 mg (324 mg Oral Not Given 05/19/16 1511)  morphine 4 MG/ML injection 4 mg (4 mg Intravenous Given 05/19/16 1555)  iopamidol (ISOVUE-370) 76 % injection (100 mLs  Contrast Given 05/19/16 1651)     Initial Impression / Assessment and Plan / ED Course  I have reviewed the triage vital signs and the nursing notes.  Pertinent labs & imaging results that were available during my care of the patient were reviewed by me and considered in my medical decision making (see chart for details).     Presenting with non-exertional chest pressure and dyspnea since this morning. Vitals within normal limits on arrival. She is not toxic and in no acute distress. EKG is not acutely ischemic. Serial troponins are normal. Low risk for ACS with history that seems atypical for ACS. Heart score 2. Ddimer positive, but subsequent CT angio is normal. No other acute cardiopulmonary processes such as pe, dissection, edema, pneumonia,  or other processes. Symptoms improved after morphine. She is felt stable for discharge home.   The patient appears reasonably screened and/or stabilized for discharge and I doubt any other medical condition or other [facility] requiring further screening, evaluation, or treatment in the ED at this time prior to discharge.  Strict return and follow-up instructions reviewed. She expressed understanding of all discharge instructions and felt comfortable with the plan of care.    Final Clinical Impressions(s) / ED Diagnoses   Final diagnoses:  Nonspecific chest pain  Precordial chest pain    New Prescriptions Discharge Medication List as of 05/19/2016  6:12 PM       Forde Dandy, MD 05/19/16 2020

## 2016-05-19 NOTE — ED Notes (Signed)
Pt returned from CT °

## 2016-05-25 DIAGNOSIS — M25562 Pain in left knee: Secondary | ICD-10-CM | POA: Diagnosis not present

## 2016-05-25 DIAGNOSIS — T84033A Mechanical loosening of internal left knee prosthetic joint, initial encounter: Secondary | ICD-10-CM | POA: Diagnosis not present

## 2016-05-26 DIAGNOSIS — R791 Abnormal coagulation profile: Secondary | ICD-10-CM | POA: Diagnosis not present

## 2016-05-26 DIAGNOSIS — Z96652 Presence of left artificial knee joint: Secondary | ICD-10-CM | POA: Diagnosis not present

## 2016-05-26 DIAGNOSIS — T84033A Mechanical loosening of internal left knee prosthetic joint, initial encounter: Secondary | ICD-10-CM | POA: Diagnosis not present

## 2016-05-26 DIAGNOSIS — T8484XA Pain due to internal orthopedic prosthetic devices, implants and grafts, initial encounter: Secondary | ICD-10-CM | POA: Diagnosis not present

## 2016-05-26 DIAGNOSIS — R7989 Other specified abnormal findings of blood chemistry: Secondary | ICD-10-CM | POA: Diagnosis not present

## 2016-05-26 DIAGNOSIS — R8290 Unspecified abnormal findings in urine: Secondary | ICD-10-CM | POA: Diagnosis not present

## 2016-06-01 DIAGNOSIS — T84033D Mechanical loosening of internal left knee prosthetic joint, subsequent encounter: Secondary | ICD-10-CM | POA: Diagnosis not present

## 2016-06-01 DIAGNOSIS — M25562 Pain in left knee: Secondary | ICD-10-CM | POA: Diagnosis not present

## 2016-06-05 DIAGNOSIS — Z01818 Encounter for other preprocedural examination: Secondary | ICD-10-CM | POA: Diagnosis not present

## 2016-06-05 DIAGNOSIS — Z96652 Presence of left artificial knee joint: Secondary | ICD-10-CM | POA: Diagnosis not present

## 2016-06-05 DIAGNOSIS — R8290 Unspecified abnormal findings in urine: Secondary | ICD-10-CM | POA: Diagnosis not present

## 2016-06-20 DIAGNOSIS — Z8505 Personal history of malignant neoplasm of liver: Secondary | ICD-10-CM | POA: Diagnosis not present

## 2016-06-20 DIAGNOSIS — Z87891 Personal history of nicotine dependence: Secondary | ICD-10-CM | POA: Diagnosis not present

## 2016-06-20 DIAGNOSIS — Z9049 Acquired absence of other specified parts of digestive tract: Secondary | ICD-10-CM | POA: Diagnosis not present

## 2016-06-20 DIAGNOSIS — T84033A Mechanical loosening of internal left knee prosthetic joint, initial encounter: Secondary | ICD-10-CM | POA: Diagnosis not present

## 2016-06-20 DIAGNOSIS — Z85038 Personal history of other malignant neoplasm of large intestine: Secondary | ICD-10-CM | POA: Diagnosis not present

## 2016-06-20 DIAGNOSIS — D62 Acute posthemorrhagic anemia: Secondary | ICD-10-CM | POA: Diagnosis not present

## 2016-06-20 DIAGNOSIS — G8918 Other acute postprocedural pain: Secondary | ICD-10-CM | POA: Diagnosis not present

## 2016-06-20 DIAGNOSIS — Z96652 Presence of left artificial knee joint: Secondary | ICD-10-CM | POA: Diagnosis not present

## 2016-06-20 DIAGNOSIS — D2122 Benign neoplasm of connective and other soft tissue of left lower limb, including hip: Secondary | ICD-10-CM | POA: Diagnosis not present

## 2016-06-20 DIAGNOSIS — M25562 Pain in left knee: Secondary | ICD-10-CM | POA: Diagnosis not present

## 2016-06-24 DIAGNOSIS — Z8505 Personal history of malignant neoplasm of liver: Secondary | ICD-10-CM | POA: Diagnosis not present

## 2016-06-24 DIAGNOSIS — Z7982 Long term (current) use of aspirin: Secondary | ICD-10-CM | POA: Diagnosis not present

## 2016-06-24 DIAGNOSIS — M1990 Unspecified osteoarthritis, unspecified site: Secondary | ICD-10-CM | POA: Diagnosis not present

## 2016-06-24 DIAGNOSIS — Z85038 Personal history of other malignant neoplasm of large intestine: Secondary | ICD-10-CM | POA: Diagnosis not present

## 2016-06-24 DIAGNOSIS — Z96651 Presence of right artificial knee joint: Secondary | ICD-10-CM | POA: Diagnosis not present

## 2016-06-24 DIAGNOSIS — Z471 Aftercare following joint replacement surgery: Secondary | ICD-10-CM | POA: Diagnosis not present

## 2016-06-24 DIAGNOSIS — Z4801 Encounter for change or removal of surgical wound dressing: Secondary | ICD-10-CM | POA: Diagnosis not present

## 2016-06-26 DIAGNOSIS — Z7982 Long term (current) use of aspirin: Secondary | ICD-10-CM | POA: Diagnosis not present

## 2016-06-26 DIAGNOSIS — Z4801 Encounter for change or removal of surgical wound dressing: Secondary | ICD-10-CM | POA: Diagnosis not present

## 2016-06-26 DIAGNOSIS — Z85038 Personal history of other malignant neoplasm of large intestine: Secondary | ICD-10-CM | POA: Diagnosis not present

## 2016-06-26 DIAGNOSIS — Z471 Aftercare following joint replacement surgery: Secondary | ICD-10-CM | POA: Diagnosis not present

## 2016-06-26 DIAGNOSIS — M1990 Unspecified osteoarthritis, unspecified site: Secondary | ICD-10-CM | POA: Diagnosis not present

## 2016-06-26 DIAGNOSIS — Z96651 Presence of right artificial knee joint: Secondary | ICD-10-CM | POA: Diagnosis not present

## 2016-06-27 DIAGNOSIS — Z471 Aftercare following joint replacement surgery: Secondary | ICD-10-CM | POA: Diagnosis not present

## 2016-06-27 DIAGNOSIS — Z85038 Personal history of other malignant neoplasm of large intestine: Secondary | ICD-10-CM | POA: Diagnosis not present

## 2016-06-27 DIAGNOSIS — Z7982 Long term (current) use of aspirin: Secondary | ICD-10-CM | POA: Diagnosis not present

## 2016-06-27 DIAGNOSIS — Z4801 Encounter for change or removal of surgical wound dressing: Secondary | ICD-10-CM | POA: Diagnosis not present

## 2016-06-27 DIAGNOSIS — Z96651 Presence of right artificial knee joint: Secondary | ICD-10-CM | POA: Diagnosis not present

## 2016-06-27 DIAGNOSIS — M1990 Unspecified osteoarthritis, unspecified site: Secondary | ICD-10-CM | POA: Diagnosis not present

## 2016-06-28 DIAGNOSIS — Z471 Aftercare following joint replacement surgery: Secondary | ICD-10-CM | POA: Diagnosis not present

## 2016-06-28 DIAGNOSIS — M1990 Unspecified osteoarthritis, unspecified site: Secondary | ICD-10-CM | POA: Diagnosis not present

## 2016-06-28 DIAGNOSIS — Z96651 Presence of right artificial knee joint: Secondary | ICD-10-CM | POA: Diagnosis not present

## 2016-06-28 DIAGNOSIS — Z4801 Encounter for change or removal of surgical wound dressing: Secondary | ICD-10-CM | POA: Diagnosis not present

## 2016-06-28 DIAGNOSIS — Z7982 Long term (current) use of aspirin: Secondary | ICD-10-CM | POA: Diagnosis not present

## 2016-06-28 DIAGNOSIS — Z85038 Personal history of other malignant neoplasm of large intestine: Secondary | ICD-10-CM | POA: Diagnosis not present

## 2016-07-04 DIAGNOSIS — Z96651 Presence of right artificial knee joint: Secondary | ICD-10-CM | POA: Diagnosis not present

## 2016-07-04 DIAGNOSIS — Z7982 Long term (current) use of aspirin: Secondary | ICD-10-CM | POA: Diagnosis not present

## 2016-07-04 DIAGNOSIS — M1990 Unspecified osteoarthritis, unspecified site: Secondary | ICD-10-CM | POA: Diagnosis not present

## 2016-07-04 DIAGNOSIS — Z4801 Encounter for change or removal of surgical wound dressing: Secondary | ICD-10-CM | POA: Diagnosis not present

## 2016-07-04 DIAGNOSIS — Z85038 Personal history of other malignant neoplasm of large intestine: Secondary | ICD-10-CM | POA: Diagnosis not present

## 2016-07-04 DIAGNOSIS — Z471 Aftercare following joint replacement surgery: Secondary | ICD-10-CM | POA: Diagnosis not present

## 2016-07-06 DIAGNOSIS — Z96651 Presence of right artificial knee joint: Secondary | ICD-10-CM | POA: Diagnosis not present

## 2016-07-06 DIAGNOSIS — M1990 Unspecified osteoarthritis, unspecified site: Secondary | ICD-10-CM | POA: Diagnosis not present

## 2016-07-06 DIAGNOSIS — Z471 Aftercare following joint replacement surgery: Secondary | ICD-10-CM | POA: Diagnosis not present

## 2016-07-06 DIAGNOSIS — Z4801 Encounter for change or removal of surgical wound dressing: Secondary | ICD-10-CM | POA: Diagnosis not present

## 2016-07-06 DIAGNOSIS — Z85038 Personal history of other malignant neoplasm of large intestine: Secondary | ICD-10-CM | POA: Diagnosis not present

## 2016-07-06 DIAGNOSIS — Z7982 Long term (current) use of aspirin: Secondary | ICD-10-CM | POA: Diagnosis not present

## 2016-07-11 DIAGNOSIS — Z85038 Personal history of other malignant neoplasm of large intestine: Secondary | ICD-10-CM | POA: Diagnosis not present

## 2016-07-11 DIAGNOSIS — Z4801 Encounter for change or removal of surgical wound dressing: Secondary | ICD-10-CM | POA: Diagnosis not present

## 2016-07-11 DIAGNOSIS — M1990 Unspecified osteoarthritis, unspecified site: Secondary | ICD-10-CM | POA: Diagnosis not present

## 2016-07-11 DIAGNOSIS — Z471 Aftercare following joint replacement surgery: Secondary | ICD-10-CM | POA: Diagnosis not present

## 2016-07-11 DIAGNOSIS — Z7982 Long term (current) use of aspirin: Secondary | ICD-10-CM | POA: Diagnosis not present

## 2016-07-11 DIAGNOSIS — Z96651 Presence of right artificial knee joint: Secondary | ICD-10-CM | POA: Diagnosis not present

## 2016-07-14 DIAGNOSIS — Z7982 Long term (current) use of aspirin: Secondary | ICD-10-CM | POA: Diagnosis not present

## 2016-07-14 DIAGNOSIS — M1990 Unspecified osteoarthritis, unspecified site: Secondary | ICD-10-CM | POA: Diagnosis not present

## 2016-07-14 DIAGNOSIS — Z4801 Encounter for change or removal of surgical wound dressing: Secondary | ICD-10-CM | POA: Diagnosis not present

## 2016-07-14 DIAGNOSIS — Z471 Aftercare following joint replacement surgery: Secondary | ICD-10-CM | POA: Diagnosis not present

## 2016-07-14 DIAGNOSIS — Z96651 Presence of right artificial knee joint: Secondary | ICD-10-CM | POA: Diagnosis not present

## 2016-07-14 DIAGNOSIS — Z85038 Personal history of other malignant neoplasm of large intestine: Secondary | ICD-10-CM | POA: Diagnosis not present

## 2016-08-17 DIAGNOSIS — Z96652 Presence of left artificial knee joint: Secondary | ICD-10-CM | POA: Diagnosis not present

## 2016-08-17 DIAGNOSIS — Z471 Aftercare following joint replacement surgery: Secondary | ICD-10-CM | POA: Diagnosis not present

## 2016-12-18 ENCOUNTER — Ambulatory Visit (HOSPITAL_COMMUNITY)
Admission: EM | Admit: 2016-12-18 | Discharge: 2016-12-18 | Disposition: A | Discharge status: Home / Self Care | Payer: Medicare Other | Attending: Emergency Medicine | Admitting: Emergency Medicine

## 2016-12-18 ENCOUNTER — Encounter (HOSPITAL_COMMUNITY): Payer: Self-pay | Admitting: Emergency Medicine

## 2016-12-18 ENCOUNTER — Other Ambulatory Visit: Payer: Self-pay

## 2016-12-18 DIAGNOSIS — L03011 Cellulitis of right finger: Secondary | ICD-10-CM

## 2016-12-18 MED ORDER — CEPHALEXIN 500 MG PO CAPS: 500.0000 mg | ORAL_CAPSULE | Freq: Four times a day (QID) | ORAL | 0 refills | Status: AC

## 2016-12-18 MED ORDER — NAPROXEN 500 MG PO TABS: 500.0000 mg | ORAL_TABLET | Freq: Two times a day (BID) | ORAL | 0 refills | Status: AC

## 2016-12-18 NOTE — ED Provider Notes (Signed)
[place]    CSN: [id number] Arrival date & time: 12/18/16  1546     History   Chief Complaint Chief Complaint  Patient presents with  . Nail Problem    HPI Kathryn Zavala is a 61 y.o. female.   61 year old female comes in with 4 day history of right middle finger pain with swelling and redness around nailbed. Patient has been soaking area in salt/water without improvement. Has not taken anything for the pain. Does bite nail/pick at nail. She has noticed the redness spreading. Denies fever, chills, night sweats.       Past Medical History:  Diagnosis Date  . Cancer ([place])   . Cataract     There are no active problems to display for this patient.   Past Surgical History:  Procedure Laterality Date  . ABDOMINAL HYSTERECTOMY    . COLON SURGERY    . JOINT REPLACEMENT    . LIVER SURGERY      OB History    No data available       Home Medications    Prior to Admission medications   Medication Sig Start Date End Date Taking? Authorizing Provider  cephALEXin (KEFLEX) 500 MG capsule Take 1 capsule (500 mg total) 4 (four) times daily for 5 days by mouth. 12/18/16 12/23/16  Tasia Catchings, Zenita Kister V, PA-C  naproxen (NAPROSYN) 500 MG tablet Take 1 tablet (500 mg total) 2 (two) times daily for 10 days by mouth. 12/18/16 12/28/16  Ok Edwards, PA-C    Family History No family history on file.  Social History Social History   Tobacco Use  . Smoking status: Former Smoker    Types: Cigarettes  . Smokeless tobacco: Former Systems developer    Quit date: 05/19/1985  Substance Use Topics  . Alcohol use: Yes    Alcohol/week: 0.0 oz    Comment: beer every other day  . Drug use: No     Allergies   Patient has no known allergies.   Review of Systems Review of Systems  Reason unable to perform ROS: See HPI as above.     Physical Exam Triage Vital Signs ED Triage Vitals  Enc Vitals Group     BP 12/18/16 1606 108/76     Pulse Rate 12/18/16 1606 74     Resp 12/18/16  1606 18     Temp 12/18/16 1606 98.1 F (36.7 C)     Temp src --      SpO2 12/18/16 1606 100 %     Weight --      Height --      Head Circumference --      Peak Flow --      Pain Score 12/18/16 1607 10     Pain Loc --      Pain Edu? --      Excl. in [place]? --    No data found.  Updated Vital Signs BP 108/76   Pulse 74   Temp 98.1 F (36.7 C)   Resp 18   SpO2 100%   Physical Exam  Constitutional: She is oriented to person, place, and time. She appears well-developed and well-nourished. No distress.  HENT:  Head: Normocephalic and atraumatic.  Eyes: Conjunctivae are normal. Pupils are equal, round, and reactive to light.  Musculoskeletal:  Paronychia of the right middle finger with swelling and erythema around the nailbed down to the DIP joint. Tenderness on palpation with mild increased warmth. Full ROM of finger. Cap refill <2s.  Neurological: She is alert and oriented to person, place, and time.     UC Treatments / Results  Labs (all labs ordered are listed, but only abnormal results are displayed) Labs Reviewed - No data to display  EKG  EKG Interpretation None       Radiology No results found.  Procedures Incision and Drainage Date/Time: 12/18/2016 5:23 PM Performed by: Ok Edwards, PA-C Authorized by: Melynda Ripple, MD   Consent:    Consent obtained:  Verbal   Consent given by:  Patient   Risks discussed:  Bleeding, incomplete drainage and infection   Alternatives discussed:  Alternative treatment Location:    Type:  Abscess   Size:  0.5cm   Location:  Upper extremity   Upper extremity location:  Finger   Finger location:  R long finger Pre-procedure details:    Skin preparation:  Betadine Anesthesia (see MAR for exact dosages):    Anesthesia method:  Topical application   Topical anesthesia: freezing spray. Procedure type:    Complexity:  Simple Procedure details:    Incision types:  Stab incision   Incision depth:  Dermal   Scalpel  blade:  11   Wound management:  Extensive cleaning   Drainage:  Purulent   Drainage amount:  Moderate   Wound treatment:  Wound left open   Packing materials:  None Post-procedure details:    Patient tolerance of procedure:  Tolerated well, no immediate complications   (including critical care time)  Medications Ordered in UC Medications - No data to display   Initial Impression / Assessment and Plan / UC Course  I have reviewed the triage vital signs and the nursing notes.  Pertinent labs & imaging results that were available during my care of the patient were reviewed by me and considered in my medical decision making (see chart for details).    Patient tolerated I&D well. Given surrounding cellulitis, will start keflex. Naproxen for pain/inflammation. Wound care instructions given. Return precautions given.   Final Clinical Impressions(s) / UC Diagnoses   Final diagnoses:  Paronychia of right middle finger    ED Discharge Orders        Ordered    cephALEXin (KEFLEX) 500 MG capsule  4 times daily     12/18/16 1717    naproxen (NAPROSYN) 500 MG tablet  2 times daily     12/18/16 1717        Ok Edwards, PA-C 12/18/16 1725

## 2016-12-18 NOTE — Discharge Instructions (Signed)
Start keflex as directed. Naproxen for pain and inflammation. Daily dressing change. If experiencing worsening symptoms, spreading redness, increased warmth, follow up with PCP for further evaluation needed.

## 2016-12-18 NOTE — ED Triage Notes (Signed)
Pt c/o R middle finger tip swelling around nailbed.

## 2016-12-21 ENCOUNTER — Encounter (HOSPITAL_COMMUNITY): Payer: Self-pay | Admitting: Family Medicine

## 2016-12-21 ENCOUNTER — Ambulatory Visit (HOSPITAL_COMMUNITY)
Admission: EM | Admit: 2016-12-21 | Discharge: 2016-12-21 | Disposition: A | Discharge status: Home / Self Care | Payer: Medicare Other | Attending: Emergency Medicine | Admitting: Emergency Medicine

## 2016-12-21 DIAGNOSIS — L03011 Cellulitis of right finger: Secondary | ICD-10-CM

## 2016-12-21 MED ORDER — MUPIROCIN 2 % EX OINT
TOPICAL_OINTMENT | CUTANEOUS | 0 refills | Status: DC
Start: 2016-12-21 — End: 2017-03-26

## 2016-12-21 MED ORDER — SULFAMETHOXAZOLE-TRIMETHOPRIM 800-160 MG PO TABS: 1.0000 | ORAL_TABLET | Freq: Two times a day (BID) | ORAL | 0 refills | Status: DC

## 2016-12-21 NOTE — ED Triage Notes (Signed)
Pt here for continued right middle finger swelling. Reports she was here 3 days ago and currently taking abx.
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
## 2016-12-21 NOTE — Discharge Instructions (Signed)
Daily dressing changes, take abx as directed. Return as needed.
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
## 2017-03-26 ENCOUNTER — Ambulatory Visit (HOSPITAL_COMMUNITY)
Admission: EM | Admit: 2017-03-26 | Discharge: 2017-03-26 | Disposition: A | Discharge status: Home / Self Care | Payer: Medicare Other | Attending: Family Medicine | Admitting: Family Medicine

## 2017-03-26 ENCOUNTER — Encounter (HOSPITAL_COMMUNITY): Payer: Self-pay | Admitting: Family Medicine

## 2017-03-26 DIAGNOSIS — Z87891 Personal history of nicotine dependence: Secondary | ICD-10-CM | POA: Diagnosis not present

## 2017-03-26 DIAGNOSIS — J40 Bronchitis, not specified as acute or chronic: Secondary | ICD-10-CM | POA: Insufficient documentation

## 2017-03-26 DIAGNOSIS — N390 Urinary tract infection, site not specified: Secondary | ICD-10-CM | POA: Diagnosis not present

## 2017-03-26 LAB — POCT URINALYSIS DIP (DEVICE)
Bilirubin Urine: NEGATIVE
GLUCOSE, UA: NEGATIVE mg/dL
Nitrite: POSITIVE — AB
PROTEIN: 100 mg/dL — AB
SPECIFIC GRAVITY, URINE: 1.025 (ref 1.005–1.030)
UROBILINOGEN UA: 2 mg/dL — AB (ref 0.0–1.0)
pH: 6 (ref 5.0–8.0)

## 2017-03-26 MED ORDER — LEVOFLOXACIN 500 MG PO TABS: 500.0000 mg | ORAL_TABLET | Freq: Every day | ORAL | 0 refills | Status: DC

## 2017-03-26 MED ORDER — HYDROCODONE-HOMATROPINE 5-1.5 MG/5ML PO SYRP: 5.0000 mL | ORAL_SOLUTION | Freq: Four times a day (QID) | ORAL | 0 refills | Status: DC | PRN

## 2017-03-26 NOTE — ED Provider Notes (Signed)
[place]   [id number] 03/26/17 Arrival Time: 1059   SUBJECTIVE:  Kathryn Zavala is a 62 y.o. female who presents to the urgent care with complaint of UTI symptoms and flu symptoms.  She developed muscle aches, headache and vomiting last Thursday 14 Feb.  She has then developed worsening productive cough.  No diarrhea.  The urinary frequency and dysuria have been present these 4 days as well.  No flank pain.  Works at [employer].  Past Medical History:  Diagnosis Date  . Cancer ([place])   . Cataract    No family history on file. Social History   Socioeconomic History  . Marital status: Single    Spouse name: Not on file  . Number of children: Not on file  . Years of education: Not on file  . Highest education level: Not on file  Social Needs  . Financial resource strain: Not on file  . Food insecurity - worry: Not on file  . Food insecurity - inability: Not on file  . Transportation needs - medical: Not on file  . Transportation needs - non-medical: Not on file  Occupational History  . Not on file  Tobacco Use  . Smoking status: Former Smoker    Types: Cigarettes  . Smokeless tobacco: Former Systems developer    Quit date: 05/19/1985  Substance and Sexual Activity  . Alcohol use: Yes    Alcohol/week: 0.0 oz    Comment: beer every other day  . Drug use: No  . Sexual activity: Not on file  Other Topics Concern  . Not on file  Social History Narrative  . Not on file   No outpatient medications have been marked as taking for the 03/26/17 encounter [facility] Encounter).   No Known Allergies    ROS: As per HPI, remainder of ROS negative.   OBJECTIVE:   Vitals:   03/26/17 1236  BP: (!) 126/96  Pulse: 76  Resp: 16  Temp: 99.3 F (37.4 C)  TempSrc: Oral  SpO2: 99%     General appearance: alert; no distress Eyes: PERRL; EOMI; conjunctiva normal HENT: normocephalic; atraumatic; TMs normal, canal normal, external ears normal without trauma;  nasal mucosa normal; oral mucosa normal Neck: supple Lungs: clear to auscultation bilaterally Heart: regular rate and rhythm Abdomen: soft, non-tender; bowel sounds normal; no masses or organomegaly; no guarding or rebound tenderness Back: no CVA tenderness Extremities: no cyanosis or edema; symmetrical with no gross deformities Skin: warm and dry Neurologic: normal gait; grossly normal Psychological: alert and cooperative; normal mood and affect      Labs:  Results for orders placed or performed during the hospital encounter of 03/26/17  POCT urinalysis dip (device)  Result Value Ref Range   Glucose, UA NEGATIVE NEGATIVE mg/dL   Bilirubin Urine NEGATIVE NEGATIVE   Ketones, ur TRACE (A) NEGATIVE mg/dL   Specific Gravity, Urine 1.025 1.005 - 1.030   Hgb urine dipstick SMALL (A) NEGATIVE   pH 6.0 5.0 - 8.0   Protein, ur 100 (A) NEGATIVE mg/dL   Urobilinogen, UA 2.0 (H) 0.0 - 1.0 mg/dL   Nitrite POSITIVE (A) NEGATIVE   Leukocytes, UA MODERATE (A) NEGATIVE    Labs Reviewed  POCT URINALYSIS DIP (DEVICE) - Abnormal; Notable for the following components:      Result Value   Ketones, ur TRACE (*)    Hgb urine dipstick SMALL (*)    Protein, ur 100 (*)    Urobilinogen, UA 2.0 (*)    Nitrite POSITIVE (*)  Leukocytes, UA MODERATE (*)    All other components within normal limits  URINE CULTURE    No results found.     ASSESSMENT & PLAN:  1. Urinary tract infection without hematuria, site unspecified   2. Bronchitis     Meds ordered this encounter  Medications  . levofloxacin (LEVAQUIN) 500 MG tablet    Sig: Take 1 tablet (500 mg total) by mouth daily.    Dispense:  7 tablet    Refill:  0  . HYDROcodone-homatropine (HYDROMET) 5-1.5 MG/5ML syrup    Sig: Take 5 mLs by mouth every 6 (six) hours as needed for cough.    Dispense:  60 mL    Refill:  0    Reviewed expectations re: course of current medical issues. Questions answered. Outlined signs and symptoms  indicating need for more acute intervention. Patient verbalized understanding. After Visit Summary given.    Procedures:      Robyn Haber, MD 03/26/17 1248

## 2017-03-26 NOTE — ED Notes (Signed)
Triaged by provider  

## 2017-03-28 LAB — URINE CULTURE: Culture: >=100000 — AB

## 2017-09-24 ENCOUNTER — Ambulatory Visit
Admission: RE | Admit: 2017-09-24 | Discharge: 2017-09-24 | Disposition: A | Discharge status: Home / Self Care | Payer: Medicare Other | Source: Ambulatory Visit | Attending: Family Medicine | Admitting: Family Medicine

## 2017-09-24 ENCOUNTER — Ambulatory Visit (INDEPENDENT_AMBULATORY_CARE_PROVIDER_SITE_OTHER): Payer: Medicare Other | Admitting: Family Medicine

## 2017-09-24 ENCOUNTER — Encounter: Payer: Self-pay | Admitting: Family Medicine

## 2017-09-24 VITALS — BP 110/68 | HR 78 | Temp 98.2°F | Resp 16 | Ht 69.0 in | Wt 191.0 lb

## 2017-09-24 DIAGNOSIS — Z1231 Encounter for screening mammogram for malignant neoplasm of breast: Secondary | ICD-10-CM

## 2017-09-24 DIAGNOSIS — Z1159 Encounter for screening for other viral diseases: Secondary | ICD-10-CM

## 2017-09-24 DIAGNOSIS — Z7689 Persons encountering health services in other specified circumstances: Secondary | ICD-10-CM | POA: Diagnosis not present

## 2017-09-24 DIAGNOSIS — Z13220 Encounter for screening for lipoid disorders: Secondary | ICD-10-CM

## 2017-09-24 DIAGNOSIS — M25562 Pain in left knee: Secondary | ICD-10-CM

## 2017-09-24 DIAGNOSIS — Z780 Asymptomatic menopausal state: Secondary | ICD-10-CM | POA: Diagnosis not present

## 2017-09-24 DIAGNOSIS — Z0000 Encounter for general adult medical examination without abnormal findings: Secondary | ICD-10-CM | POA: Diagnosis not present

## 2017-09-24 DIAGNOSIS — G8929 Other chronic pain: Secondary | ICD-10-CM | POA: Diagnosis not present

## 2017-09-24 DIAGNOSIS — Z1239 Encounter for other screening for malignant neoplasm of breast: Secondary | ICD-10-CM

## 2017-09-24 DIAGNOSIS — Z114 Encounter for screening for human immunodeficiency virus [HIV]: Secondary | ICD-10-CM | POA: Diagnosis not present

## 2017-09-24 DIAGNOSIS — Z85038 Personal history of other malignant neoplasm of large intestine: Secondary | ICD-10-CM

## 2017-09-24 DIAGNOSIS — I639 Cerebral infarction, unspecified: Secondary | ICD-10-CM | POA: Diagnosis not present

## 2017-09-24 IMAGING — CR DG KNEE COMPLETE 4+V*L*
4 series · 4 of 4 positions shown · non-contrast
Comparison: None.

CLINICAL DATA: LEFT knee pain [LF], since second knee
replacement in [DATE] / intermittent medial pain inferior to
patella and radiating pain in lateal tib/fib / no recent trauma

EXAM:
LEFT KNEE - COMPLETE 4+ VIEW

[t knee ap left]
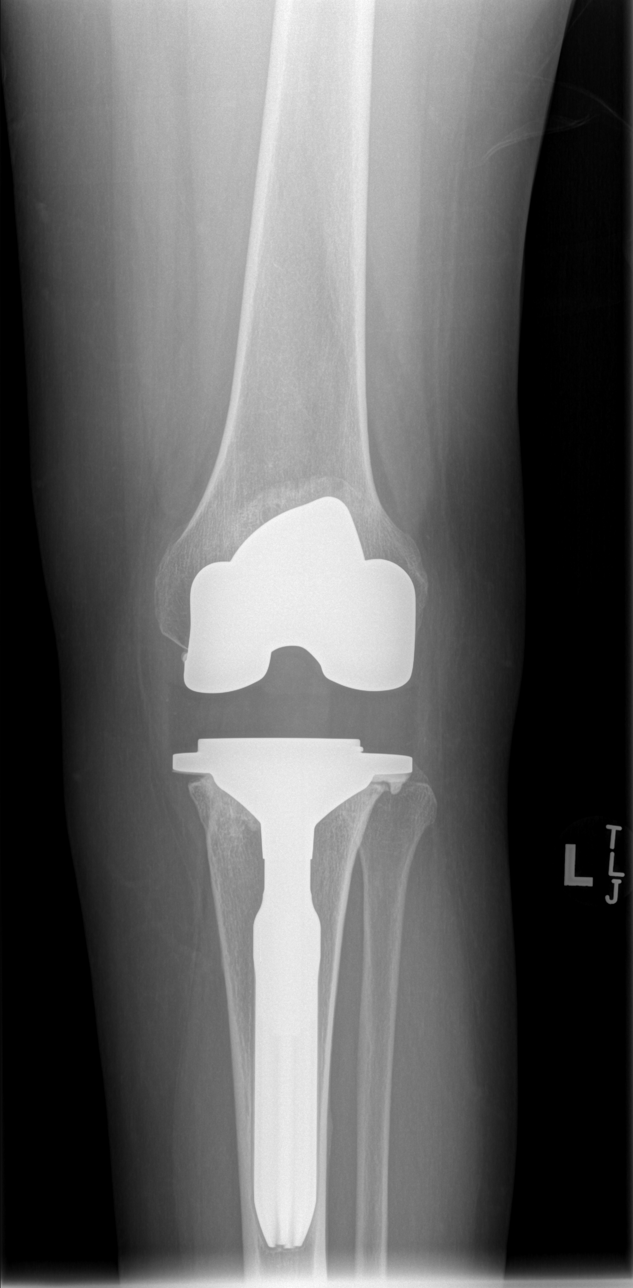

[t knee oblique left (1 of 2)]
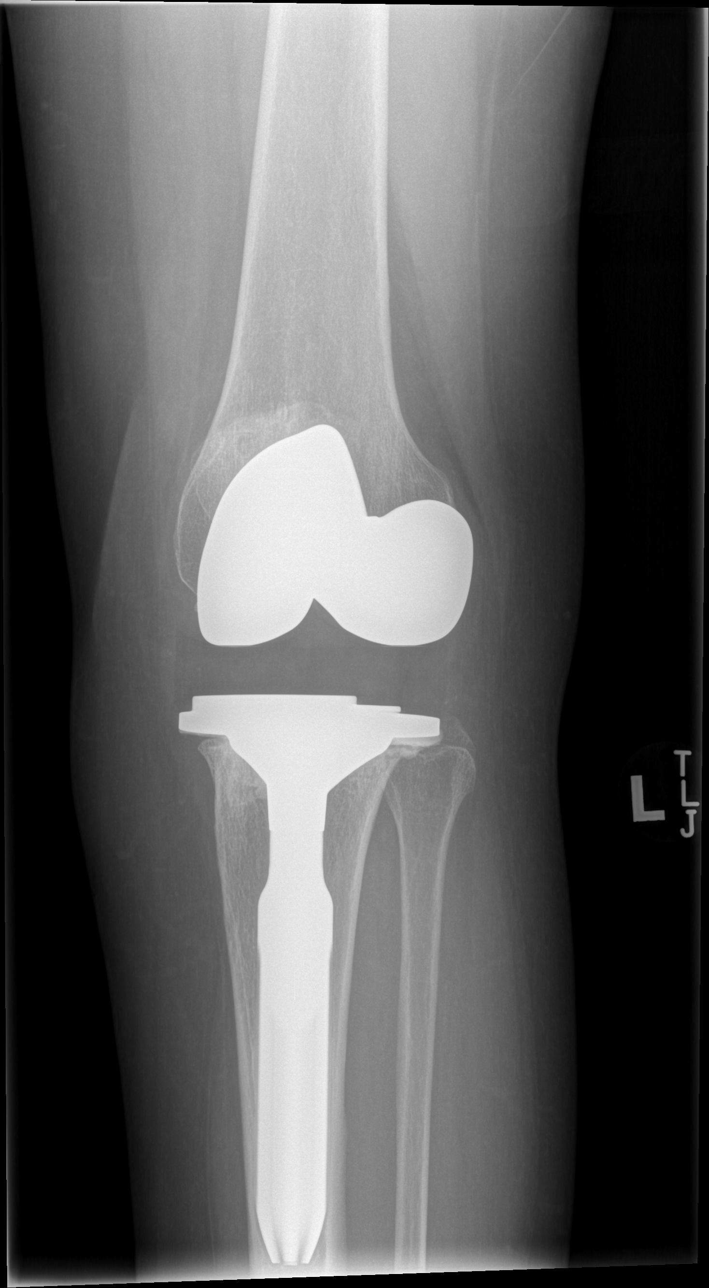

[t knee oblique left (2 of 2)]
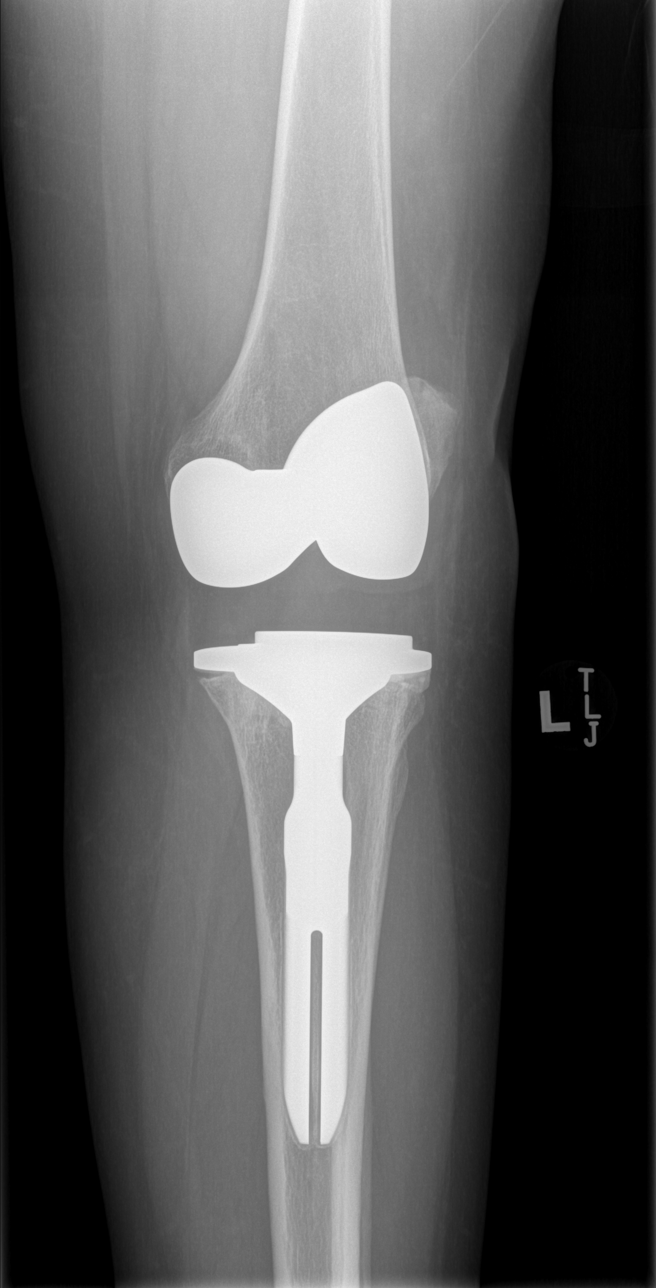

[t knee lat left]
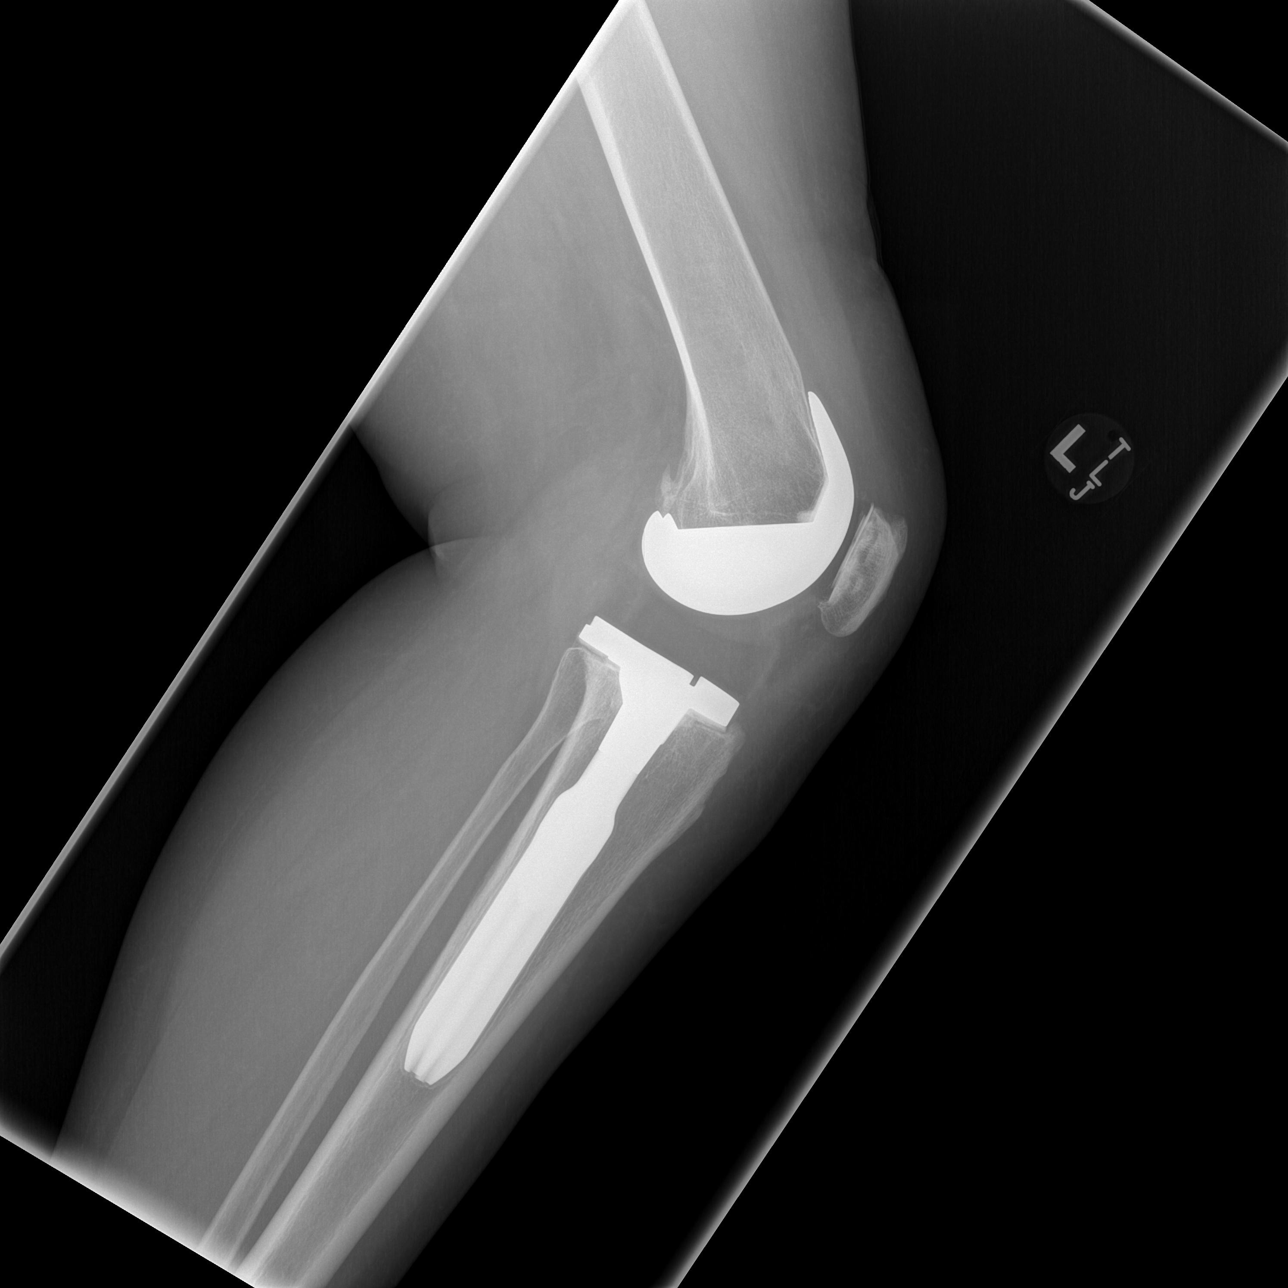

[4 of 4 positions shown; findings below may reference images not displayed]

FINDINGS: Total LEFT knee arthroplasty hardware appears intact and
appropriately positioned. No acute or suspicious osseous finding. No
appreciable joint effusion and adjacent soft tissues are
unremarkable.
IMPRESSION: 1. No acute findings.
2. LEFT knee arthroplasty hardware appears intact and appropriately
positioned.

## 2017-09-24 NOTE — Progress Notes (Signed)
Subjective:    Patient ID: Kathryn Zavala, female    DOB: 1955/09/05, 62 y.o.   MRN: [id number]  HPI  Patient is a very pleasant 62 year old African-American female here today to establish care.  She has a very complicated past medical history.  Patient was diagnosed with colon cancer in 2010.  She underwent a hemicolectomy per her report.  There was also metastasis to the liver.  Again per her report the individual metastasis/masses were removed surgically and then she was treated with chemotherapy.  She was reportedly told that she was cancer free after chemotherapy starting in 2013.  She was followed up annually by her oncologist but had not seen him in the last year.  She also states that it has been more than 2 years since her colonoscopy.  Past medical history is also significant for a left knee replacement that due to complications was repeated 1 year ago.  She continues to have pain in her left knee particularly over the medial joint line.  She is due for Pneumovax 23.  She is due for a flu shot.  She is due for the shingles vaccine.  She has not had a mammogram in more than 5 years.  She also had a total abdominal hysterectomy/bilateral salpingo-oophorectomy when she was 62 years old due to fibroids.  She states that both of her ovaries were removed at that time.  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  She has not had a bone density test.  She is not taking any calcium or vitamin D supplement.  She is overdue for a tetanus vaccine.  She is overdue for hepatitis C screening.  She has not had an HIV screening test.  Past Medical History:  Diagnosis Date  . Cancer [facility])    2010 Colon with mets to the liver, partial colectomy, chemo, and liver met resection  . Cataract     Past Surgical History:  Procedure Laterality Date  . ABDOMINAL HYSTERECTOMY     due to fibroids at 35, TAH BSO  . COLON SURGERY    . JOINT REPLACEMENT     TKR x 2 on left knee    . LIVER SURGERY     No current outpatient medications on file prior to visit.   No current facility-administered medications on file prior to visit.    No Known Allergies Social History   Socioeconomic History  . Marital status: Single    Spouse name: Not on file  . Number of children: Not on file  . Years of education: Not on file  . Highest education level: Not on file  Occupational History  . Not on file  Social Needs  . Financial resource strain: Not on file  . Food insecurity:    Worry: Not on file    Inability: Not on file  . Transportation needs:    Medical: Not on file    Non-medical: Not on file  Tobacco Use  . Smoking status: Former Smoker    Types: Cigarettes  . Smokeless tobacco: Former Systems developer    Quit date: 05/19/1985  Substance and Sexual Activity  . Alcohol use: Yes    Alcohol/week: 0.0 standard drinks    Comment: beer every other day  . Drug use: No  . Sexual activity: Not on file  Lifestyle  . Physical activity:    Days per week: Not on file    Minutes per session: Not on file  . Stress: Not  on file  Relationships  . Social connections:    Talks on phone: Not on file    Gets together: Not on file    Attends religious service: Not on file    Active member of club or organization: Not on file    Attends meetings of clubs or organizations: Not on file    Relationship status: Not on file  . Intimate partner violence:    Fear of current or ex partner: Not on file    Emotionally abused: Not on file    Physically abused: Not on file    Forced sexual activity: Not on file  Other Topics Concern  . Not on file  Social History Narrative  . Not on file   No family history on file.    Review of Systems  All other systems reviewed and are negative.      Objective:   Physical Exam  Constitutional: She is oriented to person, place, and time. She appears well-developed and well-nourished. No distress.  HENT:  Head: Normocephalic and atraumatic.   Right Ear: External ear normal.  Left Ear: External ear normal.  Nose: Nose normal.  Mouth/Throat: Oropharynx is clear and moist. No oropharyngeal exudate.  Eyes: Pupils are equal, round, and reactive to light. Conjunctivae and EOM are normal. Right eye exhibits no discharge. Left eye exhibits no discharge. No scleral icterus.  Neck: Normal range of motion. Neck supple. No JVD present. No tracheal deviation present. No thyromegaly present.  Cardiovascular: Normal rate, regular rhythm, normal heart sounds and intact distal pulses. Exam reveals no gallop and no friction rub.  No murmur heard. Pulmonary/Chest: Effort normal and breath sounds normal. No stridor. No respiratory distress. She has no wheezes. She has no rales. She exhibits no tenderness.  Abdominal: Soft. Bowel sounds are normal. She exhibits no distension and no mass. There is no tenderness. There is no rebound and no guarding. No hernia.  Musculoskeletal: Normal range of motion. She exhibits no edema, tenderness or deformity.  Lymphadenopathy:    She has no cervical adenopathy.  Neurological: She is alert and oriented to person, place, and time. She displays normal reflexes. No cranial nerve deficit or sensory deficit. She exhibits normal muscle tone. Coordination normal.  Skin: Skin is warm. Capillary refill takes less than 2 seconds. No rash noted. She is not diaphoretic. No erythema.  Psychiatric: She has a normal mood and affect. Her behavior is normal. Judgment and thought content normal.  Vitals reviewed.         Assessment & Plan:  History of colon cancer - Plan: CA 125, CEA  Encounter for screening for HIV - Plan: HIV antibody  Encounter for hepatitis C screening test for low risk patient - Plan: Hepatitis C Antibody  Screening cholesterol level - Plan: CBC with Differential/Platelet, COMPLETE METABOLIC PANEL WITH GFR, Lipid panel  Chronic pain of left knee - Plan: DG Knee Complete 4 Views Left  Patient appears  to be overdue for a colonoscopy having been 2 to 3 years since her last colonoscopy.  I think it would be wise to arrange follow-up with a local gastroenterologist given that her current gastroenterologist is 5 hours away.  She also needs a local oncologist.  I will order a CEA to monitor for any evidence of recurrence.  I will defer to the oncologist opinion if CT scan of the chest abdomen and pelvis is required now 5 to 6 years out.  I will screen the patient for HIV as well  as hepatitis C.  I will check a CBC, CMP, fasting lipid panel.  I recommended Pneumovax 23 as well as a flu shot which she deferred.  Also recommended the shingles vaccine.  She declined a tetanus shot.  I will obtain an x-ray of the left knee given the pain that she is having.  I erroneously ordered a Ca125 and I will cancel this test.  Given her premature menopause I will also order a bone density test.  The patient is overdue for mammogram.  I will schedule this as well.  Given her hysterectomy, she does not require Pap smear

## 2017-09-25 LAB — CBC WITH DIFFERENTIAL/PLATELET
BASOS PCT: 0.6 %
Basophils Absolute: 41 cells/uL (ref 0–200)
EOS PCT: 0.9 %
Eosinophils Absolute: 62 cells/uL (ref 15–500)
HEMATOCRIT: 39 % (ref 35.0–45.0)
Hemoglobin: 13.1 g/dL (ref 11.7–15.5)
LYMPHS ABS: 1994 {cells}/uL (ref 850–3900)
MCH: 30.8 pg (ref 27.0–33.0)
MCHC: 33.6 g/dL (ref 32.0–36.0)
MCV: 91.8 fL (ref 80.0–100.0)
MPV: 10.9 fL (ref 7.5–12.5)
Monocytes Relative: 6 %
Neutro Abs: 4388 cells/uL (ref 1500–7800)
Neutrophils Relative %: 63.6 %
PLATELETS: 226 10*3/uL (ref 140–400)
RBC: 4.25 10*6/uL (ref 3.80–5.10)
RDW: 11.7 % (ref 11.0–15.0)
Total Lymphocyte: 28.9 %
WBC: 6.9 10*3/uL (ref 3.8–10.8)
WBCMIX: 414 {cells}/uL (ref 200–950)

## 2017-09-25 LAB — COMPLETE METABOLIC PANEL WITH GFR
AG RATIO: 1.6 (calc) (ref 1.0–2.5)
ALT: 13 U/L (ref 6–29)
AST: 19 U/L (ref 10–35)
Albumin: 4.1 g/dL (ref 3.6–5.1)
Alkaline phosphatase (APISO): 73 U/L (ref 33–130)
BUN: 12 mg/dL (ref 7–25)
CO2: 23 mmol/L (ref 20–32)
CREATININE: 0.96 mg/dL (ref 0.50–0.99)
Calcium: 9.2 mg/dL (ref 8.6–10.4)
Chloride: 106 mmol/L (ref 98–110)
GFR, EST AFRICAN AMERICAN: 73 mL/min/{1.73_m2} (ref >=60)
GFR, EST NON AFRICAN AMERICAN: 63 mL/min/{1.73_m2} (ref >=60)
Globulin: 2.6 g/dL (calc) (ref 1.9–3.7)
Glucose, Bld: 87 mg/dL (ref 65–99)
POTASSIUM: 4.2 mmol/L (ref 3.5–5.3)
Sodium: 141 mmol/L (ref 135–146)
TOTAL PROTEIN: 6.7 g/dL (ref 6.1–8.1)
Total Bilirubin: 0.4 mg/dL (ref 0.2–1.2)

## 2017-09-25 LAB — LIPID PANEL
CHOL/HDL RATIO: 2.7 (calc) (ref <5.0)
Cholesterol: 149 mg/dL (ref <200)
HDL: 56 mg/dL (ref >50)
LDL CHOLESTEROL (CALC): 73 mg/dL
NON-HDL CHOLESTEROL (CALC): 93 mg/dL (ref <130)
TRIGLYCERIDES: 114 mg/dL (ref <150)

## 2017-09-25 LAB — HEPATITIS C ANTIBODY
HEP C AB: NONREACTIVE
SIGNAL TO CUT-OFF: 0.01 (ref <1.00)

## 2017-09-25 LAB — CA 125

## 2017-09-25 LAB — HIV ANTIBODY (ROUTINE TESTING W REFLEX): HIV: NONREACTIVE

## 2017-09-27 LAB — CEA

## 2017-10-29 ENCOUNTER — Encounter: Payer: Self-pay | Admitting: *Deleted

## 2017-10-29 DIAGNOSIS — F329 Major depressive disorder, single episode, unspecified: Secondary | ICD-10-CM | POA: Insufficient documentation

## 2017-10-29 DIAGNOSIS — I639 Cerebral infarction, unspecified: Secondary | ICD-10-CM | POA: Insufficient documentation

## 2017-10-29 DIAGNOSIS — F32A Depression, unspecified: Secondary | ICD-10-CM | POA: Insufficient documentation

## 2017-10-30 DIAGNOSIS — Z23 Encounter for immunization: Secondary | ICD-10-CM | POA: Diagnosis not present

## 2017-11-13 ENCOUNTER — Encounter: Payer: Self-pay | Admitting: Family Medicine

## 2017-11-16 ENCOUNTER — Other Ambulatory Visit: Payer: Medicare Other

## 2017-11-16 ENCOUNTER — Ambulatory Visit: Payer: Medicare Other

## 2017-12-07 ENCOUNTER — Ambulatory Visit (HOSPITAL_COMMUNITY)
Admission: EM | Admit: 2017-12-07 | Discharge: 2017-12-07 | Disposition: A | Discharge status: Home / Self Care | Payer: PRIVATE HEALTH INSURANCE | Attending: Family Medicine | Admitting: Family Medicine

## 2017-12-07 ENCOUNTER — Ambulatory Visit (INDEPENDENT_AMBULATORY_CARE_PROVIDER_SITE_OTHER): Payer: PRIVATE HEALTH INSURANCE

## 2017-12-07 ENCOUNTER — Encounter (HOSPITAL_COMMUNITY): Payer: Self-pay

## 2017-12-07 DIAGNOSIS — Z9889 Other specified postprocedural states: Secondary | ICD-10-CM | POA: Insufficient documentation

## 2017-12-07 DIAGNOSIS — Z96652 Presence of left artificial knee joint: Secondary | ICD-10-CM | POA: Insufficient documentation

## 2017-12-07 DIAGNOSIS — Z79899 Other long term (current) drug therapy: Secondary | ICD-10-CM | POA: Diagnosis not present

## 2017-12-07 DIAGNOSIS — R197 Diarrhea, unspecified: Secondary | ICD-10-CM | POA: Diagnosis not present

## 2017-12-07 DIAGNOSIS — R5383 Other fatigue: Secondary | ICD-10-CM | POA: Diagnosis present

## 2017-12-07 DIAGNOSIS — R112 Nausea with vomiting, unspecified: Secondary | ICD-10-CM

## 2017-12-07 DIAGNOSIS — Z809 Family history of malignant neoplasm, unspecified: Secondary | ICD-10-CM | POA: Insufficient documentation

## 2017-12-07 DIAGNOSIS — Z85038 Personal history of other malignant neoplasm of large intestine: Secondary | ICD-10-CM | POA: Diagnosis not present

## 2017-12-07 DIAGNOSIS — Z87891 Personal history of nicotine dependence: Secondary | ICD-10-CM | POA: Diagnosis not present

## 2017-12-07 DIAGNOSIS — E860 Dehydration: Secondary | ICD-10-CM | POA: Insufficient documentation

## 2017-12-07 DIAGNOSIS — Z8505 Personal history of malignant neoplasm of liver: Secondary | ICD-10-CM | POA: Diagnosis not present

## 2017-12-07 DIAGNOSIS — A084 Viral intestinal infection, unspecified: Secondary | ICD-10-CM | POA: Insufficient documentation

## 2017-12-07 LAB — COMPREHENSIVE METABOLIC PANEL
ALK PHOS: 70 U/L (ref 38–126)
ALT: 18 U/L (ref 0–44)
AST: 21 U/L (ref 15–41)
Albumin: 3.7 g/dL (ref 3.5–5.0)
Anion gap: 5 (ref 5–15)
BILIRUBIN TOTAL: 0.9 mg/dL (ref 0.3–1.2)
BUN: 8 mg/dL (ref 8–23)
CALCIUM: 9 mg/dL (ref 8.9–10.3)
CO2: 29 mmol/L (ref 22–32)
Chloride: 108 mmol/L (ref 98–111)
Creatinine, Ser: 0.92 mg/dL (ref 0.44–1.00)
GFR calc Af Amer: >60 mL/min (ref >60)
Glucose, Bld: 92 mg/dL (ref 70–99)
POTASSIUM: 3.6 mmol/L (ref 3.5–5.1)
Sodium: 142 mmol/L (ref 135–145)
TOTAL PROTEIN: 6.8 g/dL (ref 6.5–8.1)

## 2017-12-07 LAB — CBC WITH DIFFERENTIAL/PLATELET
Abs Immature Granulocytes: 0.01 10*3/uL (ref 0.00–0.07)
BASOS PCT: 1 %
Basophils Absolute: 0 10*3/uL (ref 0.0–0.1)
EOS ABS: 0.1 10*3/uL (ref 0.0–0.5)
EOS PCT: 1 %
HCT: 40.2 % (ref 36.0–46.0)
Hemoglobin: 13.2 g/dL (ref 12.0–15.0)
Immature Granulocytes: 0 %
Lymphocytes Relative: 29 %
Lymphs Abs: 1.7 10*3/uL (ref 0.7–4.0)
MCH: 30.8 pg (ref 26.0–34.0)
MCHC: 32.8 g/dL (ref 30.0–36.0)
MCV: 93.9 fL (ref 80.0–100.0)
MONO ABS: 0.4 10*3/uL (ref 0.1–1.0)
MONOS PCT: 6 %
Neutro Abs: 3.7 10*3/uL (ref 1.7–7.7)
Neutrophils Relative %: 63 %
PLATELETS: 226 10*3/uL (ref 150–400)
RBC: 4.28 MIL/uL (ref 3.87–5.11)
RDW: 12.1 % (ref 11.5–15.5)
WBC: 5.9 10*3/uL (ref 4.0–10.5)
nRBC: 0 % (ref 0.0–0.2)

## 2017-12-07 IMAGING — DX DG ABDOMEN ACUTE W/ 1V CHEST
4 series · 4 of 4 positions shown · non-contrast
Comparison: CT [DATE].  Chest x-ray [DATE].

CLINICAL DATA: Nausea, vomiting, diarrhea.

EXAM:
DG ABDOMEN ACUTE W/ 1V CHEST

[chest pa]
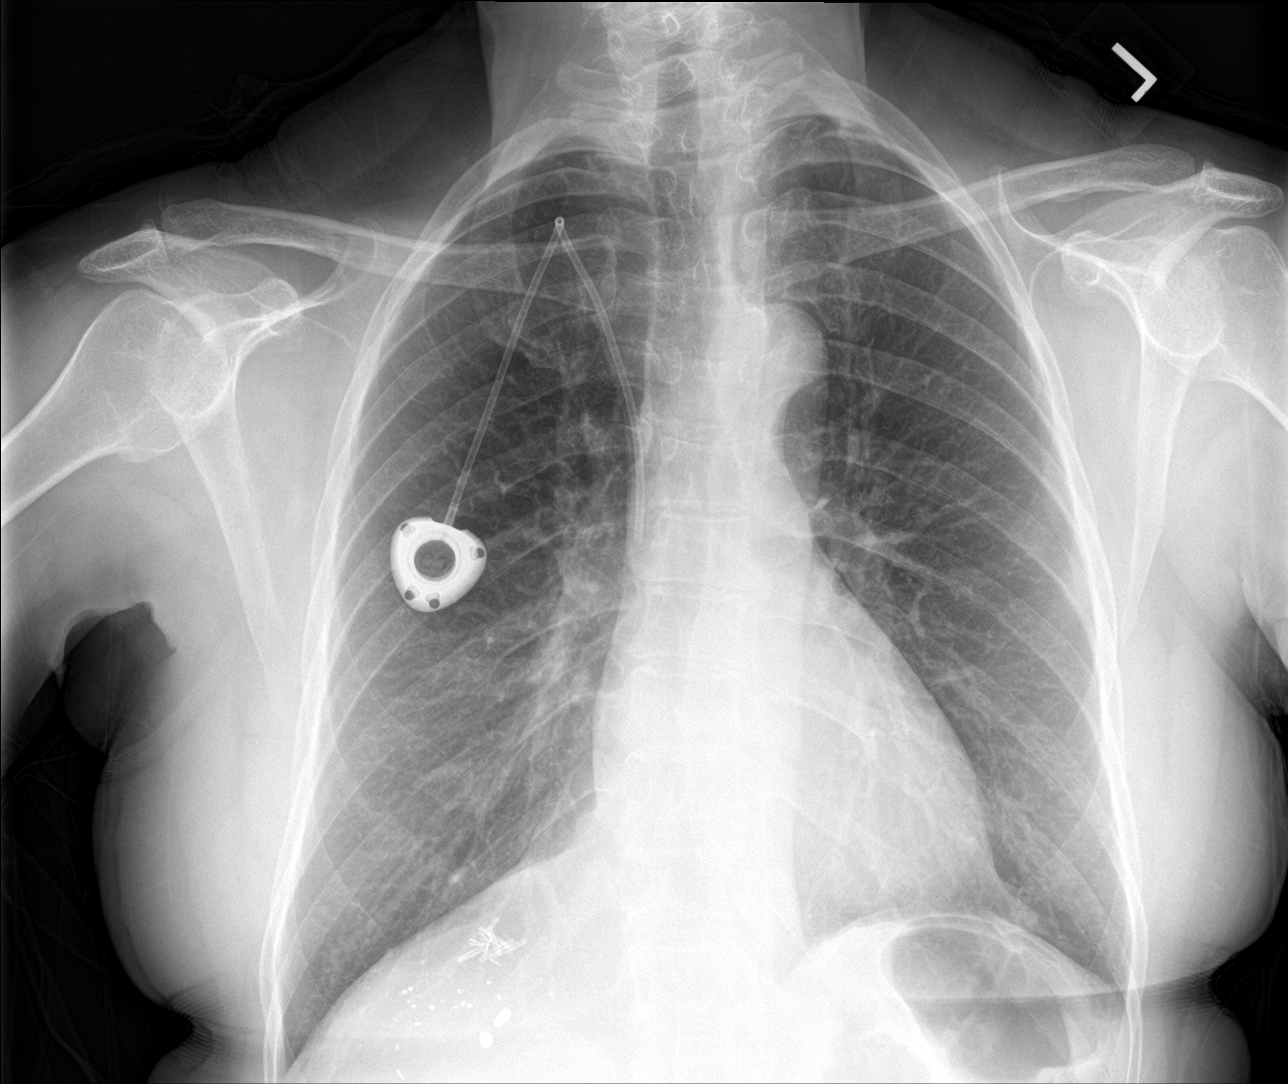

[abdomen erect]
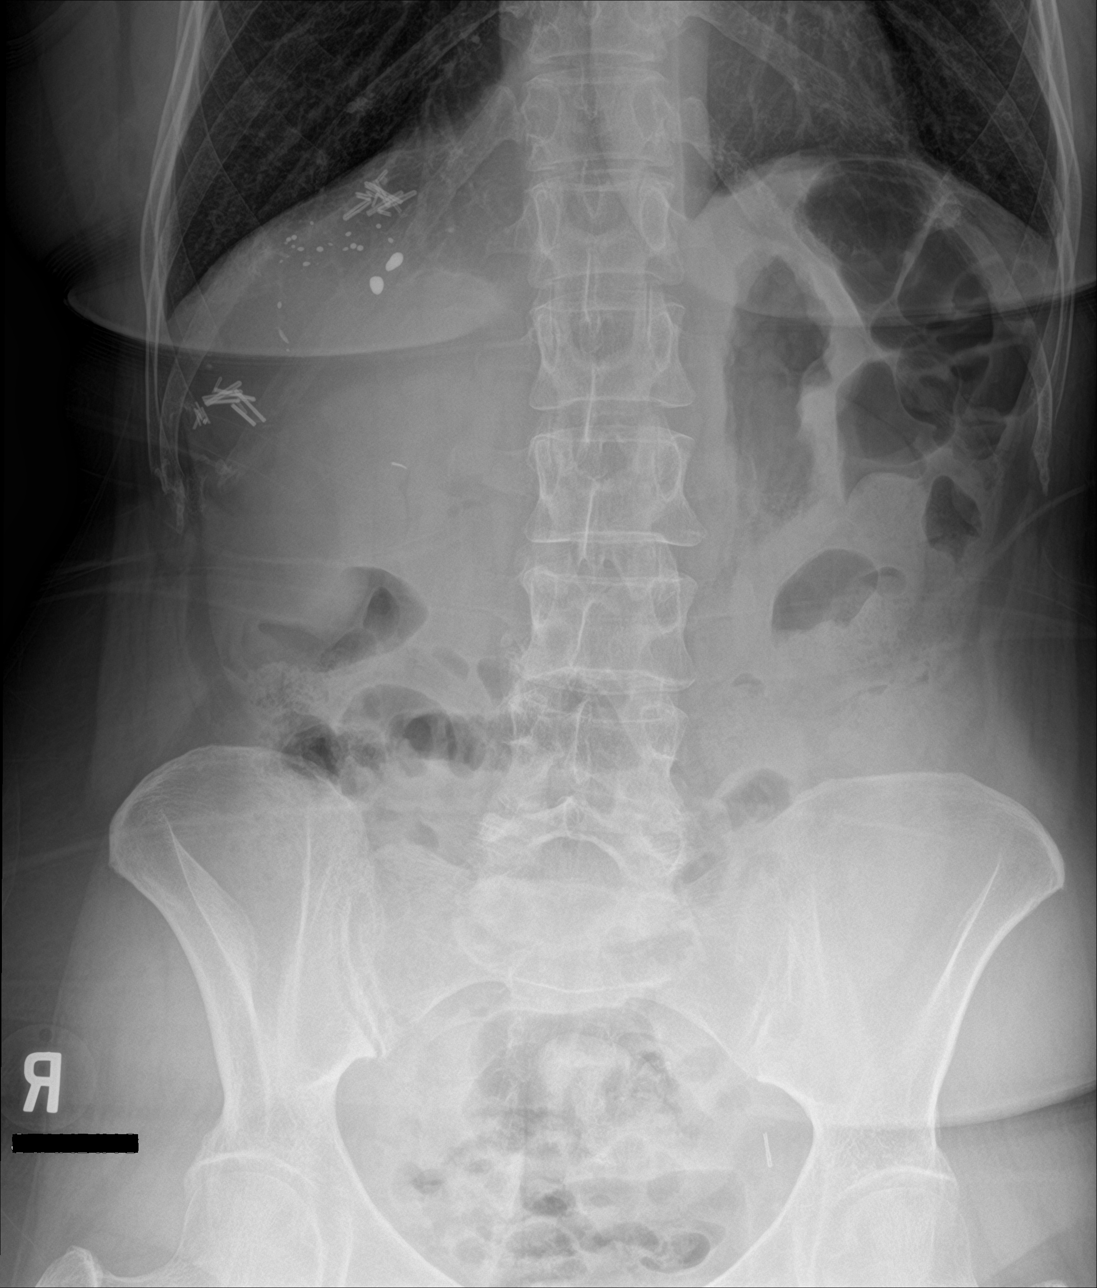

[abdomen supine (1 of 2)]
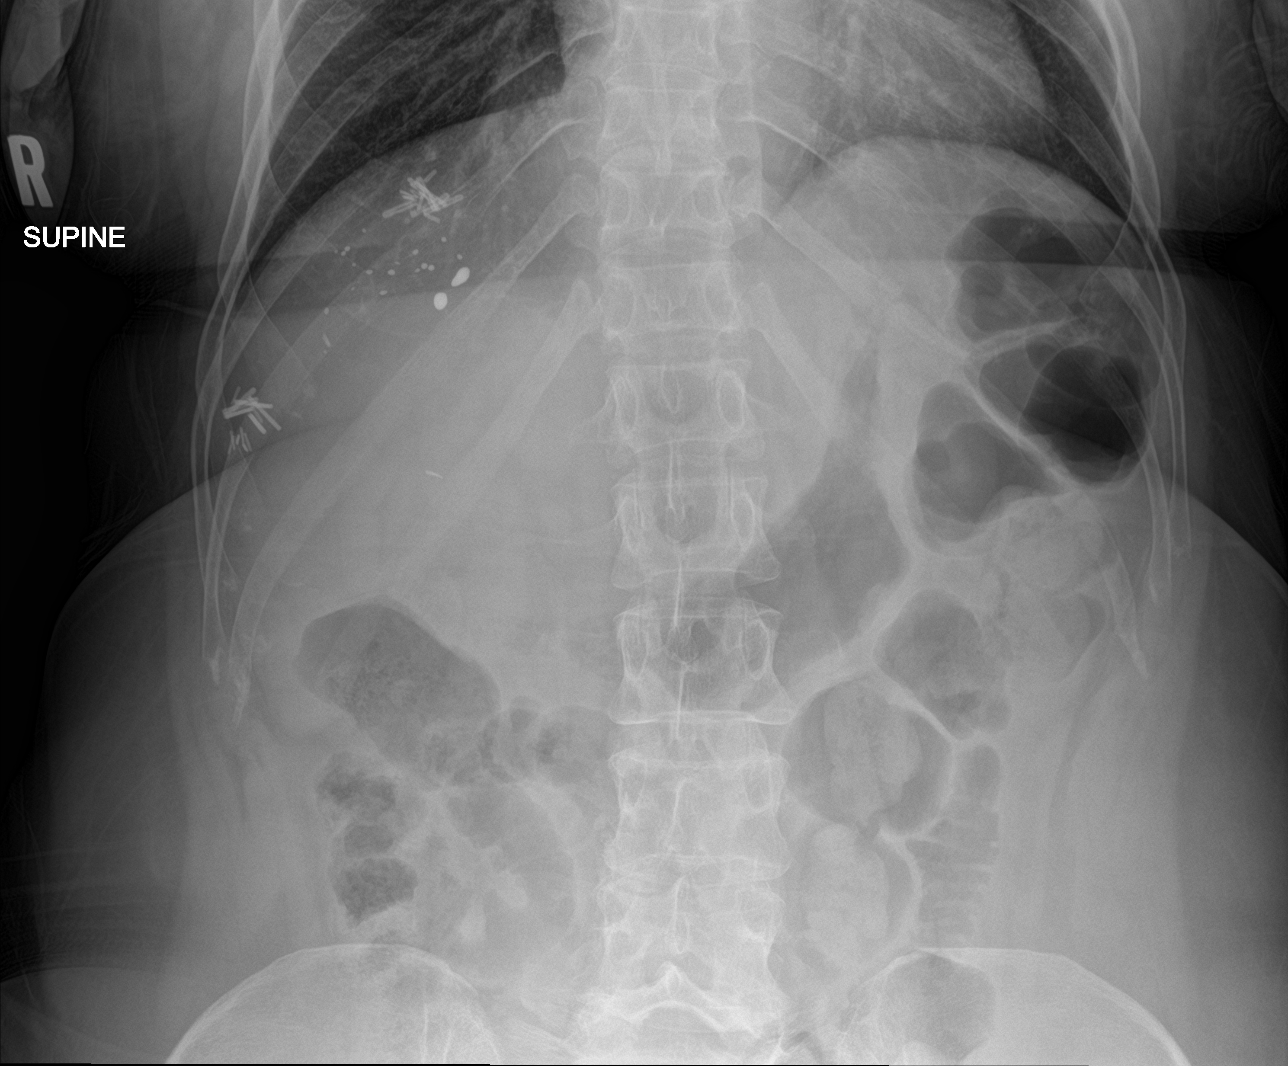

[abdomen supine (2 of 2)]
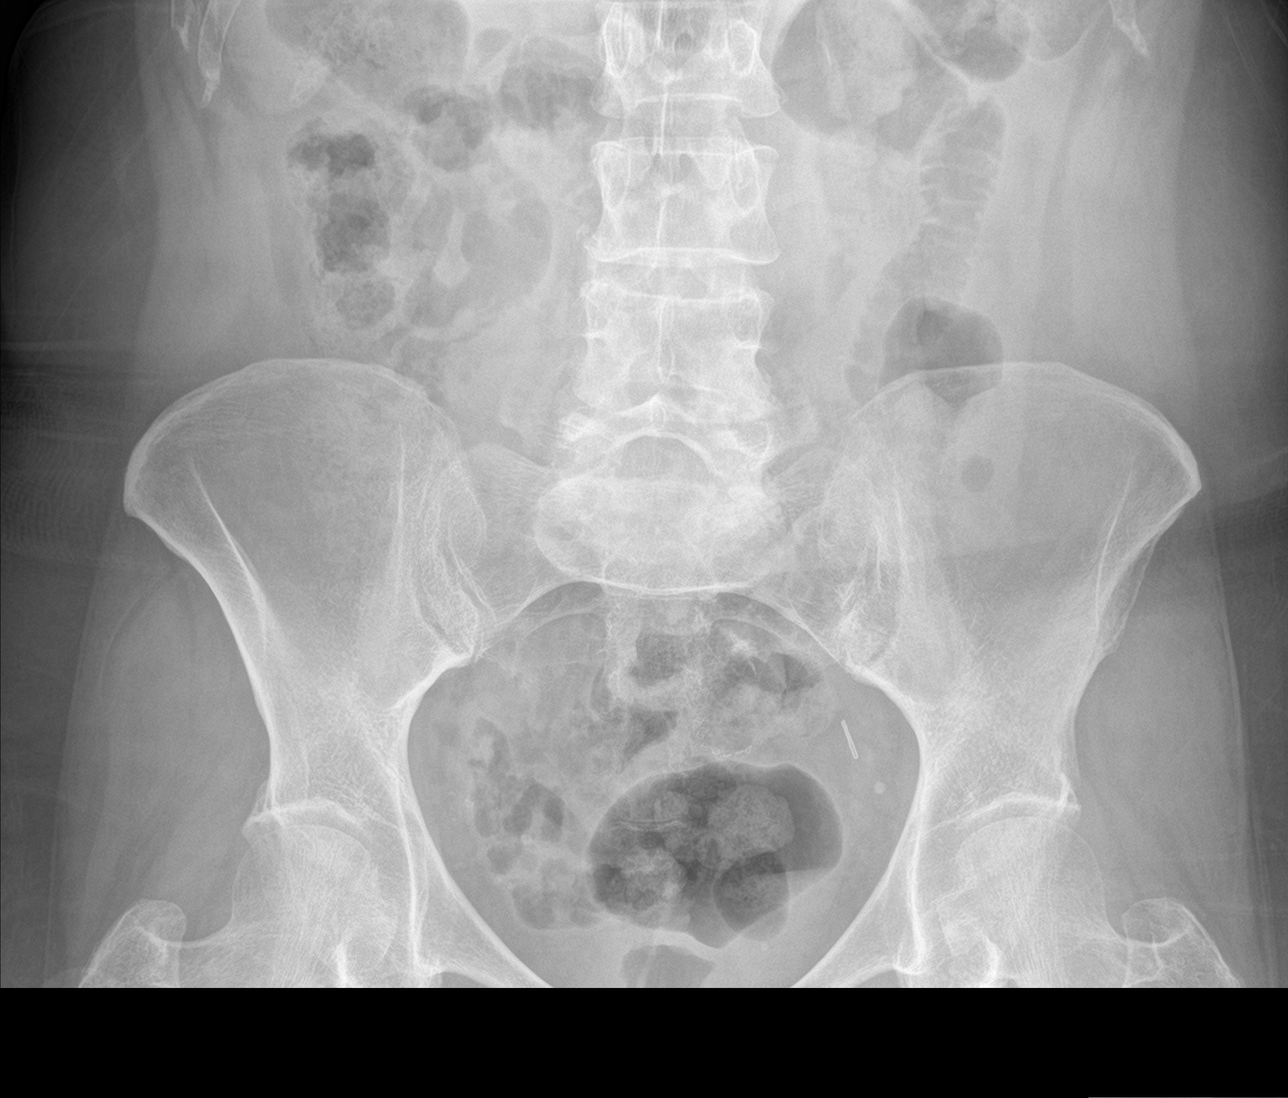

[4 of 4 positions shown; findings below may reference images not displayed]

FINDINGS: PowerPort catheter with tip over superior vena cava. Heart size
normal. No focal infiltrate. Mild bibasilar subsegmental axis. No
pleural effusion or pneumothorax. Postsurgical changes of the liver
again noted. Surgical clips in the pelvis. Soft tissue structures
over the abdomen are unremarkable. Several air-filled loops of small
bowel are noted. These are nondistended. Colon is nondistended. No
free air is identified. No acute bony abnormality. Thoracolumbar
spine scoliosis. Degenerative changes both hips.
IMPRESSION: 1. PowerPort catheter with tip over superior vena cava. No acute
cardiopulmonary disease.

2. Several air-filled loops of nondilated small bowel noted. Colonic
gas pattern is normal. An active small-bowel process such as
enteritis could present in this fashion. Follow-up exam to
demonstrate clearing can be obtained.

## 2017-12-07 MED ORDER — SODIUM CHLORIDE 0.9 % IV BOLUS
1000.0000 mL | Freq: Once | INTRAVENOUS | Status: AC
  Administered 2017-12-07: 1000 mL via INTRAVENOUS

## 2017-12-07 MED ORDER — ONDANSETRON HCL 4 MG PO TABS: 4.0000 mg | ORAL_TABLET | Freq: Four times a day (QID) | ORAL | 0 refills | Status: DC

## 2017-12-07 MED ORDER — ONDANSETRON HCL 4 MG/2ML IJ SOLN
4.0000 mg | Freq: Once | INTRAMUSCULAR | Status: AC
  Administered 2017-12-07: 4 mg via INTRAMUSCULAR

## 2017-12-07 MED ORDER — ONDANSETRON HCL 4 MG/2ML IJ SOLN
INTRAMUSCULAR | Status: AC
  Filled 2017-12-07: qty 2

## 2017-12-07 NOTE — ED Notes (Signed)
Pt unable to give a urine sample.  Per Dr. Meda Coffee, ok to send pt home without collecting it.

## 2017-12-07 NOTE — ED Provider Notes (Signed)
[place]    CSN: [id number] Arrival date & time: 12/07/17  0854     History   Chief Complaint Chief Complaint  Patient presents with  . NVD  . Fatigue  . Abdominal Pain    HPI Kathryn Zavala is a 62 y.o. female.   HPI  Patient is here for nausea vomiting diarrhea fatigue and abdominal pain.  She states that she is started having nausea and vomiting on Monday.  After that she developed loose bowels.  She is has multiple loose bowels a day.  She states it is soft but not watery.  Only small amounts, she states that she has had colon surgery for colon cancer.  It was metastatic to her liver.  This was years ago.  After her surgeries (4) and chemotherapy she was declared clear of cancer.  She recently had a CEA test which was low.  She is due for colonoscopy.  She states that she vomited this morning.  She is been able unable to keep down fluids.  She feels dehydrated.  Feels dizzy upon standing.  She drove herself here to the office. She was seen by her family doctor and August for physical examination.  Lab work at that time was all negative. The abdominal pain is crampy and intermittent.  It is worse after she tries to eat or drink anything. She has had no recent travel.  She did not eat anything that she thinks would agree with her.  She has no food allergies.  No known exposure to stomach flu.  No sweats or chills to indicate fever.   Past Medical History:  Diagnosis Date  . Cancer [facility])    2010 Colon with mets to the liver, partial colectomy, chemo, and liver met resection  . Cataract     Patient Active Problem List   Diagnosis Date Noted  . Stroke ([place]) 10/29/2017  . Depression 10/29/2017  . Paronychia of finger of right hand 12/21/2016  . Presence of left artificial knee joint 09/04/2013  . Knee pain 11/20/2012  . Colon cancer ([place]) 07/16/2012  . Metastasis to liver ([place]) 03/09/2009    Past Surgical History:  Procedure Laterality Date  .  ABDOMINAL HYSTERECTOMY     due to fibroids at 35, TAH BSO  . COLON SURGERY    . JOINT REPLACEMENT     TKR x 2 on left knee  . LIVER SURGERY      OB History   None      Home Medications    Prior to Admission medications   Medication Sig Start Date End Date Taking? Authorizing Provider  ondansetron (ZOFRAN) 4 MG tablet Take 1 tablet (4 mg total) by mouth every 6 (six) hours. 12/07/17   Raylene Everts, MD    Family History History reviewed. No pertinent family history. Family history of cancer.  No heart disease. Social History Social History   Tobacco Use  . Smoking status: Former Smoker    Types: Cigarettes  . Smokeless tobacco: Former Systems developer    Quit date: 05/19/1985  Substance Use Topics  . Alcohol use: Yes    Alcohol/week: 0.0 standard drinks    Comment: beer every other day  . Drug use: No     Allergies   Patient has no known allergies.   Review of Systems Review of Systems  Constitutional: Positive for fatigue. Negative for chills and fever.  HENT: Negative for ear pain and sore throat.   Eyes: Negative for pain and  visual disturbance.  Respiratory: Negative for cough and shortness of breath.   Cardiovascular: Negative for chest pain and palpitations.  Gastrointestinal: Positive for abdominal pain, diarrhea, nausea and vomiting. Negative for blood in stool.  Genitourinary: Negative for dysuria and hematuria.  Musculoskeletal: Negative for arthralgias and back pain.  Skin: Negative for color change and rash.  Neurological: Positive for light-headedness. Negative for seizures and syncope.  All other systems reviewed and are negative.    Physical Exam Triage Vital Signs ED Triage Vitals  Enc Vitals Group     BP 12/07/17 0937 134/79     Pulse Rate 12/07/17 0937 61     Resp 12/07/17 0937 16     Temp 12/07/17 0937 98.2 F (36.8 C)     Temp Source 12/07/17 0937 Oral     SpO2 12/07/17 0937 100 %     Weight --      Height --      Head Circumference  --      Peak Flow --      Pain Score 12/07/17 0939 6     Pain Loc --      Pain Edu? --      Excl. in [place]? --    No data found.  Updated Vital Signs BP 134/79 (BP Location: Left Arm)   Pulse 61   Temp 98.2 F (36.8 C) (Oral)   Resp 16   SpO2 100%       Physical Exam  Constitutional: She appears well-developed and well-nourished. She appears ill. No distress.  HENT:  Head: Normocephalic and atraumatic.  Mouth/Throat: Oropharynx is clear and moist.  Mucous membranes slightly dry  Eyes: Pupils are equal, round, and reactive to light. Conjunctivae are normal.  Neck: Normal range of motion.  Cardiovascular: Normal rate and normal heart sounds.  Pulmonary/Chest: Effort normal and breath sounds normal. No respiratory distress.  Abdominal: Soft. Bowel sounds are normal. She exhibits no distension. There is no hepatosplenomegaly. There is generalized tenderness. There is no rigidity, no rebound and no guarding.  Musculoskeletal: Normal range of motion. She exhibits no edema.  Neurological: She is alert.  Skin: Skin is warm and dry.  Psychiatric: She has a normal mood and affect. Her behavior is normal.     UC Treatments / Results  Labs (all labs ordered are listed, but only abnormal results are displayed) Labs Reviewed  POCT I-STAT, CHEM 8 - Abnormal; Notable for the following components:      Result Value   Potassium 7.7 (*)    Calcium, Ion 1.06 (*)    TCO2 33 (*)    All other components within normal limits  COMPREHENSIVE METABOLIC PANEL  CBC WITH DIFFERENTIAL/PLATELET    EKG None  Radiology Dg Abdomen Acute W/chest  Result Date: 12/07/2017 CLINICAL DATA: Nausea, vomiting, diarrhea. EXAM: DG ABDOMEN ACUTE W/ 1V CHEST COMPARISON:  CT 05/19/2016.  Chest x-ray 05/19/2017. FINDINGS: PowerPort catheter with tip over superior vena cava. Heart size normal. No focal infiltrate. Mild bibasilar subsegmental axis. No pleural effusion or pneumothorax. Postsurgical changes of the  liver again noted. Surgical clips in the pelvis. Soft tissue structures over the abdomen are unremarkable. Several air-filled loops of small bowel are noted. These are nondistended. Colon is nondistended. No free air is identified. No acute bony abnormality. Thoracolumbar spine scoliosis. Degenerative changes both hips. IMPRESSION: 1. PowerPort catheter with tip over superior vena cava. No acute cardiopulmonary disease. 2. Several air-filled loops of nondilated small bowel noted. Colonic gas pattern is normal.  An active small-bowel process such as enteritis could present in this fashion. Follow-up exam to demonstrate clearing can be obtained. Electronically Signed   By: Marcello Moores  Register   On: 12/07/2017 10:55    Procedures Procedures (including critical care time)  Medications Ordered in UC Medications  sodium chloride 0.9 % bolus 1,000 mL (0 mLs Intravenous Stopped 12/07/17 1149)  ondansetron (ZOFRAN) injection 4 mg (4 mg Intramuscular Given 12/07/17 1018)    Initial Impression / Assessment and Plan / UC Course  I have reviewed the triage vital signs and the nursing notes.  Pertinent labs & imaging results that were available during my care of the patient were reviewed by me and considered in my medical decision making (see chart for details).    Initial potassium is over 7.  I concern for spurious testing from hemolysis versus dehydration that had caused her kidney function to fail and potassium relation.  I went ahead and did a conference of metabolic panel.  This took 2 hours to come back.  The repeat potassium was 3.6.  At this point she was discharged.  She felt much better after a liter of fluids and treatment.  Her daughter was here to help care for her for the weekend.  Final Clinical Impressions(s) / UC Diagnoses   Final diagnoses:  Dehydration  Viral gastroenteritis     Discharge Instructions     Home to rest Push fluids Take Zofran for nausea and vomiting Gradually  introduce solid foods. Stay away from spicy foods and fried or fatty foods for at least a week You may take over-the-counter medicine for diarrhea if you need to.  Imodium works well.  You could also try Pepto-Bismol. Expect improvement over the weekend.  You may try to go back to work on Monday. If you get worse instead of better or continued vomiting, either return here to the emergency department for care    ED Prescriptions    Medication Sig Dispense Auth. Provider   ondansetron (ZOFRAN) 4 MG tablet Take 1 tablet (4 mg total) by mouth every 6 (six) hours. 12 tablet Raylene Everts, MD     Controlled Substance Prescriptions [place] Controlled Substance Registry consulted? Not Applicable   Raylene Everts, MD 12/07/17 2159

## 2017-12-07 NOTE — ED Triage Notes (Signed)
Pt presents with nausea, vomiting, diarrhea, fatigue and abdominal cramping.

## 2017-12-07 NOTE — Discharge Instructions (Signed)
Home to rest Push fluids Take Zofran for nausea and vomiting Gradually introduce solid foods. Stay away from spicy foods and fried or fatty foods for at least a week You may take over-the-counter medicine for diarrhea if you need to.  Imodium works well.  You could also try Pepto-Bismol. Expect improvement over the weekend.  You may try to go back to work on Monday. If you get worse instead of better or continued vomiting, either return here to the emergency department for care

## 2017-12-10 LAB — POCT I-STAT, CHEM 8
BUN: 13 mg/dL (ref 8–23)
Calcium, Ion: 1.06 mmol/L — ABNORMAL LOW (ref 1.15–1.40)
Chloride: 105 mmol/L (ref 98–111)
Creatinine, Ser: 1 mg/dL (ref 0.44–1.00)
GLUCOSE: 90 mg/dL (ref 70–99)
HCT: 41 % (ref 36.0–46.0)
Hemoglobin: 13.9 g/dL (ref 12.0–15.0)
Potassium: 7.7 mmol/L (ref 3.5–5.1)
SODIUM: 139 mmol/L (ref 135–145)
TCO2: 33 mmol/L — AB (ref 22–32)

## 2018-02-12 ENCOUNTER — Encounter: Payer: Self-pay | Admitting: Family Medicine

## 2018-02-12 ENCOUNTER — Ambulatory Visit (INDEPENDENT_AMBULATORY_CARE_PROVIDER_SITE_OTHER): Payer: 59 | Admitting: Family Medicine

## 2018-02-12 ENCOUNTER — Ambulatory Visit
Admission: RE | Admit: 2018-02-12 | Discharge: 2018-02-12 | Disposition: A | Discharge status: Home / Self Care | Payer: Medicare Other | Source: Ambulatory Visit | Attending: Family Medicine | Admitting: Family Medicine

## 2018-02-12 VITALS — BP 130/80 | HR 64 | Temp 97.6°F | Resp 14 | Ht 69.0 in | Wt 200.0 lb

## 2018-02-12 DIAGNOSIS — M79605 Pain in left leg: Secondary | ICD-10-CM

## 2018-02-12 DIAGNOSIS — M79662 Pain in left lower leg: Secondary | ICD-10-CM | POA: Diagnosis not present

## 2018-02-12 IMAGING — DX DG TIBIA/FIBULA 2V*L*
4 series · 4 of 4 positions shown · non-contrast
Comparison: [DATE]

CLINICAL DATA: Left lower leg pain, no known injury, initial
encounter

EXAM:
LEFT TIBIA AND FIBULA - 2 VIEW

[dg tibia/fibula left (1 of 4)]
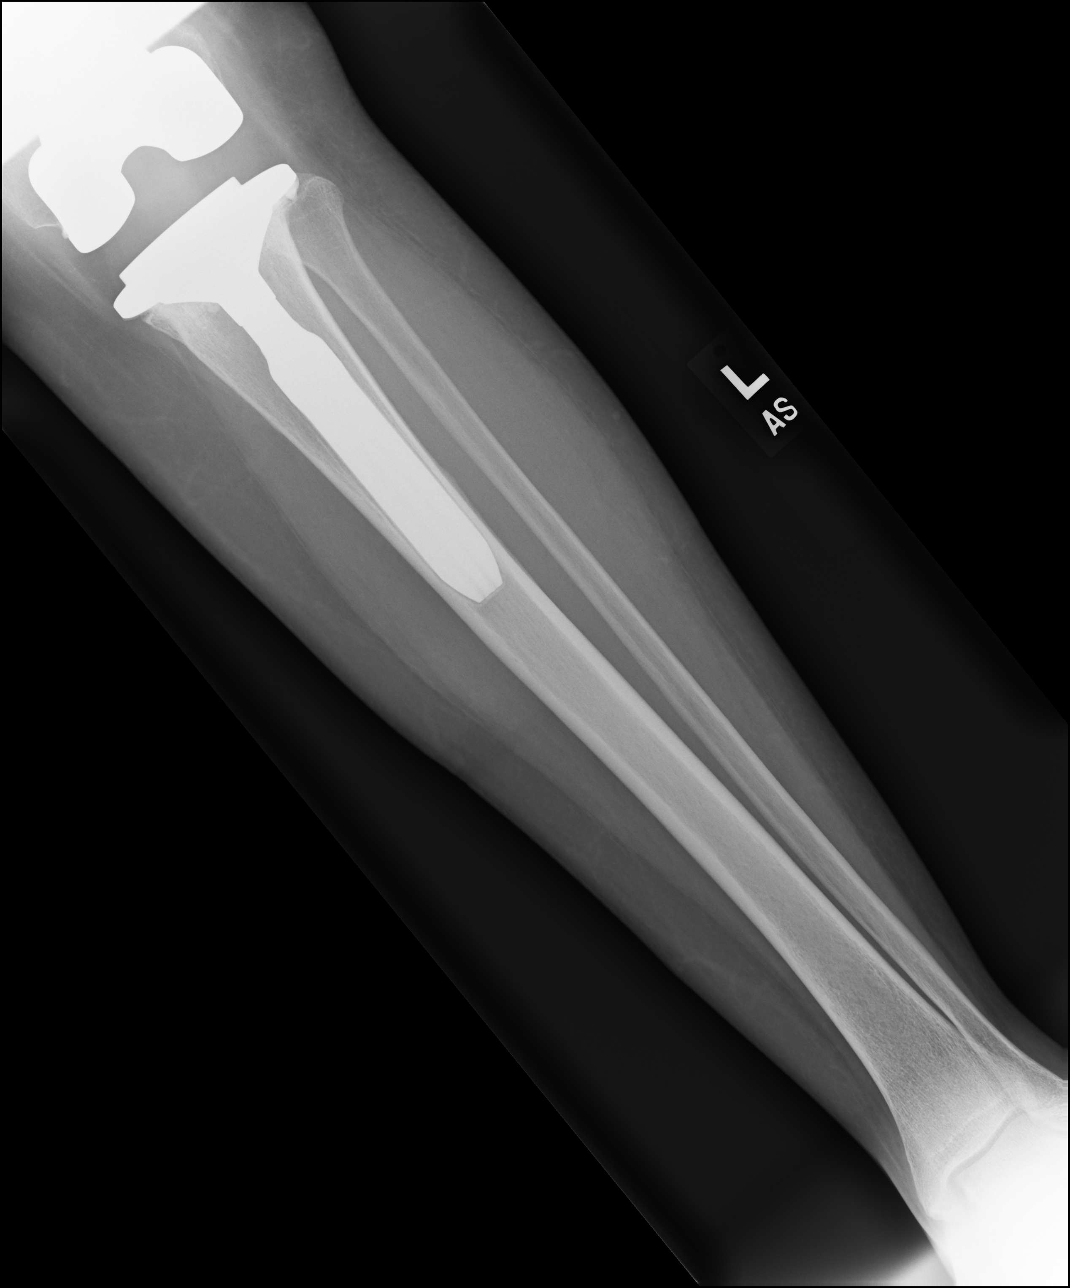

[dg tibia/fibula left (2 of 4)]
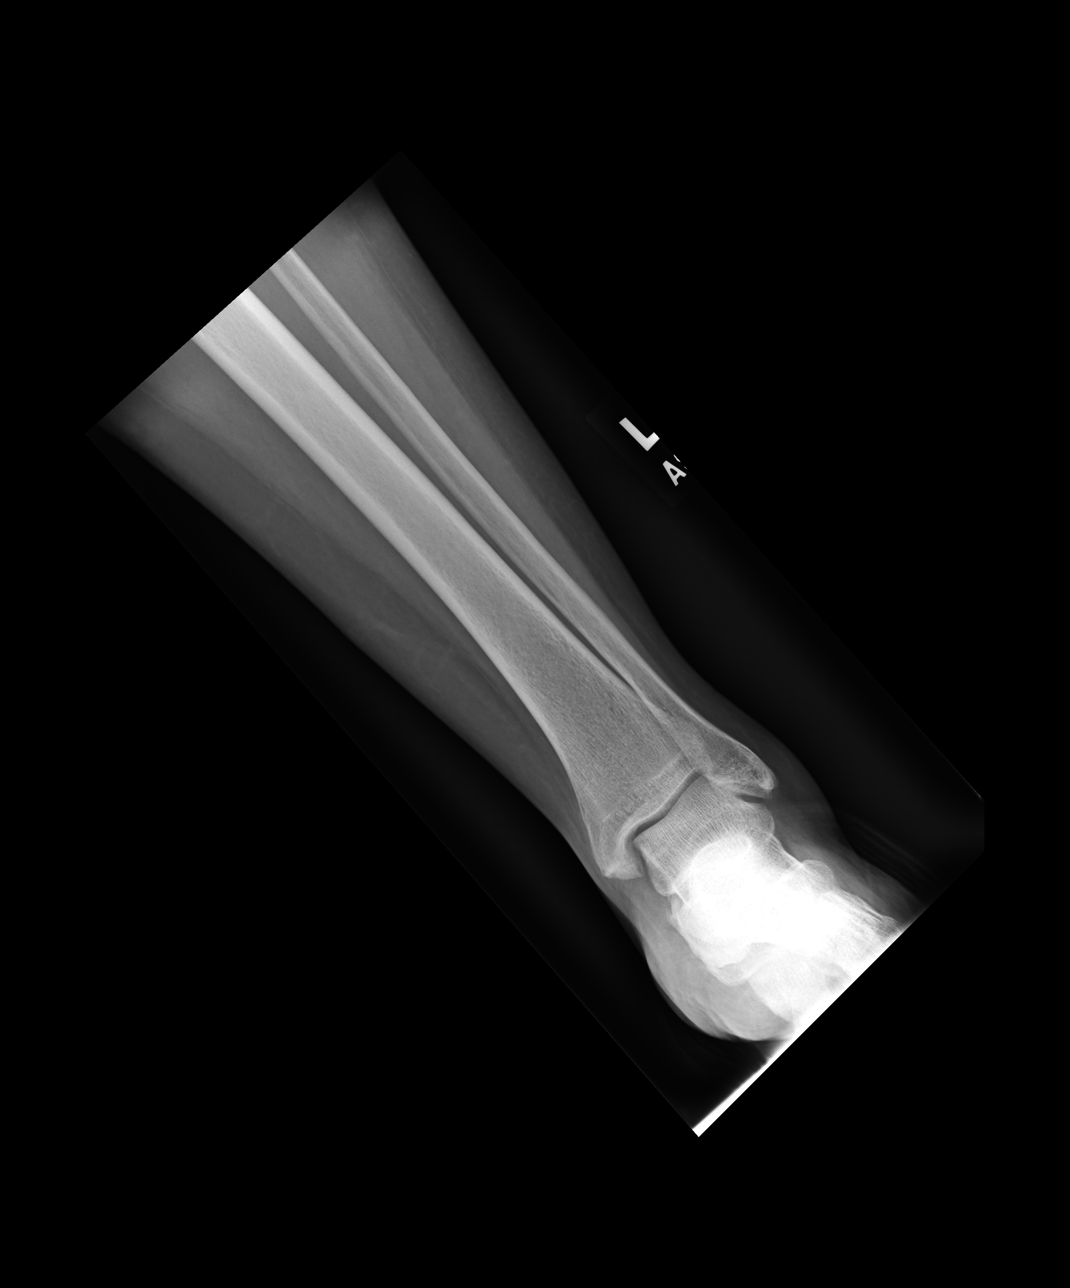

[dg tibia/fibula left (3 of 4)]
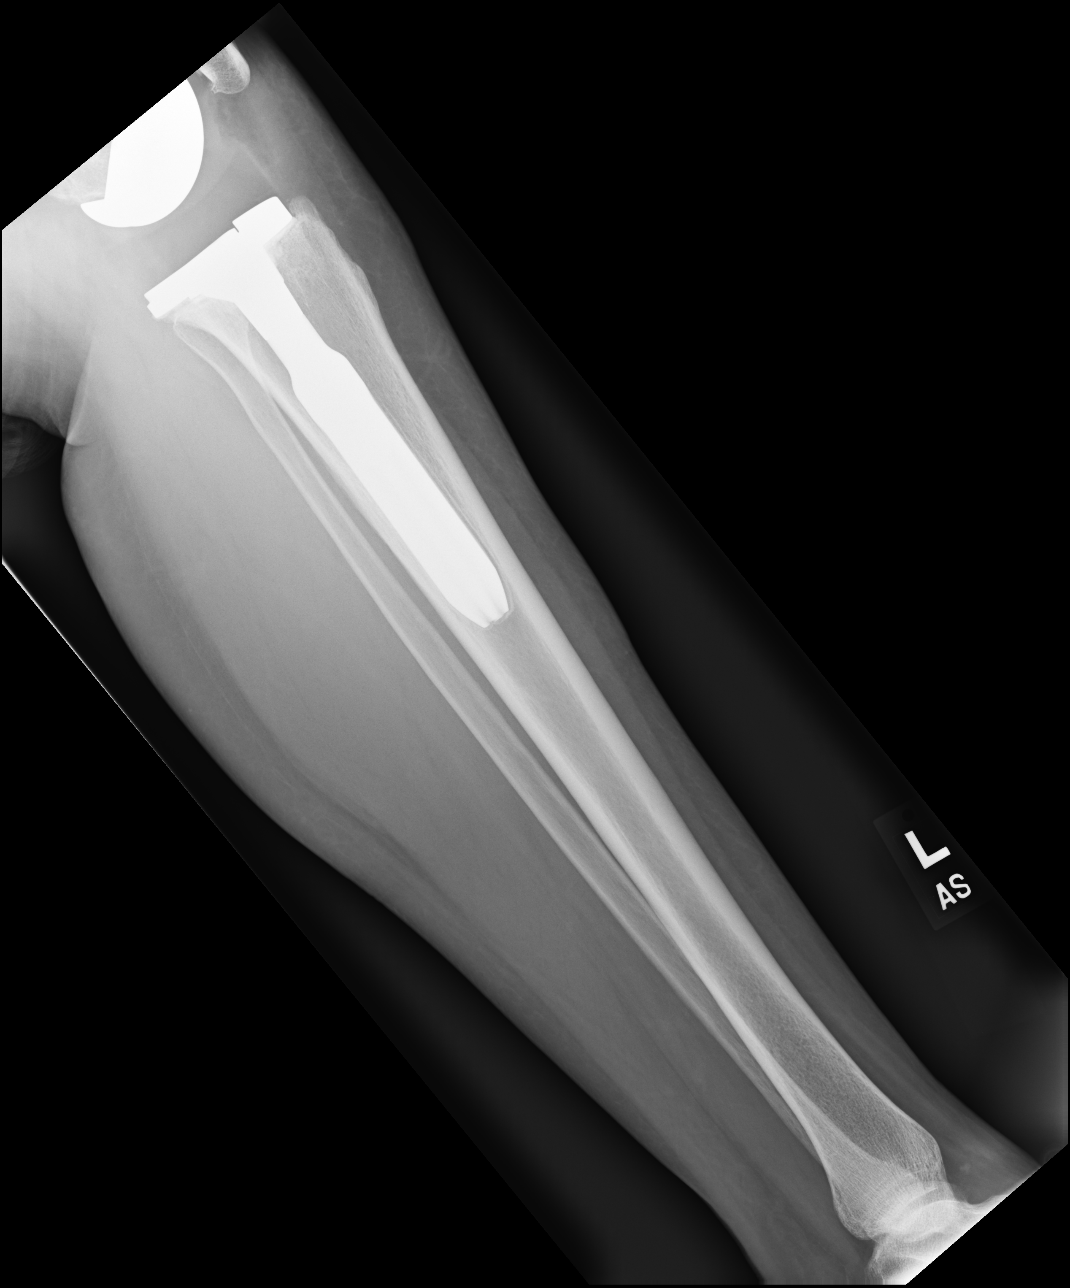

[dg tibia/fibula left (4 of 4)]
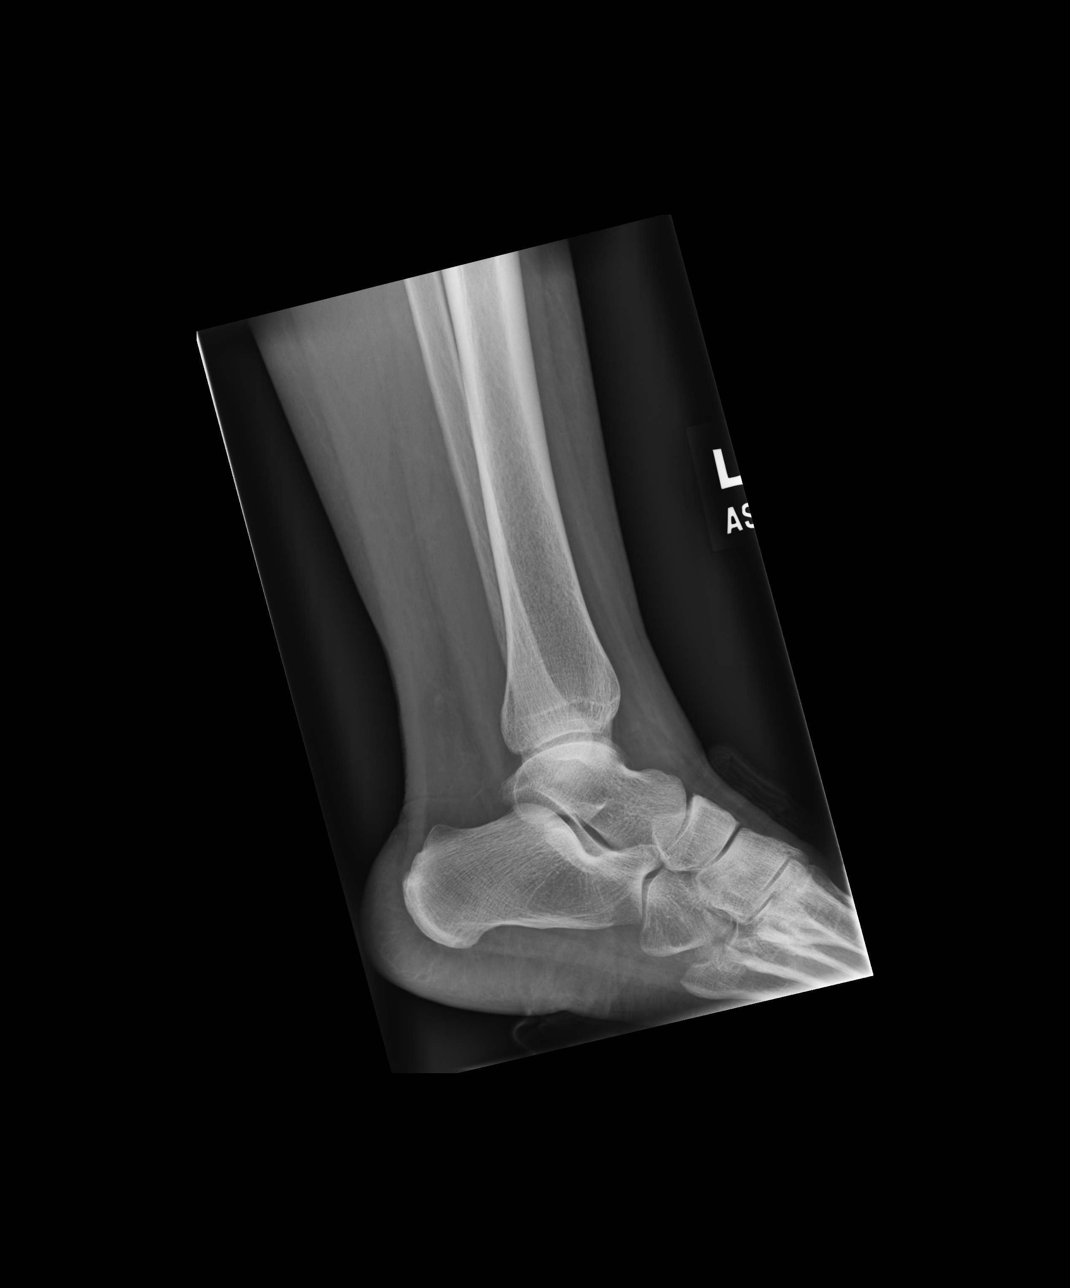

[4 of 4 positions shown; findings below may reference images not displayed]

FINDINGS: Left knee prosthesis is again noted. No acute fracture or
dislocation is seen. No soft tissue abnormality is noted. No lytic
or sclerotic lesions are noted.
IMPRESSION: Postsurgical change.  No acute abnormality noted.

## 2018-02-12 NOTE — Progress Notes (Signed)
Subjective:    Patient ID: Kathryn Zavala, female    DOB: 1955/03/14, 63 y.o.   MRN: [id number]  HPI 09/2017 Patient is a very pleasant 63 year old African-American female here today to establish care.  She has a very complicated past medical history.  Patient was diagnosed with colon cancer in 2010.  She underwent a hemicolectomy per her report.  There was also metastasis to the liver.  Again per her report the individual metastasis/masses were removed surgically and then she was treated with chemotherapy.  She was reportedly told that she was cancer free after chemotherapy starting in 2013.  She was followed up annually by her oncologist but had not seen him in the last year.  She also states that it has been more than 2 years since her colonoscopy.  Past medical history is also significant for a left knee replacement that due to complications was repeated 1 year ago.  She continues to have pain in her left knee particularly over the medial joint line.  She is due for Pneumovax 23.  She is due for a flu shot.  She is due for the shingles vaccine.  She has not had a mammogram in more than 5 years.  She also had a total abdominal hysterectomy/bilateral salpingo-oophorectomy when she was 63 years old due to fibroids.  She states that both of her ovaries were removed at that time.  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  She has not had a bone density test.  She is not taking any calcium or vitamin D supplement.  She is overdue for a tetanus vaccine.  She is overdue for hepatitis C screening.  She has not had an HIV screening test.  AT that time, my plan was: Patient appears to be overdue for a colonoscopy having been 2 to 3 years since her last colonoscopy.  I think it would be wise to arrange follow-up with a local gastroenterologist given that her current gastroenterologist is 5 hours away.  She also needs a local oncologist.  I will order a CEA to monitor for any  evidence of recurrence.  I will defer to the oncologist opinion if CT scan of the chest abdomen and pelvis is required now 5 to 6 years out.  I will screen the patient for HIV as well as hepatitis C.  I will check a CBC, CMP, fasting lipid panel.  I recommended Pneumovax 23 as well as a flu shot which she deferred.  Also recommended the shingles vaccine.  She declined a tetanus shot.  I will obtain an x-ray of the left knee given the pain that she is having.  I erroneously ordered a Ca125 and I will cancel this test.  Given her premature menopause I will also order a bone density test.  The patient is overdue for mammogram.  I will schedule this as well.  Given her hysterectomy, she does not require Pap smear   02/12/18 Patient has yet to see the oncologist or gastroenterologist due to not wanting to miss work.  She presents today with 2 to 3 weeks of sharp pain in her left lower leg.  The pain is located over the anterior surface of the tibia.  The pain is in the mid shaft.  It is exquisitely tender to palpation.  The bone itself is tender to palpation.  There is no bruising.  There is no erythema.  There is no warmth.  She denies any falls.  She  denies any injuries.  However this is the leg that she has had 2 knee replacements performed on and has chronic knee pain.  She is having some mild pain in her knee however this pain is located much lower over the tibia itself.  She was concerned about a blood clot.  She does have trace bipedal edema it is symmetric bilaterally.  There is no erythema or warmth and she has a negative Homans sign.  I am concerned that this is bone pain in a patient with a previous history of cancer.  Differential diagnosis includes stress reaction, stress fracture, possible skeletal lesion.  She also has pain over the lateral epicondyle of her left elbow.  She is tender to palpation over the lateral epicondyle and is made worse by gripping and dorsiflexing the wrist.  This is an overuse  injury.  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 Past Medical History:  Diagnosis Date  . Cancer [facility])    2010 Colon with mets to the liver, partial colectomy, chemo, and liver met resection  . Cataract     Past Surgical History:  Procedure Laterality Date  . ABDOMINAL HYSTERECTOMY     due to fibroids at 35, TAH BSO  . COLON SURGERY    . JOINT REPLACEMENT     TKR x 2 on left knee  . LIVER SURGERY     No current outpatient medications on file prior to visit.   No current facility-administered medications on file prior to visit.    No Known Allergies Social History   Socioeconomic History  . Marital status: Single    Spouse name: Not on file  . Number of children: Not on file  . Years of education: Not on file  . Highest education level: Not on file  Occupational History  . Not on file  Social Needs  . Financial resource strain: Not on file  . Food insecurity:    Worry: Not on file    Inability: Not on file  . Transportation needs:    Medical: Not on file    Non-medical: Not on file  Tobacco Use  . Smoking status: Former Smoker    Types: Cigarettes  . Smokeless tobacco: Former Systems developer    Quit date: 05/19/1985  Substance and Sexual Activity  . Alcohol use: Yes    Alcohol/week: 0.0 standard drinks    Comment: beer every other day  . Drug use: No  . Sexual activity: Not on file  Lifestyle  . Physical activity:    Days per week: Not on file    Minutes per session: Not on file  . Stress: Not on file  Relationships  . Social connections:    Talks on phone: Not on file    Gets together: Not on file    Attends religious service: Not on file    Active member of club or organization: Not on file    Attends meetings of clubs or organizations: Not on file    Relationship status: Not on file  . Intimate partner violence:    Fear of current or ex partner: Not on file    Emotionally abused: Not on file    Physically abused: Not  on file    Forced sexual activity: Not on file  Other Topics Concern  . Not on file  Social History Narrative  . Not on file   No family history on file.  Review of Systems  All other systems reviewed and are negative.      Objective:   Physical Exam Vitals signs reviewed.  Constitutional:      General: She is not in acute distress.    Appearance: She is well-developed. She is not diaphoretic.  HENT:     Head: Normocephalic and atraumatic.     Mouth/Throat:     Pharynx: No oropharyngeal exudate.  Neck:     Thyroid: No thyromegaly.     Vascular: No JVD.     Trachea: No tracheal deviation.  Cardiovascular:     Rate and Rhythm: Normal rate and regular rhythm.     Heart sounds: Normal heart sounds. No murmur. No friction rub. No gallop.   Pulmonary:     Effort: Pulmonary effort is normal. No respiratory distress.     Breath sounds: Normal breath sounds. No stridor. No wheezing or rales.  Chest:     Chest wall: No tenderness.  Abdominal:     General: Bowel sounds are normal.  Musculoskeletal:        General: Tenderness present. No swelling, deformity or signs of injury.     Left knee: She exhibits decreased range of motion. Tenderness found. Medial joint line and lateral joint line tenderness noted.     Left lower leg: She exhibits tenderness and bony tenderness. She exhibits no swelling, no deformity and no laceration. No edema.       Legs:  Skin:    General: Skin is warm.     Capillary Refill: Capillary refill takes less than 2 seconds.     Findings: No erythema or rash.  Neurological:     Mental Status: She is alert.     Motor: No abnormal muscle tone.           Assessment & Plan:  Pain of left lower extremity - Plan: DG Tibia/Fibula Left  I believe the patient has lateral epicondylitis in her left elbow.  I recommended an elbow strap for tennis elbow and relative rest.  I am concerned by the pain in her left tibia however.  This is not her knee.  This  is the tibial shaft itself.  Differential diagnosis includes a stress reaction versus stress fracture versus skeletal lesion.  I recommended starting with a basic x-ray of the tibia and fibula on that side.  If no abnormalities are found, I would recommend an MRI valuate for possible stress fracture given the fact the patient walks on concrete all day long and has a history of left leg pain.  This may be pain similar to shin splints however the mechanism of injury is not clear and the onset was insidious.  Therefore start with an x-ray and then proceed to MRI if pain persist and x-ray is clear

## 2018-02-15 ENCOUNTER — Other Ambulatory Visit: Payer: Self-pay | Admitting: Family Medicine

## 2018-02-15 DIAGNOSIS — M79605 Pain in left leg: Secondary | ICD-10-CM

## 2018-02-18 ENCOUNTER — Ambulatory Visit (INDEPENDENT_AMBULATORY_CARE_PROVIDER_SITE_OTHER): Payer: 59 | Admitting: Family Medicine

## 2018-02-18 ENCOUNTER — Encounter (INDEPENDENT_AMBULATORY_CARE_PROVIDER_SITE_OTHER): Payer: Self-pay | Admitting: Family Medicine

## 2018-02-18 DIAGNOSIS — M25562 Pain in left knee: Secondary | ICD-10-CM | POA: Diagnosis not present

## 2018-02-18 MED ORDER — DEXAMETHASONE SODIUM PHOSPHATE 4 MG/ML IJ SOLN: INTRAMUSCULAR | 6 refills | Status: DC

## 2018-02-18 MED ORDER — DICLOFENAC SODIUM 1 % TD GEL: 4.0000 g | Freq: Four times a day (QID) | TRANSDERMAL | 6 refills | Status: DC | PRN

## 2018-02-18 MED ORDER — NABUMETONE 500 MG PO TABS: 500.0000 mg | ORAL_TABLET | Freq: Two times a day (BID) | ORAL | 3 refills | Status: DC | PRN

## 2018-02-18 NOTE — Progress Notes (Signed)
Office Visit Note   Patient: Kathryn Zavala           Date of Birth: 03-Aug-1955           MRN: [id number] Visit Date: 02/18/2018 Requested by: Susy Frizzle, MD [address] [place], [place] [address] PCP: Susy Frizzle, MD  Subjective: Chief Complaint  Patient presents with  . Left Knee - Pain    Pain lateral knee and anterior lower leg x 2 weeks. NKI. "Feels like it's gonna break" when she walks on it. H/o TKA 04/2016 - Dr. Volney American in [place] at [facility].    HPI: She is a 63 year old with left lower leg pain.  She has a history of knee replacement in 2015.  A couple years later the femoral component shifted per patient report and she had it revised.  This was all done in [place].  She did well until a few months ago when she started having some pain in her knee again.  In the past couple weeks the pain has gotten significantly worse, different than pain she has had in the past.  He has a chronic numbness on the lateral knee since her surgery.  Her intense pain is on the lateral aspect of her knee and seems to radiate down the tibia area.  She is using over-the-counter anti-inflammatories but her symptoms are not improving.  Pain is mainly with activity, much better when she is sitting and resting.  She has had metastatic cancer.  She had x-rays of her leg recently which did not show any sign of cancer.  She now presents for evaluation.              ROS: Denies any weakness in her leg.  Other systems were negative.  Objective: Vital Signs: There were no vitals taken for this visit.  Physical Exam:  Left leg: Negative straight leg raise, lower extremity strength and reflexes are normal.  Well-healed knee replacement scar.  Exquisitely tender near the distal IT band at the knee.  Palpation here seems to reproduce her intense pain.  No tenderness around the patellofemoral joint or along the tibia.  No pain with resisted ankle strength  testing.  Imaging: Recent x-rays tip/fib: No sign of stress fracture or neoplasm.  I question whether the stem of the tibial component might be showing some loosening.  Musculoskeletal ultrasound: I briefly imaged her lateral knee but did not record the images.  The distal IT band is thickened at the level of maximum pain.  Assessment & Plan: 1.  Left lateral knee pain with tibia pain, etiology uncertain.  Could be distal IT band friction syndrome.  Cannot rule out loosening of prosthesis. -Trial of anti-inflammatories and physical therapy for iontophoresis and laser therapy.  If symptoms do not quickly improve, then possibly MRI scan of the knee.   Follow-Up Instructions: No follow-ups on file.      Procedures: No procedures performed  No notes on file    PMFS History: Patient Active Problem List   Diagnosis Date Noted  . Stroke ([place]) 10/29/2017  . Depression 10/29/2017  . Paronychia of finger of right hand 12/21/2016  . Presence of left artificial knee joint 09/04/2013  . Knee pain 11/20/2012  . Colon cancer ([place]) 07/16/2012  . Metastasis to liver ([place]) 03/09/2009   Past Medical History:  Diagnosis Date  . Cancer [facility])    2010 Colon with mets to the liver, partial colectomy, chemo, and liver  met resection  . Cataract     History reviewed. No pertinent family history.  Past Surgical History:  Procedure Laterality Date  . ABDOMINAL HYSTERECTOMY     due to fibroids at 35, TAH BSO  . COLON SURGERY    . JOINT REPLACEMENT     TKR x 2 on left knee  . LIVER SURGERY     Social History   Occupational History  . Not on file  Tobacco Use  . Smoking status: Former Smoker    Types: Cigarettes  . Smokeless tobacco: Former Systems developer    Quit date: 05/19/1985  Substance and Sexual Activity  . Alcohol use: Yes    Alcohol/week: 0.0 standard drinks    Comment: beer every other day  . Drug use: No  . Sexual activity: Not on file

## 2018-02-20 DIAGNOSIS — M79605 Pain in left leg: Secondary | ICD-10-CM | POA: Diagnosis not present

## 2018-02-25 DIAGNOSIS — M79605 Pain in left leg: Secondary | ICD-10-CM | POA: Diagnosis not present

## 2018-02-27 DIAGNOSIS — M79605 Pain in left leg: Secondary | ICD-10-CM | POA: Diagnosis not present

## 2018-03-04 DIAGNOSIS — M79605 Pain in left leg: Secondary | ICD-10-CM | POA: Diagnosis not present

## 2018-03-08 DIAGNOSIS — M79605 Pain in left leg: Secondary | ICD-10-CM | POA: Diagnosis not present

## 2018-03-18 DIAGNOSIS — M79605 Pain in left leg: Secondary | ICD-10-CM | POA: Diagnosis not present

## 2018-03-22 DIAGNOSIS — M79605 Pain in left leg: Secondary | ICD-10-CM | POA: Diagnosis not present

## 2018-03-27 DIAGNOSIS — M79605 Pain in left leg: Secondary | ICD-10-CM | POA: Diagnosis not present

## 2018-04-24 ENCOUNTER — Telehealth: Payer: Self-pay | Admitting: Family Medicine

## 2018-04-24 NOTE — Telephone Encounter (Signed)
Patient called stating that she was out of work on yesterday due to an upset stomach. She denies fever, vomiting, and diarrhea. She tried to return to work on today and was told she must have a Doctor's note. She would like to know if we can just write her a note to return to work on tomorrow. Her symptoms have resolved. Please advise?

## 2018-04-25 ENCOUNTER — Encounter: Payer: Self-pay | Admitting: Family Medicine

## 2018-04-25 NOTE — Telephone Encounter (Signed)
Spoke with patient and informed her that her work excuse is ready for pick up. Patient verbalized understanding.

## 2018-04-25 NOTE — Telephone Encounter (Signed)
She can have a doctor's note.

## 2018-06-11 ENCOUNTER — Other Ambulatory Visit: Payer: Self-pay

## 2018-06-11 ENCOUNTER — Ambulatory Visit (INDEPENDENT_AMBULATORY_CARE_PROVIDER_SITE_OTHER): Payer: 59 | Admitting: Family Medicine

## 2018-06-11 ENCOUNTER — Encounter: Payer: Self-pay | Admitting: Family Medicine

## 2018-06-11 DIAGNOSIS — A084 Viral intestinal infection, unspecified: Secondary | ICD-10-CM

## 2018-06-11 NOTE — Progress Notes (Signed)
Subjective:    Patient ID: Kathryn Zavala, female    DOB: 1955/03/04, 63 y.o.   MRN: [id number]  HPI Patient is being seen as a telephone visit today.  She consents to be seen over the telephone.  Patient is currently at home.  I am currently in my office.  Phone call began at 28.  Phone call ended at 221.  Patient states that last Wednesday, 6 days ago, she developed an upset stomach characterized by diarrhea as well as some mild lower back pain.  She had a fever to 101.6.  She denies any cough or dyspnea.  She denies any rhinorrhea or sore throat.  She denies any vomiting but did have nausea.  Fever subsided after 24 hours.  She has been afebrile now for 5 days.  She continues to have some mild diarrhea although it is improving.  She has 2-3 episodes of loose stool per day.  The diarrhea does improve with Imodium.  She denies any cough.  She denies any shortness of breath.  She denies any fever in the last 5 days.  She denies any chest pain.  She denies any sore throat.  She denies any upper respiratory symptoms.  The low back pain is located in the midline roughly at the level of L5 with no radicular symptoms.  It is mild.  She denies any dysuria or urgency or frequency or hematuria. Past Medical History:  Diagnosis Date  . Cancer [facility])    2010 Colon with mets to the liver, partial colectomy, chemo, and liver met resection  . Cataract    Past Surgical History:  Procedure Laterality Date  . ABDOMINAL HYSTERECTOMY     due to fibroids at 35, TAH BSO  . COLON SURGERY    . JOINT REPLACEMENT     TKR x 2 on left knee  . LIVER SURGERY     Current Outpatient Medications on File Prior to Visit  Medication Sig Dispense Refill  . dexamethasone (DECADRON) 4 MG/ML injection Apply as needed for iontophoresis 30 mL 6  . diclofenac sodium (VOLTAREN) 1 % GEL Apply 4 g topically 4 (four) times daily as needed. 500 g 6  . nabumetone (RELAFEN) 500 MG tablet Take 1 tablet (500 mg total) by mouth 2  (two) times daily as needed. 60 tablet 3  . naproxen sodium (ALEVE) 220 MG tablet Take 220 mg by mouth.     No current facility-administered medications on file prior to visit.    No Known Allergies Social History   Socioeconomic History  . Marital status: Single    Spouse name: Not on file  . Number of children: Not on file  . Years of education: Not on file  . Highest education level: Not on file  Occupational History  . Not on file  Social Needs  . Financial resource strain: Not on file  . Food insecurity:    Worry: Not on file    Inability: Not on file  . Transportation needs:    Medical: Not on file    Non-medical: Not on file  Tobacco Use  . Smoking status: Former Smoker    Types: Cigarettes  . Smokeless tobacco: Former Systems developer    Quit date: 05/19/1985  Substance and Sexual Activity  . Alcohol use: Yes    Alcohol/week: 0.0 standard drinks    Comment: beer every other day  . Drug use: No  . Sexual activity: Not on file  Lifestyle  . Physical activity:  Days per week: Not on file    Minutes per session: Not on file  . Stress: Not on file  Relationships  . Social connections:    Talks on phone: Not on file    Gets together: Not on file    Attends religious service: Not on file    Active member of club or organization: Not on file    Attends meetings of clubs or organizations: Not on file    Relationship status: Not on file  . Intimate partner violence:    Fear of current or ex partner: Not on file    Emotionally abused: Not on file    Physically abused: Not on file    Forced sexual activity: Not on file  Other Topics Concern  . Not on file  Social History Narrative  . Not on file      Review of Systems  All other systems reviewed and are negative.      Objective:   Physical Exam   Physical exam could not be performed today as the patient was seen as a telephone visit however she is mentating well over the telephone and appears to be in no  distress talking full and complete sentences with no respiratory distress    Assessment & Plan:  Viral gastroenteritis  Patient symptoms sound consistent with viral gastroenteritis.  I do not believe that the patient had COVID-19.  She has never had any respiratory symptoms.  Tomorrow, the patient have been afebrile for 1 week.  I suspect that her symptoms should gradually improve over the next 2 to 3 days.  I have recommended Imodium and a brat diet for the next 2 to 3 days to help manage her symptoms.  I feel the patient should be ready to return to work on Monday.  I would recommend that she remain out of work until Monday out of an abundance of caution given the current coronavirus pandemic.  If she has not had any additional fever or cough by Monday, I feel that she is free to return to work

## 2018-06-13 DIAGNOSIS — Z20828 Contact with and (suspected) exposure to other viral communicable diseases: Secondary | ICD-10-CM | POA: Diagnosis not present

## 2018-08-29 ENCOUNTER — Encounter: Payer: Self-pay | Admitting: Family Medicine

## 2018-08-29 ENCOUNTER — Ambulatory Visit (INDEPENDENT_AMBULATORY_CARE_PROVIDER_SITE_OTHER): Payer: 59 | Admitting: Family Medicine

## 2018-08-29 ENCOUNTER — Other Ambulatory Visit: Payer: Self-pay

## 2018-08-29 VITALS — BP 100/72 | HR 60 | Temp 98.3°F | Resp 16 | Ht 69.0 in | Wt 188.0 lb

## 2018-08-29 DIAGNOSIS — R55 Syncope and collapse: Secondary | ICD-10-CM | POA: Diagnosis not present

## 2018-08-29 DIAGNOSIS — M79605 Pain in left leg: Secondary | ICD-10-CM

## 2018-08-29 NOTE — Progress Notes (Signed)
Subjective:    Patient ID: Kathryn Zavala, female    DOB: 03-Jan-1956, 63 y.o.   MRN: [id number]  HPI 09/2017 Patient is a very pleasant 63 year old African-American female here today to establish care.  She has a very complicated past medical history.  Patient was diagnosed with colon cancer in 2010.  She underwent a hemicolectomy per her report.  There was also metastasis to the liver.  Again per her report the individual metastasis/masses were removed surgically and then she was treated with chemotherapy.  She was reportedly told that she was cancer free after chemotherapy starting in 2013.  She was followed up annually by her oncologist but had not seen him in the last year.  She also states that it has been more than 2 years since her colonoscopy.  Past medical history is also significant for a left knee replacement that due to complications was repeated 1 year ago.  She continues to have pain in her left knee particularly over the medial joint line.  She is due for Pneumovax 23.  She is due for a flu shot.  She is due for the shingles vaccine.  She has not had a mammogram in more than 5 years.  She also had a total abdominal hysterectomy/bilateral salpingo-oophorectomy when she was 63 years old due to fibroids.  She states that both of her ovaries were removed at that time.  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  She has not had a bone density test.  She is not taking any calcium or vitamin D supplement.  She is overdue for a tetanus vaccine.  She is overdue for hepatitis C screening.  She has not had an HIV screening test.  AT that time, my plan was: Patient appears to be overdue for a colonoscopy having been 2 to 3 years since her last colonoscopy.  I think it would be wise to arrange follow-up with a local gastroenterologist given that her current gastroenterologist is 5 hours away.  She also needs a local oncologist.  I will order a CEA to monitor for any  evidence of recurrence.  I will defer to the oncologist opinion if CT scan of the chest abdomen and pelvis is required now 5 to 6 years out.  I will screen the patient for HIV as well as hepatitis C.  I will check a CBC, CMP, fasting lipid panel.  I recommended Pneumovax 23 as well as a flu shot which she deferred.  Also recommended the shingles vaccine.  She declined a tetanus shot.  I will obtain an x-ray of the left knee given the pain that she is having.  I erroneously ordered a Ca125 and I will cancel this test.  Given her premature menopause I will also order a bone density test.  The patient is overdue for mammogram.  I will schedule this as well.  Given her hysterectomy, she does not require Pap smear   02/12/18 Patient has yet to see the oncologist or gastroenterologist due to not wanting to miss work.  She presents today with 2 to 3 weeks of sharp pain in her left lower leg.  The pain is located over the anterior surface of the tibia.  The pain is in the mid shaft.  It is exquisitely tender to palpation.  The bone itself is tender to palpation.  There is no bruising.  There is no erythema.  There is no warmth.  She denies any falls.  She  denies any injuries.  However this is the leg that she has had 2 knee replacements performed on and has chronic knee pain.  She is having some mild pain in her knee however this pain is located much lower over the tibia itself.  She was concerned about a blood clot.  She does have trace bipedal edema it is symmetric bilaterally.  There is no erythema or warmth and she has a negative Homans sign.  I am concerned that this is bone pain in a patient with a previous history of cancer.  Differential diagnosis includes stress reaction, stress fracture, possible skeletal lesion.  She also has pain over the lateral epicondyle of her left elbow.  She is tender to palpation over the lateral epicondyle and is made worse by gripping and dorsiflexing the wrist.  This is an overuse  injury.  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  At that time, my plan was: I believe the patient has lateral epicondylitis in her left elbow.  I recommended an elbow strap for tennis elbow and relative rest.  I am concerned by the pain in her left tibia however.  This is not her knee.  This is the tibial shaft itself.  Differential diagnosis includes a stress reaction versus stress fracture versus skeletal lesion.  I recommended starting with a basic x-ray of the tibia and fibula on that side.  If no abnormalities are found, I would recommend an MRI valuate for possible stress fracture given the fact the patient walks on concrete all day long and has a history of left leg pain.  This may be pain similar to shin splints however the mechanism of injury is not clear and the onset was insidious.  Therefore start with an x-ray and then proceed to MRI if pain persist and x-ray is clear  08/29/18 X-ray of the left tibia revealed no fractures or abnormalities.  Patient states that she has had near daily constant pain in the left lower tibia since that time.  She is tender to palpation over the distal left tibia.  In fact she jumps when I squeeze the distal end of the tibia just above the ankle.  She reports worsening pain with prolonged standing and walking however it even aches and throbs when she is simply just sitting around.  She also had an episode last night at work where she had to go home early.  Patient works in a [employer], dispensing.  She got to work and after approximately 1 hour she began to feel hot.  She started to feel lightheaded and nauseated.  She started to feel like she was going to pass out.  She had to sit down.  She became extremely lightheaded.  As soon as she started to lie down and rest she started to feel better but she went home.  She denies any chest pain shortness of breath or dyspnea on exertion.  She denies any previous history of syncope or near  syncope or palpitations. Past Medical History:  Diagnosis Date  . Cancer [facility])    2010 Colon with mets to the liver, partial colectomy, chemo, and liver met resection  . Cataract     Past Surgical History:  Procedure Laterality Date  . ABDOMINAL HYSTERECTOMY     due to fibroids at 35, [facility] BSO  . COLON SURGERY    . JOINT REPLACEMENT     TKR x 2 on left knee  .  LIVER SURGERY     No current outpatient medications on file prior to visit.   No current facility-administered medications on file prior to visit.    No Known Allergies Social History   Socioeconomic History  . Marital status: Single    Spouse name: Not on file  . Number of children: Not on file  . Years of education: Not on file  . Highest education level: Not on file  Occupational History  . Not on file  Social Needs  . Financial resource strain: Not on file  . Food insecurity    Worry: Not on file    Inability: Not on file  . Transportation needs    Medical: Not on file    Non-medical: Not on file  Tobacco Use  . Smoking status: Former Smoker    Types: Cigarettes  . Smokeless tobacco: Former Systems developer    Quit date: 05/19/1985  Substance and Sexual Activity  . Alcohol use: Yes    Alcohol/week: 0.0 standard drinks    Comment: beer every other day  . Drug use: No  . Sexual activity: Not on file  Lifestyle  . Physical activity    Days per week: Not on file    Minutes per session: Not on file  . Stress: Not on file  Relationships  . Social Herbalist on phone: Not on file    Gets together: Not on file    Attends religious service: Not on file    Active member of club or organization: Not on file    Attends meetings of clubs or organizations: Not on file    Relationship status: Not on file  . Intimate partner violence    Fear of current or ex partner: Not on file    Emotionally abused: Not on file    Physically abused: Not on file    Forced sexual activity: Not on file  Other Topics Concern   . Not on file  Social History Narrative  . Not on file   No family history on file.    Review of Systems  All other systems reviewed and are negative.      Objective:   Physical Exam Vitals signs reviewed.  Constitutional:      General: She is not in acute distress.    Appearance: She is well-developed. She is not diaphoretic.  HENT:     Head: Normocephalic and atraumatic.     Mouth/Throat:     Pharynx: No oropharyngeal exudate.  Neck:     Thyroid: No thyromegaly.     Vascular: No JVD.     Trachea: No tracheal deviation.  Cardiovascular:     Rate and Rhythm: Normal rate and regular rhythm.     Heart sounds: Normal heart sounds. No murmur. No friction rub. No gallop.   Pulmonary:     Effort: Pulmonary effort is normal. No respiratory distress.     Breath sounds: Normal breath sounds. No stridor. No wheezing or rales.  Chest:     Chest wall: No tenderness.  Abdominal:     General: Bowel sounds are normal.  Musculoskeletal:        General: Tenderness present. No swelling, deformity or signs of injury.     Left knee: She exhibits decreased range of motion. Tenderness found. Medial joint line and lateral joint line tenderness noted.     Left lower leg: She exhibits tenderness and bony tenderness. She exhibits no swelling, no deformity and no laceration. No  edema.       Legs:  Skin:    General: Skin is warm.     Capillary Refill: Capillary refill takes less than 2 seconds.     Findings: No erythema or rash.  Neurological:     Mental Status: She is alert.     Motor: No abnormal muscle tone.           Assessment & Plan:  1. Vasovagal episode I believe the patient likely had heat exhaustion which triggered a vasovagal episode.  Her exam today is completely normal.  If her lab work is normal, I feel no further work-up is necessary for this.  I did encourage the patient to try to remain as cool as possible and to push fluids to help prevent this in the future. - CBC  with Differential/Platelet - COMPLETE METABOLIC PANEL WITH GFR  2. Leg pain, anterior, left I suspect the patient is having shin splints and may have a stress fracture in her left tibia.  Initial x-rays were negative therefore I will proceed with an MRI.  If insurance would not cover the MRI will refer the patient to orthopedics.

## 2018-08-30 LAB — CBC WITH DIFFERENTIAL/PLATELET
Absolute Monocytes: 450 cells/uL (ref 200–950)
Basophils Absolute: 42 cells/uL (ref 0–200)
Basophils Relative: 0.7 %
Eosinophils Absolute: 108 cells/uL (ref 15–500)
Eosinophils Relative: 1.8 %
HCT: 40.5 % (ref 35.0–45.0)
Hemoglobin: 13.5 g/dL (ref 11.7–15.5)
Lymphs Abs: 2028 cells/uL (ref 850–3900)
MCH: 31 pg (ref 27.0–33.0)
MCHC: 33.3 g/dL (ref 32.0–36.0)
MCV: 93.1 fL (ref 80.0–100.0)
MPV: 10.9 fL (ref 7.5–12.5)
Monocytes Relative: 7.5 %
Neutro Abs: 3372 cells/uL (ref 1500–7800)
Neutrophils Relative %: 56.2 %
Platelets: 206 10*3/uL (ref 140–400)
RBC: 4.35 10*6/uL (ref 3.80–5.10)
RDW: 12.3 % (ref 11.0–15.0)
Total Lymphocyte: 33.8 %
WBC: 6 10*3/uL (ref 3.8–10.8)

## 2018-08-30 LAB — COMPLETE METABOLIC PANEL WITH GFR
AG Ratio: 1.5 (calc) (ref 1.0–2.5)
ALT: 11 U/L (ref 6–29)
AST: 15 U/L (ref 10–35)
Albumin: 4 g/dL (ref 3.6–5.1)
Alkaline phosphatase (APISO): 75 U/L (ref 37–153)
BUN: 19 mg/dL (ref 7–25)
CO2: 25 mmol/L (ref 20–32)
Calcium: 9 mg/dL (ref 8.6–10.4)
Chloride: 108 mmol/L (ref 98–110)
Creat: 0.78 mg/dL (ref 0.50–0.99)
GFR, Est African American: 93 mL/min/{1.73_m2} (ref >=60)
GFR, Est Non African American: 80 mL/min/{1.73_m2} (ref >=60)
Globulin: 2.7 g/dL (calc) (ref 1.9–3.7)
Glucose, Bld: 81 mg/dL (ref 65–99)
Potassium: 4.6 mmol/L (ref 3.5–5.3)
Sodium: 141 mmol/L (ref 135–146)
Total Bilirubin: 0.5 mg/dL (ref 0.2–1.2)
Total Protein: 6.7 g/dL (ref 6.1–8.1)

## 2018-09-02 ENCOUNTER — Other Ambulatory Visit: Payer: Self-pay | Admitting: Family Medicine

## 2018-09-02 DIAGNOSIS — M79605 Pain in left leg: Secondary | ICD-10-CM

## 2018-09-02 NOTE — Progress Notes (Signed)
Mr ti

## 2018-09-27 ENCOUNTER — Encounter: Payer: Self-pay | Admitting: Family Medicine

## 2018-10-05 ENCOUNTER — Emergency Department (HOSPITAL_COMMUNITY): Payer: No Typology Code available for payment source

## 2018-10-05 ENCOUNTER — Encounter (HOSPITAL_COMMUNITY): Payer: Self-pay | Admitting: Emergency Medicine

## 2018-10-05 ENCOUNTER — Other Ambulatory Visit: Payer: Self-pay

## 2018-10-05 ENCOUNTER — Emergency Department (HOSPITAL_COMMUNITY)
Admission: EM | Admit: 2018-10-05 | Discharge: 2018-10-05 | Disposition: A | Discharge status: Home / Self Care | Payer: No Typology Code available for payment source | Attending: Emergency Medicine | Admitting: Emergency Medicine

## 2018-10-05 DIAGNOSIS — Z85038 Personal history of other malignant neoplasm of large intestine: Secondary | ICD-10-CM | POA: Insufficient documentation

## 2018-10-05 DIAGNOSIS — M546 Pain in thoracic spine: Secondary | ICD-10-CM | POA: Diagnosis not present

## 2018-10-05 DIAGNOSIS — M549 Dorsalgia, unspecified: Secondary | ICD-10-CM

## 2018-10-05 DIAGNOSIS — Z96652 Presence of left artificial knee joint: Secondary | ICD-10-CM | POA: Insufficient documentation

## 2018-10-05 DIAGNOSIS — C787 Secondary malignant neoplasm of liver and intrahepatic bile duct: Secondary | ICD-10-CM | POA: Diagnosis not present

## 2018-10-05 DIAGNOSIS — M25562 Pain in left knee: Secondary | ICD-10-CM | POA: Insufficient documentation

## 2018-10-05 DIAGNOSIS — Z87891 Personal history of nicotine dependence: Secondary | ICD-10-CM | POA: Diagnosis not present

## 2018-10-05 DIAGNOSIS — Y9389 Activity, other specified: Secondary | ICD-10-CM | POA: Diagnosis not present

## 2018-10-05 DIAGNOSIS — M79632 Pain in left forearm: Secondary | ICD-10-CM | POA: Diagnosis not present

## 2018-10-05 DIAGNOSIS — M542 Cervicalgia: Secondary | ICD-10-CM | POA: Diagnosis not present

## 2018-10-05 DIAGNOSIS — Y999 Unspecified external cause status: Secondary | ICD-10-CM | POA: Insufficient documentation

## 2018-10-05 DIAGNOSIS — R51 Headache: Secondary | ICD-10-CM | POA: Diagnosis not present

## 2018-10-05 DIAGNOSIS — M79662 Pain in left lower leg: Secondary | ICD-10-CM | POA: Diagnosis not present

## 2018-10-05 DIAGNOSIS — Y92411 Interstate highway as the place of occurrence of the external cause: Secondary | ICD-10-CM | POA: Insufficient documentation

## 2018-10-05 DIAGNOSIS — Z8673 Personal history of transient ischemic attack (TIA), and cerebral infarction without residual deficits: Secondary | ICD-10-CM | POA: Diagnosis not present

## 2018-10-05 DIAGNOSIS — S8992XA Unspecified injury of left lower leg, initial encounter: Secondary | ICD-10-CM | POA: Diagnosis not present

## 2018-10-05 DIAGNOSIS — S299XXA Unspecified injury of thorax, initial encounter: Secondary | ICD-10-CM | POA: Diagnosis not present
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
## 2018-10-05 MED ORDER — OXYCODONE HCL 5 MG PO TABS
5.0000 mg | ORAL_TABLET | Freq: Once | ORAL | Status: AC
  Administered 2018-10-05: 5 mg via ORAL
  Filled 2018-10-05: qty 1

## 2018-10-05 MED ORDER — ACETAMINOPHEN 500 MG PO TABS
1000.0000 mg | ORAL_TABLET | Freq: Once | ORAL | Status: AC
  Administered 2018-10-05: 1000 mg via ORAL
  Filled 2018-10-05: qty 2
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
## 2018-10-05 NOTE — Discharge Instructions (Signed)
You will hurt worse tomorrow this is normal.  Please return for worsening confusion one-sided weakness or uncontrollable vomiting.  Take Tylenol and ibuprofen for pain.  Take 4 over the counter ibuprofen tablets 3 times a day or 2 over-the-counter naproxen tablets twice a day for pain. Also take tylenol 1000mg (2 extra strength) four times a day.

## 2018-10-29 ENCOUNTER — Other Ambulatory Visit: Payer: Self-pay

## 2018-10-29 ENCOUNTER — Ambulatory Visit
Admission: RE | Admit: 2018-10-29 | Discharge: 2018-10-29 | Disposition: A | Discharge status: Home / Self Care | Payer: PRIVATE HEALTH INSURANCE | Source: Ambulatory Visit | Attending: Family Medicine | Admitting: Family Medicine

## 2018-10-29 DIAGNOSIS — M7989 Other specified soft tissue disorders: Secondary | ICD-10-CM | POA: Diagnosis not present

## 2018-10-29 DIAGNOSIS — M79605 Pain in left leg: Secondary | ICD-10-CM | POA: Diagnosis not present

## 2018-10-29 IMAGING — MR MR [PERSON_NAME] LOW W/O CM*L*
5 series · 34 of 40 positions shown · non-contrast
Comparison: None.

CLINICAL DATA: Left leg pain and swelling. Symptoms for the past
year.

EXAM:
MRI OF LOWER LEFT EXTREMITY WITHOUT CONTRAST
TECHNIQUE: Multiplanar, multisequence MR imaging of the left lower leg was
performed. No intravenous contrast was administered.

[Series 5: T1 · coronal · 4.0mm · 1.11mm/px · 4 of 30 slices shown (1 of 2)]
[im 1/30]
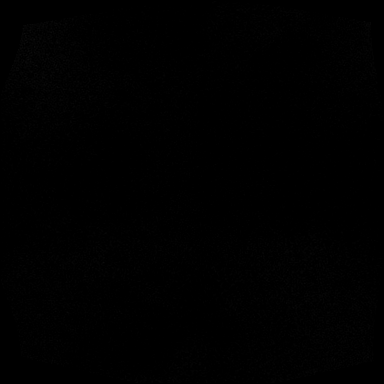
[im 10/30]
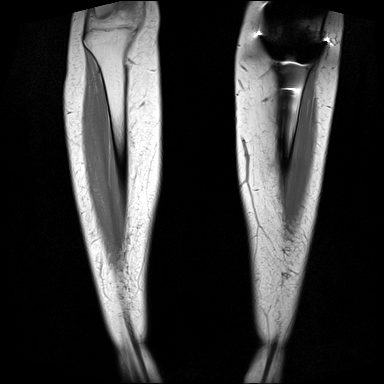
[im 20/30]
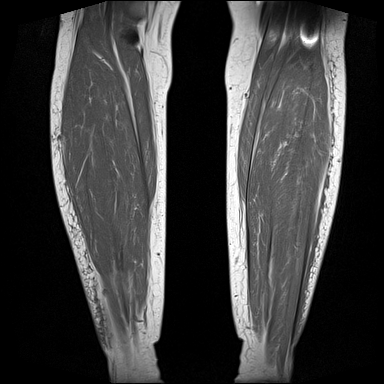
[im 30/30]
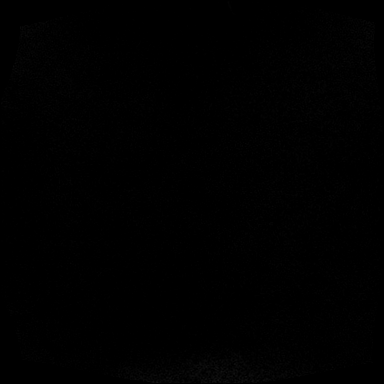

[Series 6: STIR · coronal · 4.0mm · 1.66mm/px · 5 of 30 slices shown (1 of 2)]
[im 1/30]
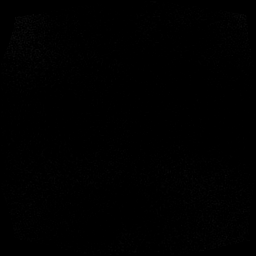
[im 8/30]
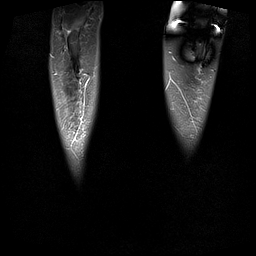
[im 15/30]
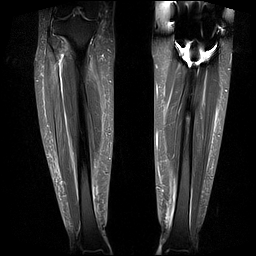
[im 22/30]
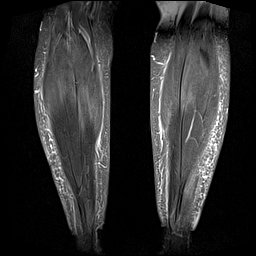
[im 30/30]
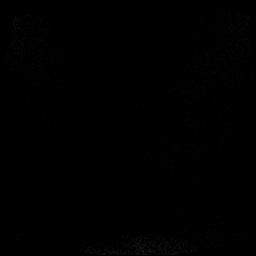

[Series 7: T1 · axial · 5.0mm · 0.35mm/px · z∈[-274,+155]mm · 9 of 70 slices shown (2 of 2)]
[im 1/70]
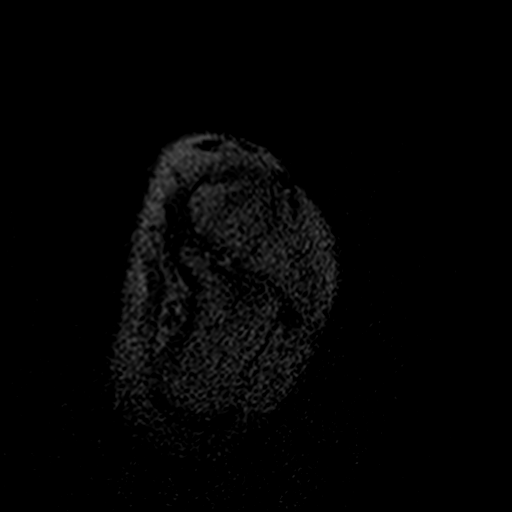
[im 13/70]
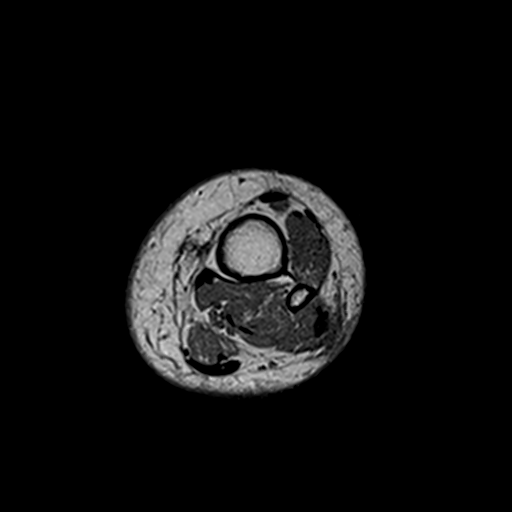
[im 19/70]
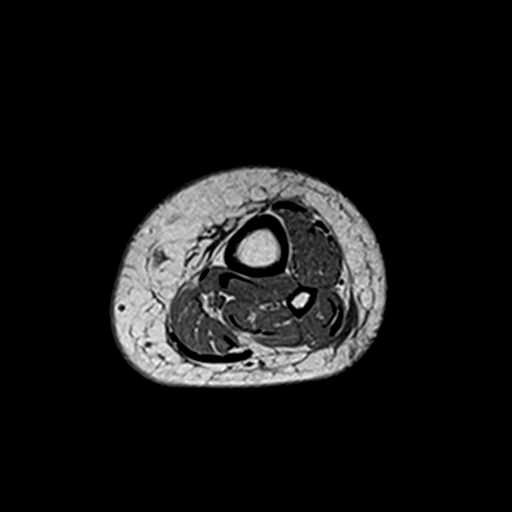
[im 32/70]
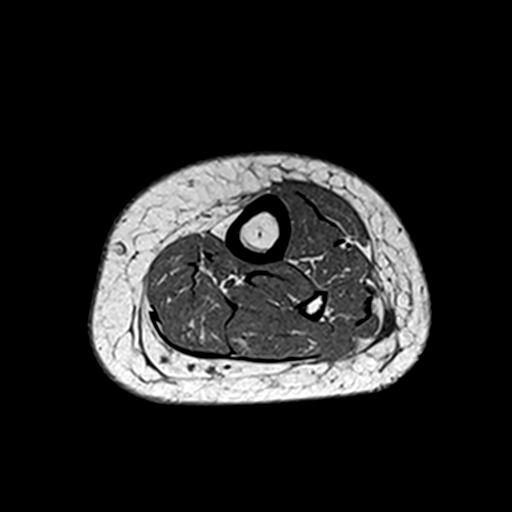
[im 38/70]
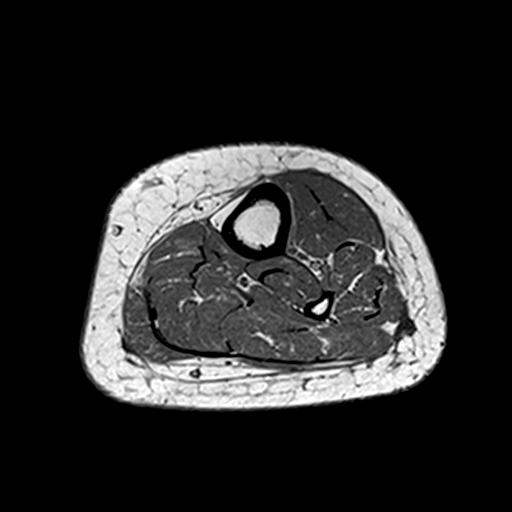
[im 51/70]
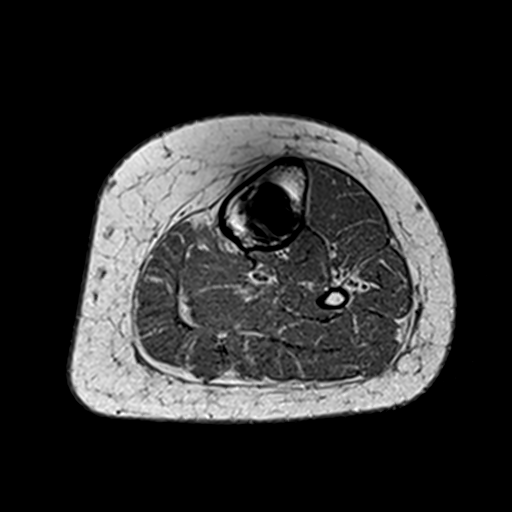
[im 57/70]
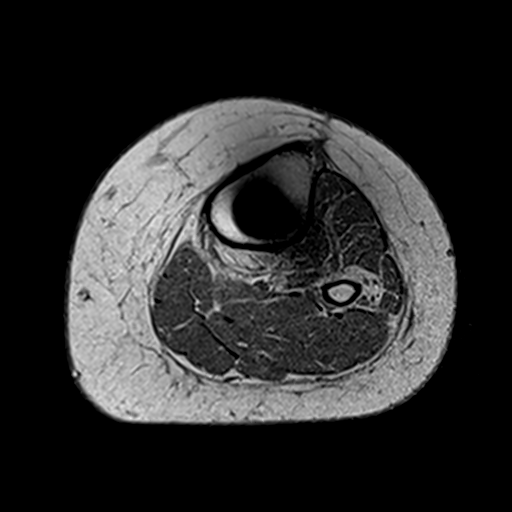
[im 63/70]
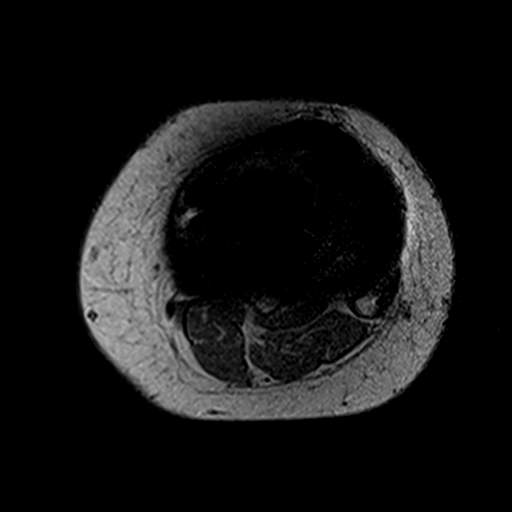
[im 70/70]
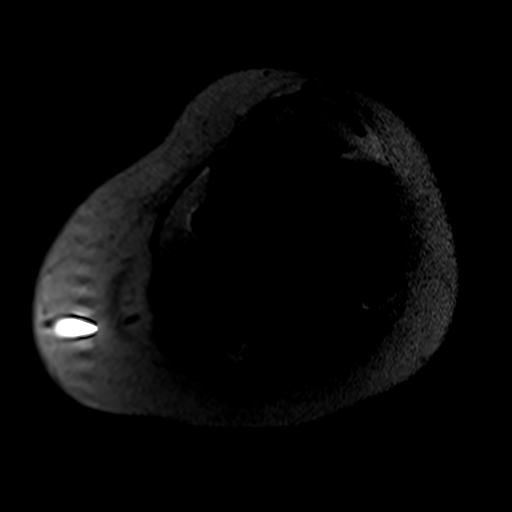

[Series 8: T2 fat-sat · axial · 5.0mm · 0.47mm/px · z∈[-274,+155]mm · 9 of 70 slices shown]
[im 1/70]
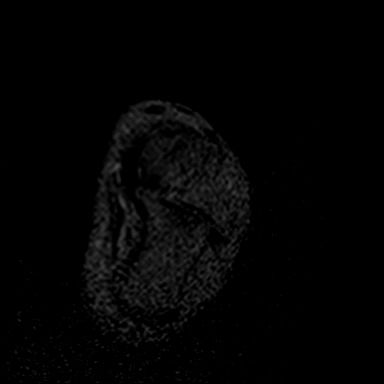
[im 13/70]
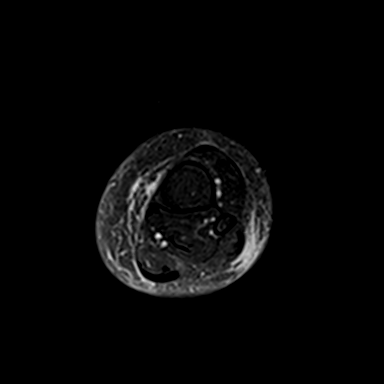
[im 19/70]
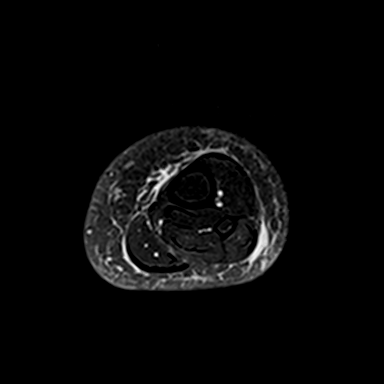
[im 32/70]
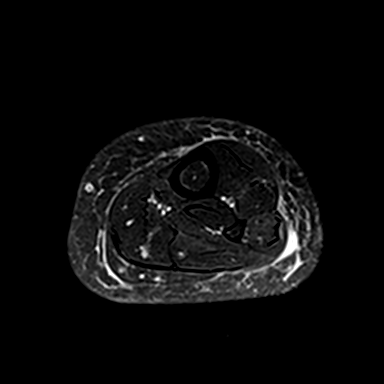
[im 38/70]
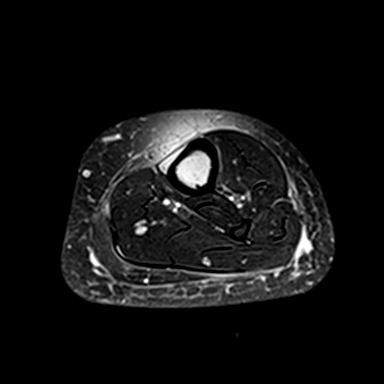
[im 51/70]
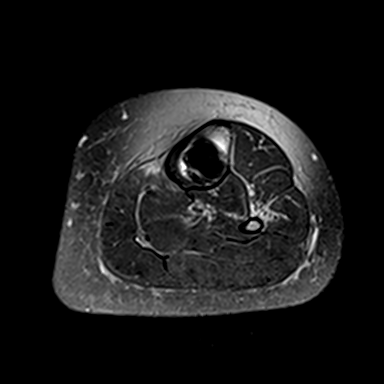
[im 57/70]
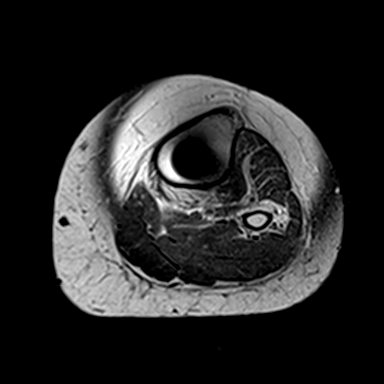
[im 63/70]
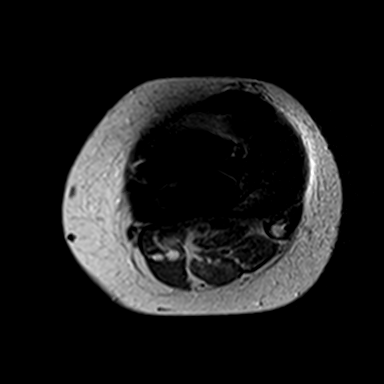
[im 70/70]
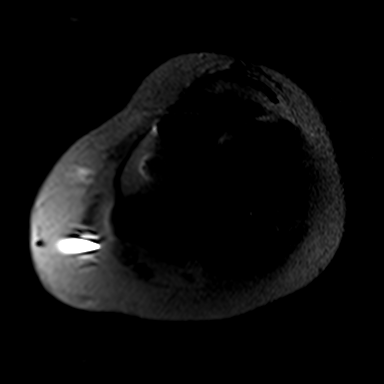

[Series 9: STIR · sagittal · 3.5mm · 1.56mm/px · 7 of 40 slices shown (2 of 2)]
[im 1/40]
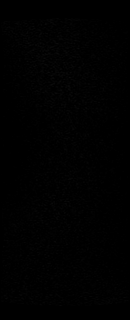
[im 7/40]
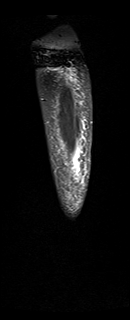
[im 14/40]
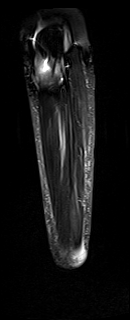
[im 20/40]
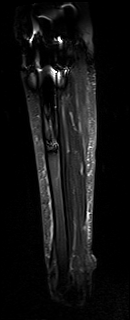
[im 27/40]
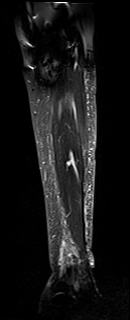
[im 33/40]
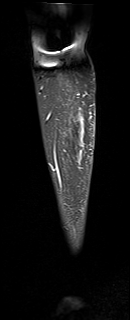
[im 40/40]
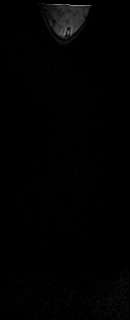

[34 of 40 positions shown; findings below may reference images not displayed]

FINDINGS: Bones/Joint/Cartilage

Left total knee arthroplasty with susceptibility artifact partially
obscuring the adjacent soft tissue and osseous structures. No
fracture or dislocation. Normal alignment. No joint effusion. No
periosteal reaction or bone destruction. No aggressive osseous
lesion.

Ligaments

Collateral ligaments are intact.

Muscles and Tendons
Flexor, peroneal and extensor compartment tendons are intact.
Muscles are normal.

Soft tissue
No fluid collection or hematoma.  No soft tissue mass.
IMPRESSION: 1. No acute injury of the left lower leg.
2. Left total knee arthroplasty with susceptibility artifact
partially obscuring the adjacent soft tissue and osseous structures.
No hardware failure or complication.

## 2018-11-01 ENCOUNTER — Other Ambulatory Visit: Payer: Self-pay | Admitting: *Deleted

## 2018-11-01 DIAGNOSIS — M79605 Pain in left leg: Secondary | ICD-10-CM

## 2018-11-08 ENCOUNTER — Ambulatory Visit: Payer: Self-pay

## 2018-11-08 ENCOUNTER — Encounter: Payer: Self-pay | Admitting: Family Medicine

## 2018-11-08 ENCOUNTER — Other Ambulatory Visit: Payer: Self-pay

## 2018-11-08 ENCOUNTER — Ambulatory Visit (INDEPENDENT_AMBULATORY_CARE_PROVIDER_SITE_OTHER): Payer: Medicare Other | Admitting: Family Medicine

## 2018-11-08 DIAGNOSIS — R208 Other disturbances of skin sensation: Secondary | ICD-10-CM

## 2018-11-08 DIAGNOSIS — M79605 Pain in left leg: Secondary | ICD-10-CM | POA: Diagnosis not present

## 2018-11-08 DIAGNOSIS — R200 Anesthesia of skin: Secondary | ICD-10-CM

## 2018-11-08 DIAGNOSIS — M5136 Other intervertebral disc degeneration, lumbar region: Secondary | ICD-10-CM

## 2018-11-08 MED ORDER — DIAZEPAM 5 MG PO TABS: ORAL_TABLET | ORAL | 0 refills | Status: DC

## 2018-11-08 NOTE — Progress Notes (Signed)
Office Visit Note   Patient: Kathryn Zavala           Date of Birth: Jul 04, 1955           MRN: [id number] Visit Date: 11/08/2018 Requested by: Susy Frizzle, MD [address] [place],  [place] [address] PCP: Susy Frizzle, MD  Subjective: Chief Complaint  Patient presents with  . Left Lower Leg - Pain    Persistent left lower leg pain x greater than 1 year.     HPI: She is here with left lower leg pain.  Symptoms started more than a year ago.  She is status post left knee replacement several years ago, underwent revision about 2 years ago.  She has never been totally pain-free but the lower leg pain seems to be different.  It is a constant aching pain on the medial side of her shin radiating down into the foot causing numbness into the great toe.  Denies any back pain.  She had an MRI scan and x-rays of her tibia/fibula recently which were unremarkable, I viewed them today.  She is not taking any medication specifically for this pain.  She is very frustrated that all of the testing is unrevealing.  She tried physical therapy last winter and it did not help.  She did this at [facility].              ROS: Denies fevers or chills.  All other systems were reviewed and are negative.  Objective: Vital Signs: There were no vitals taken for this visit.  Physical Exam:  General:  Alert and oriented, in no acute distress. Pulm:  Breathing unlabored. Psy:  Normal mood, congruent affect. Skin: No visible rash. Left leg: Low back is nontender to palpation.  Straight leg raise is negative.  Lower extremity strength and reflexes are normal except for 4/5 weakness with EHL on the left compared to 5/5 on the right.  She is tender to palpation along the medial side of her tibia from midway down all the way to the ankle.  She has 1+ edema in both lower extremities.  Imaging: X-rays lumbar spine: Moderate to severe L5-S1 degenerative disc disease with foraminal narrowing.  Mild to  moderate L4-5 degenerative disc disease.  No sign of compression fracture or neoplasm.    Assessment & Plan: 1.  Chronic left lower leg pain with great toe numbness and EHL weakness, concerning for L5 radiculopathy. -Discussed options with her and elected to pursue lumbar MRI scan followed by possible epidural injections if indicated. -For her peripheral edema she will try compression stockings.     Procedures: No procedures performed  No notes on file     PMFS History: Patient Active Problem List   Diagnosis Date Noted  . Stroke ([place]) 10/29/2017  . Depression 10/29/2017  . Paronychia of finger of right hand 12/21/2016  . Presence of left artificial knee joint 09/04/2013  . Knee pain 11/20/2012  . Colon cancer ([place]) 07/16/2012  . Metastasis to liver ([place]) 03/09/2009   Past Medical History:  Diagnosis Date  . Cancer [facility])    2010 Colon with mets to the liver, partial colectomy, chemo, and liver met resection  . Cataract     History reviewed. No pertinent family history.  Past Surgical History:  Procedure Laterality Date  . ABDOMINAL HYSTERECTOMY     due to fibroids at 35, TAH BSO  . COLON SURGERY    . JOINT REPLACEMENT  TKR x 2 on left knee  . LIVER SURGERY     Social History   Occupational History  . Not on file  Tobacco Use  . Smoking status: Former Smoker    Types: Cigarettes  . Smokeless tobacco: Former Systems developer    Quit date: 05/19/1985  Substance and Sexual Activity  . Alcohol use: Yes    Alcohol/week: 0.0 standard drinks    Comment: beer every other day  . Drug use: No  . Sexual activity: Not on file

## 2018-11-19 ENCOUNTER — Ambulatory Visit (INDEPENDENT_AMBULATORY_CARE_PROVIDER_SITE_OTHER): Payer: Medicare Other

## 2018-11-19 ENCOUNTER — Other Ambulatory Visit: Payer: Self-pay

## 2018-11-19 DIAGNOSIS — Z23 Encounter for immunization: Secondary | ICD-10-CM

## 2018-12-14 ENCOUNTER — Ambulatory Visit (HOSPITAL_COMMUNITY)
Admission: RE | Admit: 2018-12-14 | Discharge: 2018-12-14 | Disposition: A | Discharge status: Home / Self Care | Payer: Medicare Other | Source: Ambulatory Visit | Attending: Family Medicine | Admitting: Family Medicine

## 2018-12-14 ENCOUNTER — Other Ambulatory Visit: Payer: Self-pay

## 2018-12-14 DIAGNOSIS — M79605 Pain in left leg: Secondary | ICD-10-CM | POA: Insufficient documentation

## 2018-12-14 DIAGNOSIS — M5136 Other intervertebral disc degeneration, lumbar region: Secondary | ICD-10-CM | POA: Insufficient documentation

## 2018-12-14 DIAGNOSIS — R200 Anesthesia of skin: Secondary | ICD-10-CM

## 2018-12-14 DIAGNOSIS — R208 Other disturbances of skin sensation: Secondary | ICD-10-CM

## 2018-12-14 IMAGING — MR MR LUMBAR SPINE W/O CM
4 of 5 series · 26 of 48 positions shown · non-contrast
Comparison: Lumbar spine radiographs [DATE]

CLINICAL DATA: Pain in left leg. Numbness of left foot. Other
intervertebral disc degeneration lumbar region. Back pain, greater
than 6 weeks conservative treatment, persistent symptoms. Additional
history provided: Pain in left leg and foot.

EXAM:
MRI LUMBAR SPINE WITHOUT CONTRAST
TECHNIQUE: Multiplanar, multisequence MR imaging of the lumbar spine was
performed. No intravenous contrast was administered.

[Series 5: T2 · sagittal · 4.0mm · 0.73mm/px · 6 of 15 slices shown (1 of 2)]
[im 1/15]
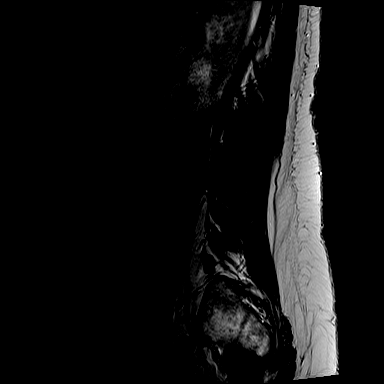
[im 3/15]
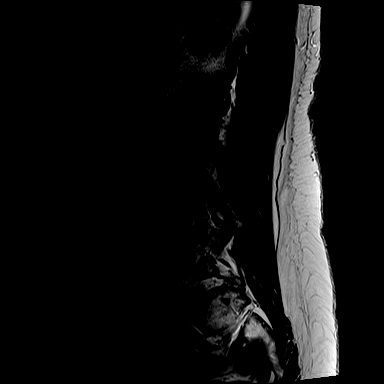
[im 6/15]
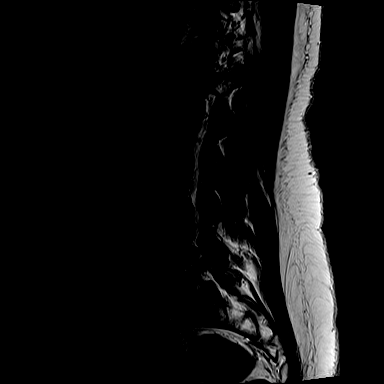
[im 9/15]
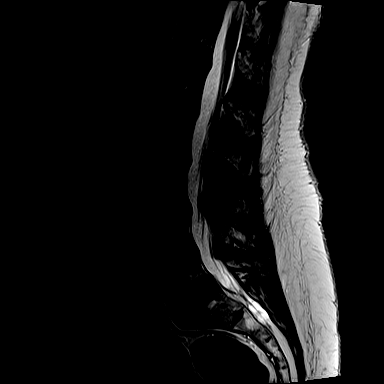
[im 12/15]
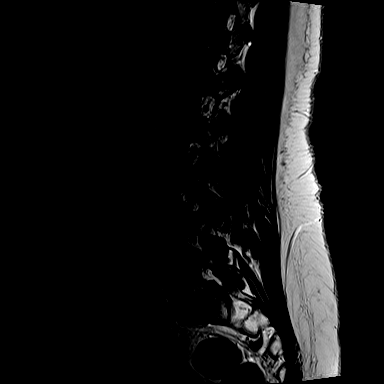
[im 15/15]
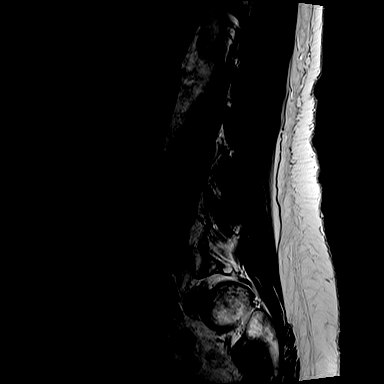

[Series 7: T1 · sagittal · 4.0mm · 0.88mm/px · 7 of 15 slices shown (1 of 2)]
[im 1/15]
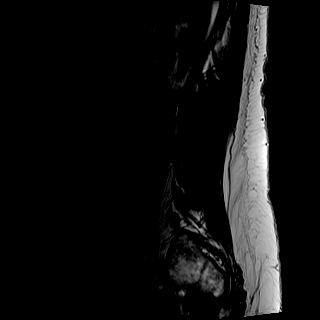
[im 3/15]
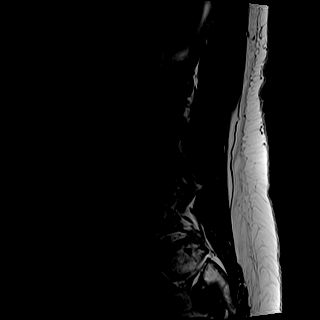
[im 5/15]
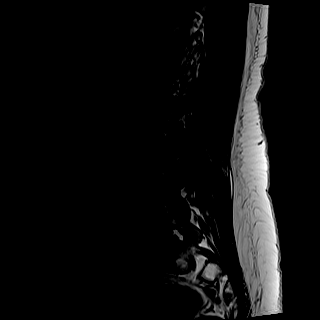
[im 8/15]
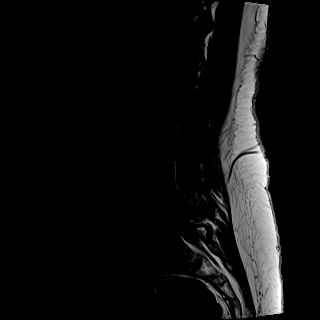
[im 10/15]
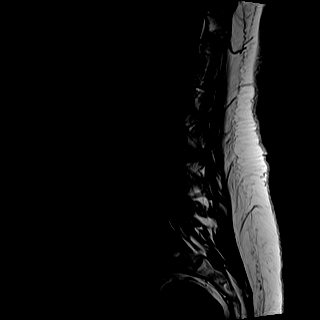
[im 12/15]
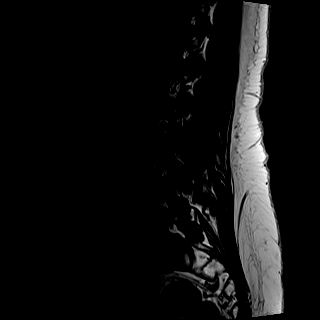
[im 15/15]
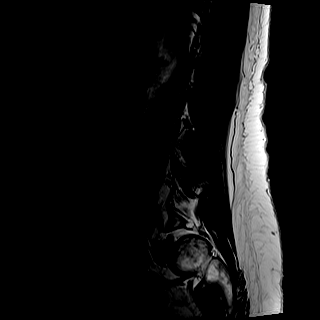

[Series 8: T2 · axial · 4.0mm · 0.57mm/px · z∈[-90,+76]mm · 8 of 31 slices shown (2 of 2)]
[im 1/31]
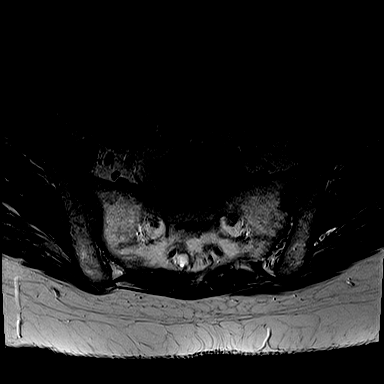
[im 5/31]
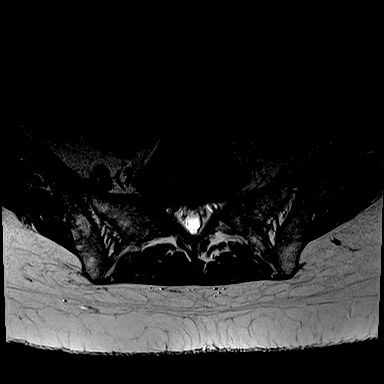
[im 10/31]
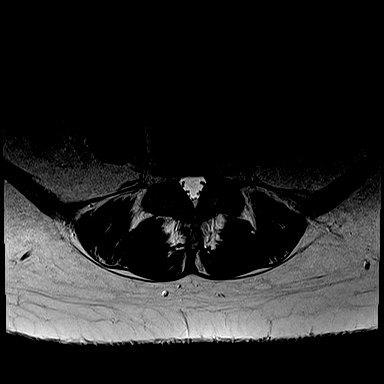
[im 14/31]
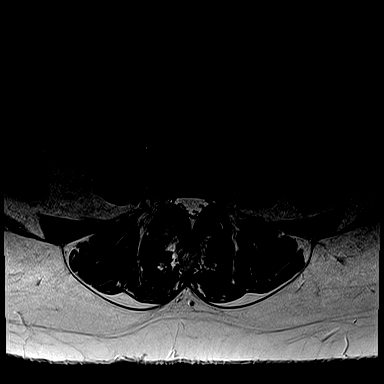
[im 17/31]
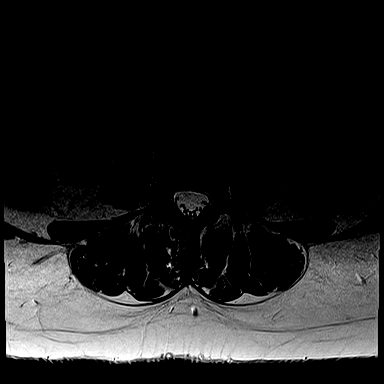
[im 21/31]
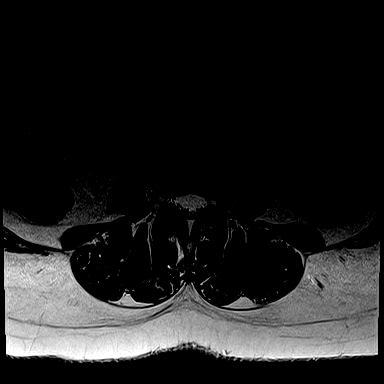
[im 26/31]
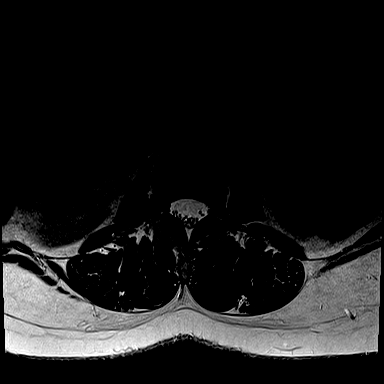
[im 31/31]
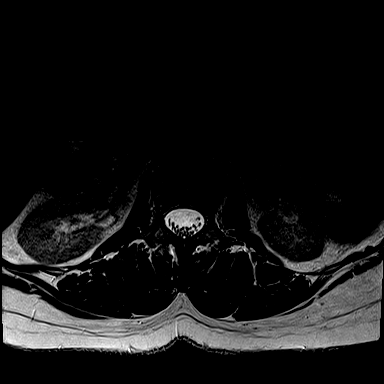

[Series 9: T1 · axial · 4.0mm · 0.34mm/px · z∈[-90,+51]mm · 5 of 31 slices shown (2 of 2)]
[im 1/31]
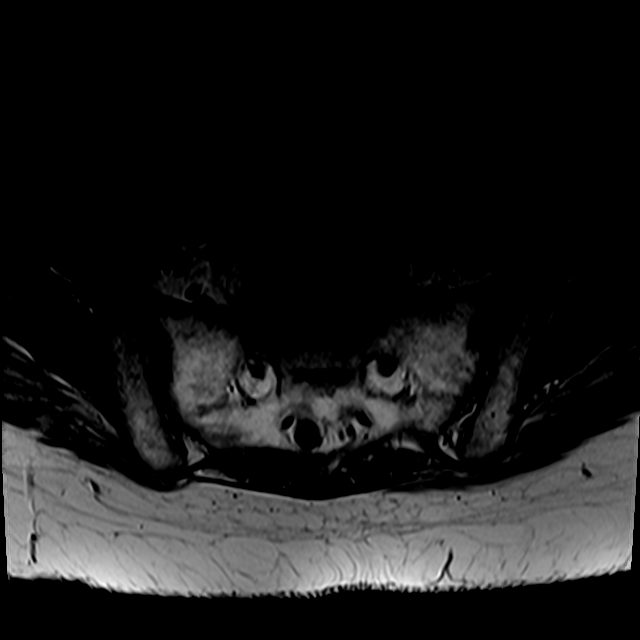
[im 5/31]
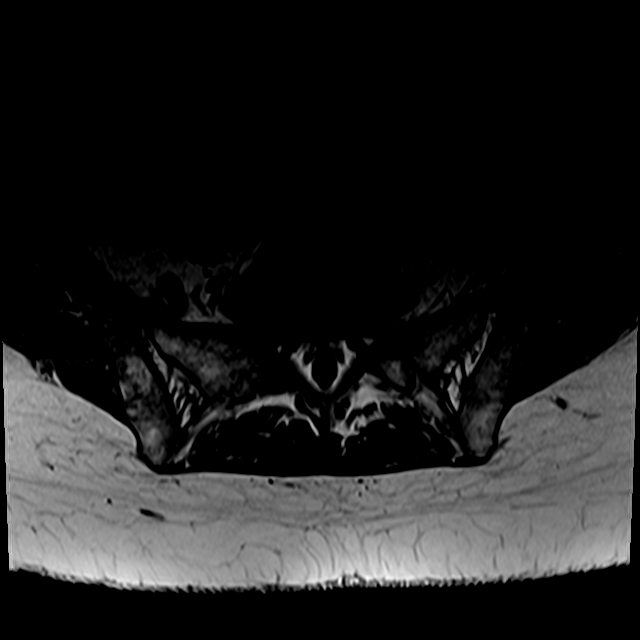
[im 10/31]
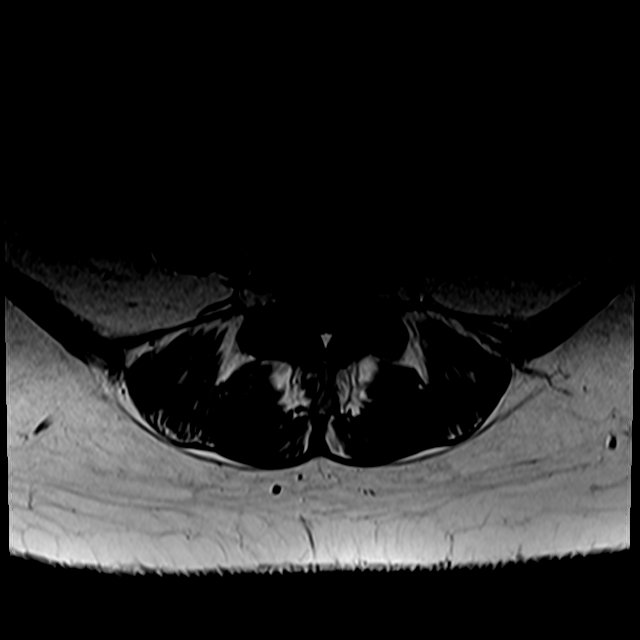
[im 17/31]
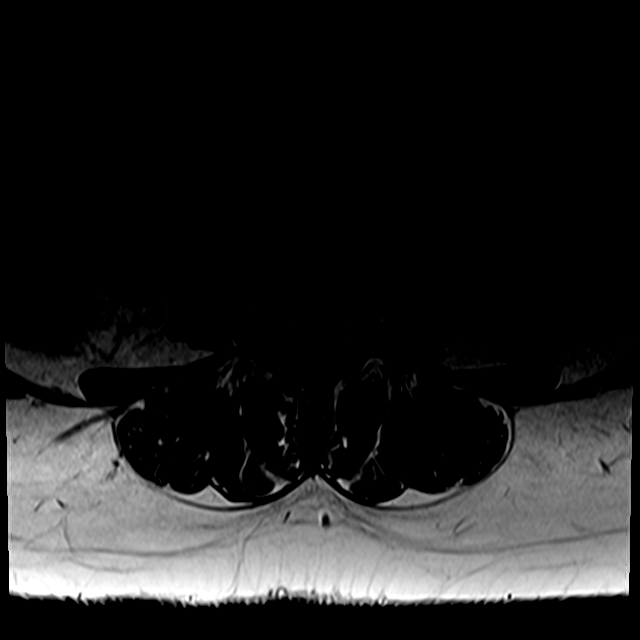
[im 26/31]
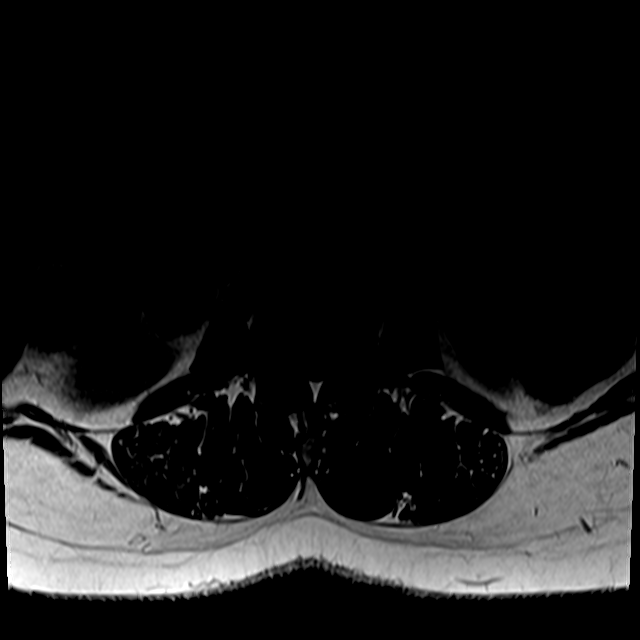

[26 of 48 positions shown; findings below may reference images not displayed]

FINDINGS: Segmentation: There appear to be 6 non-rib-bearing lumbar type
vertebral bodies on prior lumbar spine radiographs performed
[DATE]. For the purposes of this dictation, there is
lumbarization of the S1 segment with a formed intervertebral disc at
the S1-S2 level.

Alignment:  No significant spondylolisthesis.

Vertebrae: Vertebral body height is maintained. No marrow edema or
suspicious osseous lesion.

Conus medullaris and cauda equina: Conus extends to the L1-L2 level.
No signal abnormality within the visualized distal spinal cord.

Paraspinal and other soft tissues: No abnormality identified within
included portions of the abdomen/retroperitoneum. Paraspinal soft
tissues within normal limits.

Disc levels:

Mild multilevel disc degeneration.

L1-L2: Not included on axial imaging. No disc herniation. No
significant canal or foraminal stenosis.

L2-L3: No disc herniation. No significant canal or foraminal
stenosis.

L3-L4: Small disc bulge. Mild facet arthrosis. No significant spinal
canal or neural foraminal narrowing.

L4-L5: Small disc bulge. Mild facet arthrosis/ligamentum flavum
hypertrophy. Mild bilateral subarticular narrowing without
significant central canal stenosis. No significant neural foraminal
narrowing.

L5-S1: Disc bulge with endplate spurring. Facet arthrosis/ligamentum
flavum hypertrophy. Moderate bilateral subarticular narrowing with
slight crowding of the descending S1 nerve roots. No significant
central canal stenosis. Mild left neural foraminal narrowing.

S1-S2: Small disc bulge with endplate spurring. Mild facet
arthrosis. No significant spinal canal stenosis. Mild left neural
foraminal narrowing.
IMPRESSION: For the purposes of this dictation, there is lumbarization of the S1
segment with a well-formed S1-S2 intervertebral disc.

Lumbar spondylosis as detailed.

At L5-S1, there is multifactorial moderate subarticular narrowing
with slight crowding of the bilateral descending S1 nerve roots.
Mild left neural foraminal narrowing at this level.

Disc bulge and osteophyte ridge contribute mild S1-S2 left neural
foraminal narrowing.

## 2018-12-16 ENCOUNTER — Telehealth: Payer: Self-pay | Admitting: Family Medicine

## 2018-12-16 DIAGNOSIS — M79605 Pain in left leg: Secondary | ICD-10-CM

## 2018-12-16 DIAGNOSIS — M5136 Other intervertebral disc degeneration, lumbar region: Secondary | ICD-10-CM

## 2018-12-16 NOTE — Telephone Encounter (Signed)
Lumbar MRI scan shows arthritic changes at several levels, there is also a bulging disc at L5-S1 which causes narrowing of the nerve openings and could potentially cause irritation of the S1 nerve roots.  No clear-cut indication for surgery, but this could be a possible source of the chronic leg pain.  Presuming symptoms are still present, I recommend referral to Dr. Ernestina Patches for epidural steroid injection to see if symptoms start to improve.

## 2018-12-17 NOTE — Telephone Encounter (Signed)
Left message on home voice mail to call back for results.

## 2018-12-17 NOTE — Telephone Encounter (Signed)
I spoke with the patient, advising her of her MRI results and the plan. She is still having the leg pain, so will be referring to Dr. Ernestina Patches for lumbar ESI.

## 2019-01-13 ENCOUNTER — Encounter: Payer: Self-pay | Admitting: Physical Medicine and Rehabilitation

## 2019-01-13 ENCOUNTER — Ambulatory Visit: Payer: Self-pay

## 2019-01-13 ENCOUNTER — Other Ambulatory Visit: Payer: Self-pay

## 2019-01-13 ENCOUNTER — Ambulatory Visit (INDEPENDENT_AMBULATORY_CARE_PROVIDER_SITE_OTHER): Payer: Medicare Other | Admitting: Physical Medicine and Rehabilitation

## 2019-01-13 VITALS — BP 122/84 | HR 68

## 2019-01-13 DIAGNOSIS — M5416 Radiculopathy, lumbar region: Secondary | ICD-10-CM | POA: Diagnosis not present

## 2019-01-13 MED ORDER — METHYLPREDNISOLONE ACETATE 80 MG/ML IJ SUSP
80.0000 mg | Freq: Once | INTRAMUSCULAR | Status: AC
  Administered 2019-01-13: 80 mg

## 2019-01-13 NOTE — Progress Notes (Signed)
Pt states pain in the left leg all the way down. Pt states pain started more than 6 months ago. Pt states any activity makes pain worse. Ibuprofen helps some with pain.   .Numeric Pain Rating Scale and Functional Assessment Average Pain 9   In the last MONTH (on 0-10 scale) has pain interfered with the following?  1. General activity like being  able to carry out your everyday physical activities such as walking, climbing stairs, carrying groceries, or moving a chair?  Rating(8)   +Driver, -BT, -Dye Allergies.

## 2019-01-13 NOTE — Procedures (Signed)
Lumbar Epidural Steroid Injection - Interlaminar Approach with Fluoroscopic Guidance  Patient: Kathryn Zavala      Date of Birth: Aug 16, 1955 MRN: [id number] PCP: Susy Frizzle, MD      Visit Date: 01/13/2019   Universal Protocol:     Consent Given By: the patient  Position: PRONE  Additional Comments: Vital signs were monitored before and after the procedure. Patient was prepped and draped in the usual sterile fashion. The correct patient, procedure, and site was verified.   Injection Procedure Details:  Procedure Site One Meds Administered:  Meds ordered this encounter  Medications  . methylPREDNISolone acetate (DEPO-MEDROL) injection 80 mg     Laterality: Left  Location/Site: Transitional S1 segment (lumbarized) L5-S1  Needle size: 20 G  Needle type: Tuohy  Needle Placement: Paramedian epidural  Findings:   -Comments: Excellent flow of contrast into the epidural space.  Procedure Details: Using a paramedian approach from the side mentioned above, the region overlying the inferior lamina was localized under fluoroscopic visualization and the soft tissues overlying this structure were infiltrated with 4 ml. of 1% Lidocaine without Epinephrine. The Tuohy needle was inserted into the epidural space using a paramedian approach.   The epidural space was localized using loss of resistance along with lateral and bi-planar fluoroscopic views.  After negative aspirate for air, blood, and CSF, a 2 ml. volume of Isovue-250 was injected into the epidural space and the flow of contrast was observed. Radiographs were obtained for documentation purposes.    The injectate was administered into the level noted above.   Additional Comments:  The patient tolerated the procedure well Dressing: 2 x 2 sterile gauze and Band-Aid    Post-procedure details: Patient was observed during the procedure. Post-procedure instructions were reviewed.  Patient left the clinic in  stable condition.

## 2019-01-14 NOTE — Progress Notes (Signed)
[place] - 63 y.o. female MRN [id number]  Date of birth: Jun 12, 1955  Office Visit Note: Visit Date: 01/13/2019 PCP: Susy Frizzle, MD Referred by: Susy Frizzle, MD  Subjective: Chief Complaint  Patient presents with  . Left Leg - Pain   HPI: Kathryn Zavala is a 63 y.o. female who comes in today At the request of Dr. Eunice Blase for diagnostic and hopefully therapeutic lumbar epidural injection.  Patient's history is that she is having left radicular type pain in the lateral leg and shin to the great toe.  She has had total knee replacement on the left with a revision a couple years ago.  She has had medication management and physical therapy without much relief.  She is frustrated with not being able to help with the pain at this point.  The distribution of symptoms could be L5 radicular symptoms but could also be fibular nerve/peroneal nerve distribution.  MRI was performed of the lumbar spine and this is reviewed with the patient.  There is facet joint arthritis at L5-S1 with some lateral recess narrowing which she would think would affect more of the S1 nerve root than the L5 nerve root.  If she does not get much relief with epidural injection I would suggest electrodiagnostic study of the left lower limb to potentially rule out fibular nerve or peroneal nerve issue.  I would prefer this being done with neurology if it is obtained instead of in our office.  ROS Otherwise per HPI.  Assessment & Plan: Visit Diagnoses:  1. Lumbar radiculopathy     Plan: No additional findings.   Meds & Orders:  Meds ordered this encounter  Medications  . methylPREDNISolone acetate (DEPO-MEDROL) injection 80 mg    Orders Placed This Encounter  Procedures  . XR C-ARM NO REPORT  . Epidural Steroid injection    Follow-up: No follow-ups on file.   Procedures: No procedures performed  Lumbar Epidural Steroid Injection - Interlaminar Approach with Fluoroscopic Guidance   Patient: Kathryn Zavala      Date of Birth: September 02, 1955 MRN: [id number] PCP: Susy Frizzle, MD      Visit Date: 01/13/2019   Universal Protocol:     Consent Given By: the patient  Position: PRONE  Additional Comments: Vital signs were monitored before and after the procedure. Patient was prepped and draped in the usual sterile fashion. The correct patient, procedure, and site was verified.   Injection Procedure Details:  Procedure Site One Meds Administered:  Meds ordered this encounter  Medications  . methylPREDNISolone acetate (DEPO-MEDROL) injection 80 mg     Laterality: Left  Location/Site: Transitional S1 segment (lumbarized) L5-S1  Needle size: 20 G  Needle type: Tuohy  Needle Placement: Paramedian epidural  Findings:   -Comments: Excellent flow of contrast into the epidural space.  Procedure Details: Using a paramedian approach from the side mentioned above, the region overlying the inferior lamina was localized under fluoroscopic visualization and the soft tissues overlying this structure were infiltrated with 4 ml. of 1% Lidocaine without Epinephrine. The Tuohy needle was inserted into the epidural space using a paramedian approach.   The epidural space was localized using loss of resistance along with lateral and bi-planar fluoroscopic views.  After negative aspirate for air, blood, and CSF, a 2 ml. volume of Isovue-250 was injected into the epidural space and the flow of contrast was observed. Radiographs were obtained for documentation purposes.    The injectate was administered into the level noted above.  Additional Comments:  The patient tolerated the procedure well Dressing: 2 x 2 sterile gauze and Band-Aid    Post-procedure details: Patient was observed during the procedure. Post-procedure instructions were reviewed.  Patient left the clinic in stable condition.   Clinical History: MRI LUMBAR SPINE WITHOUT CONTRAST  TECHNIQUE:  Multiplanar, multisequence MR imaging of the lumbar spine was performed. No intravenous contrast was administered.  COMPARISON:  Lumbar spine radiographs 11/08/2018  FINDINGS: Segmentation: There appear to be 6 non-rib-bearing lumbar type vertebral bodies on prior lumbar spine radiographs performed 11/08/2018. For the purposes of this dictation, there is lumbarization of the S1 segment with a formed intervertebral disc at the S1-S2 level.  Alignment:  No significant spondylolisthesis.  Vertebrae: Vertebral body height is maintained. No marrow edema or suspicious osseous lesion.  Conus medullaris and cauda equina: Conus extends to the L1-L2 level. No signal abnormality within the visualized distal spinal cord.  Paraspinal and other soft tissues: No abnormality identified within included portions of the abdomen/retroperitoneum. Paraspinal soft tissues within normal limits.  Disc levels:  Mild multilevel disc degeneration.  L1-L2: Not included on axial imaging. No disc herniation. No significant canal or foraminal stenosis.  L2-L3: No disc herniation. No significant canal or foraminal stenosis.  L3-L4: Small disc bulge. Mild facet arthrosis. No significant spinal canal or neural foraminal narrowing.  L4-L5: Small disc bulge. Mild facet arthrosis/ligamentum flavum hypertrophy. Mild bilateral subarticular narrowing without significant central canal stenosis. No significant neural foraminal narrowing.  L5-S1: Disc bulge with endplate spurring. Facet arthrosis/ligamentum flavum hypertrophy. Moderate bilateral subarticular narrowing with slight crowding of the descending S1 nerve roots. No significant central canal stenosis. Mild left neural foraminal narrowing.  S1-S2: Small disc bulge with endplate spurring. Mild facet arthrosis. No significant spinal canal stenosis. Mild left neural foraminal narrowing.  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  Lumbar spondylosis as detailed.  At L5-S1, there is multifactorial moderate subarticular narrowing with slight crowding of the bilateral descending S1 nerve roots. Mild left neural foraminal narrowing at this level.  Disc bulge and osteophyte ridge contribute mild S1-S2 left neural foraminal narrowing.   Electronically Signed   By: Kellie Simmering DO   On: 12/15/2018 16:52   She reports that she has quit smoking. Her smoking use included cigarettes. She quit smokeless tobacco use about 33 years ago. No results for input(s): HGBA1C, LABURIC in the last 8760 hours.  Objective:  VS:  HT:    WT:   BMI:     BP:122/84  HR:68bpm  TEMP: ( )  RESP:  Physical Exam  Ortho Exam Imaging: Xr C-arm No Report  Result Date: 01/13/2019 Please see Notes tab for imaging impression.   Past Medical/Family/Surgical/Social History: Medications & Allergies reviewed per EMR, new medications updated. Patient Active Problem List   Diagnosis Date Noted  . Stroke ([place]) 10/29/2017  . Depression 10/29/2017  . Paronychia of finger of right hand 12/21/2016  . Presence of left artificial knee joint 09/04/2013  . Knee pain 11/20/2012  . Colon cancer ([place]) 07/16/2012  . Metastasis to liver ([place]) 03/09/2009   Past Medical History:  Diagnosis Date  . Cancer [facility])    2010 Colon with mets to the liver, partial colectomy, chemo, and liver met resection  . Cataract    History reviewed. No pertinent family history. Past Surgical History:  Procedure Laterality Date  . ABDOMINAL HYSTERECTOMY     due to fibroids  at 35, [facility] BSO  . COLON SURGERY    . JOINT REPLACEMENT     TKR x 2 on left knee  . LIVER SURGERY     Social History   Occupational History  . Not on file  Tobacco Use  . Smoking status: Former Smoker    Types: Cigarettes  . Smokeless tobacco: Former Systems developer    Quit date: 05/19/1985  Substance and  Sexual Activity  . Alcohol use: Yes    Alcohol/week: 0.0 standard drinks    Comment: beer every other day  . Drug use: No  . Sexual activity: Not on file

## 2019-03-13 ENCOUNTER — Encounter (HOSPITAL_COMMUNITY): Payer: Self-pay

## 2019-03-13 ENCOUNTER — Other Ambulatory Visit: Payer: Self-pay

## 2019-03-13 ENCOUNTER — Ambulatory Visit (HOSPITAL_COMMUNITY)
Admission: EM | Admit: 2019-03-13 | Discharge: 2019-03-13 | Disposition: A | Discharge status: Home / Self Care | Payer: Medicare Other | Attending: Family Medicine | Admitting: Family Medicine

## 2019-03-13 DIAGNOSIS — M549 Dorsalgia, unspecified: Secondary | ICD-10-CM | POA: Diagnosis not present

## 2019-03-13 DIAGNOSIS — R6883 Chills (without fever): Secondary | ICD-10-CM | POA: Insufficient documentation

## 2019-03-13 DIAGNOSIS — Z20822 Contact with and (suspected) exposure to covid-19: Secondary | ICD-10-CM | POA: Diagnosis not present

## 2019-03-13 DIAGNOSIS — R519 Headache, unspecified: Secondary | ICD-10-CM | POA: Insufficient documentation

## 2019-03-13 DIAGNOSIS — B349 Viral infection, unspecified: Secondary | ICD-10-CM | POA: Diagnosis not present

## 2019-03-13 DIAGNOSIS — R630 Anorexia: Secondary | ICD-10-CM | POA: Insufficient documentation

## 2019-03-13 DIAGNOSIS — Z87891 Personal history of nicotine dependence: Secondary | ICD-10-CM | POA: Diagnosis not present

## 2019-03-13 DIAGNOSIS — R5383 Other fatigue: Secondary | ICD-10-CM | POA: Insufficient documentation

## 2019-03-13 DIAGNOSIS — M545 Low back pain: Secondary | ICD-10-CM | POA: Diagnosis not present

## 2019-03-13 DIAGNOSIS — F329 Major depressive disorder, single episode, unspecified: Secondary | ICD-10-CM | POA: Diagnosis not present

## 2019-03-13 DIAGNOSIS — R05 Cough: Secondary | ICD-10-CM | POA: Diagnosis not present

## 2019-03-13 DIAGNOSIS — Z79899 Other long term (current) drug therapy: Secondary | ICD-10-CM | POA: Diagnosis not present

## 2019-03-13 LAB — POCT URINALYSIS DIP (DEVICE)
Bilirubin Urine: NEGATIVE
Glucose, UA: NEGATIVE mg/dL
Hgb urine dipstick: NEGATIVE
Ketones, ur: NEGATIVE mg/dL
Leukocytes,Ua: NEGATIVE
Nitrite: NEGATIVE
Protein, ur: NEGATIVE mg/dL
Specific Gravity, Urine: >=1.03 (ref 1.005–1.030)
Urobilinogen, UA: 1 mg/dL (ref 0.0–1.0)
pH: 5.5 (ref 5.0–8.0)

## 2019-03-13 NOTE — ED Triage Notes (Signed)
Pt presents with complaints of headache, low back pain, chills and fatigue since Saturday. Denies any known exposure to covid. Pt denies any other symptoms at this time.

## 2019-03-13 NOTE — ED Provider Notes (Signed)
[place]    CSN: [id number] Arrival date & time: 03/13/19  0810      History   Chief Complaint Chief Complaint  Patient presents with  . Chills  . Back Pain  . Headache    HPI Kathryn Zavala is a 64 y.o. female.   Patient reports to urgent care today for headache, fatigue and low back pain that started on Saturday (5 days ago). Headache is described to be frontal and moderate in pain. She has taken theraflu and airborne medications with mild relief at best. She also reports fatigue that has been stable since symptoms began. She endorses low back pain that began around the same time. She does endorse an intermittent cough that is non-productive and a slight runny nose. Denies chest pain. She reports occasional episodes where she will need to take a deeper breath to feel like she could breath, but this resolves immediately. She denies shortness of breath other than these episodes. There is no predictability to those episodes.   She does endorse chills and general bodyaches but denies fever. She reports a loss of appetite since Saturday as well. She denies nausea. She has drank water but has had minimal food intake. She denies abdominal discomfort and diarrhea. Denies painful urination, frequency or urgency.   She denies known sick contacts or COVID exposures.     Past Medical History:  Diagnosis Date  . Cancer [facility])    2010 Colon with mets to the liver, partial colectomy, chemo, and liver met resection  . Cataract     Patient Active Problem List   Diagnosis Date Noted  . Stroke ([place]) 10/29/2017  . Depression 10/29/2017  . Paronychia of finger of right hand 12/21/2016  . Presence of left artificial knee joint 09/04/2013  . Knee pain 11/20/2012  . Colon cancer ([place]) 07/16/2012  . Metastasis to liver ([place]) 03/09/2009    Past Surgical History:  Procedure Laterality Date  . ABDOMINAL HYSTERECTOMY     due to fibroids at 35, [facility] BSO  . COLON SURGERY    .  JOINT REPLACEMENT     TKR x 2 on left knee  . LIVER SURGERY      OB History   No obstetric history on file.      Home Medications    Prior to Admission medications   Medication Sig Start Date End Date Taking? Authorizing Provider  diazepam (VALIUM) 5 MG tablet 1 PO 1 hour before MRI, repeat prn 11/08/18   Hilts, Michael, MD    Family History Family History  Problem Relation Age of Onset  . Cancer Mother   . Cancer Father     Social History Social History   Tobacco Use  . Smoking status: Former Smoker    Types: Cigarettes  . Smokeless tobacco: Former Systems developer    Quit date: 05/19/1985  Substance Use Topics  . Alcohol use: Yes    Alcohol/week: 0.0 standard drinks    Comment: beer every other day  . Drug use: No     Allergies   Patient has no known allergies.   Review of Systems Review of Systems  Constitutional: Positive for appetite change, chills and fatigue. Negative for fever.  HENT: Positive for rhinorrhea. Negative for congestion, ear pain, sinus pressure, sinus pain and sore throat.   Eyes: Negative for pain and visual disturbance.  Respiratory: Positive for cough. Negative for choking, chest tightness, shortness of breath and wheezing.   Cardiovascular: Negative for chest pain and palpitations.  Gastrointestinal: Negative for abdominal pain, constipation, diarrhea, nausea and vomiting.  Genitourinary: Negative for dysuria, frequency, hematuria and urgency.  Musculoskeletal: Positive for back pain and myalgias. Negative for arthralgias.  Skin: Negative for color change and rash.  Neurological: Positive for headaches. Negative for dizziness, seizures, syncope and light-headedness.  All other systems reviewed and are negative.    Physical Exam Triage Vital Signs ED Triage Vitals  Enc Vitals Group     BP 03/13/19 0845 123/86     Pulse Rate 03/13/19 0845 74     Resp 03/13/19 0845 18     Temp 03/13/19 0845 98.6 F (37 C)     Temp src --      SpO2  03/13/19 0845 99 %     Weight --      Height --      Head Circumference --      Peak Flow --      Pain Score 03/13/19 0842 4     Pain Loc --      Pain Edu? --      Excl. in [place]? --    No data found.  Updated Vital Signs BP 123/86   Pulse 74   Temp 98.6 F (37 C)   Resp 18   SpO2 99%   Visual Acuity Right Eye Distance:   Left Eye Distance:   Bilateral Distance:    Right Eye Near:   Left Eye Near:    Bilateral Near:     Physical Exam Vitals and nursing note reviewed.  Constitutional:      General: She is not in acute distress.    Appearance: She is well-developed. She is ill-appearing (fatigued appearing).  HENT:     Head: Normocephalic and atraumatic.     Mouth/Throat:     Mouth: Mucous membranes are moist.     Pharynx: Oropharynx is clear.  Eyes:     General: No scleral icterus.    Extraocular Movements: Extraocular movements intact.     Conjunctiva/sclera: Conjunctivae normal.     Pupils: Pupils are equal, round, and reactive to light.  Cardiovascular:     Rate and Rhythm: Normal rate and regular rhythm.     Heart sounds: No murmur. No friction rub. No gallop.   Pulmonary:     Effort: Pulmonary effort is normal. No respiratory distress.     Breath sounds: Normal breath sounds. No wheezing, rhonchi or rales.  Abdominal:     Palpations: Abdomen is soft.     Tenderness: There is no abdominal tenderness.  Musculoskeletal:        General: Tenderness (left leg pain that has been present for a long time) present. No swelling.     Cervical back: Normal range of motion and neck supple.  Lymphadenopathy:     Cervical: No cervical adenopathy.  Skin:    General: Skin is warm and dry.     Capillary Refill: Capillary refill takes less than 2 seconds.     Findings: No rash.  Neurological:     Mental Status: She is alert and oriented to person, place, and time. Mental status is at baseline.  Psychiatric:        Mood and Affect: Mood normal.        Speech: Speech  normal.        Behavior: Behavior normal.      UC Treatments / Results  Labs (all labs ordered are listed, but only abnormal results are displayed) Labs Reviewed  NOVEL CORONAVIRUS,  NAA (HOSP ORDER, SEND-OUT TO REF LAB; TAT 18-24 HRS)  POCT URINALYSIS DIP (DEVICE)    EKG   Radiology No results found.  Procedures Procedures (including critical care time)  Medications Ordered in UC Medications - No data to display  Initial Impression / Assessment and Plan / UC Course  I have reviewed the triage vital signs and the nursing notes.  Pertinent labs & imaging results that were available during my care of the patient were reviewed by me and considered in my medical decision making (see chart for details).     #Viral Illness Fatigue, headache and other vague URI symptoms present. UA negative, except for [place] elevated, without symptoms. COVID PCR sent. Likely dehydration but able to tolerate oral intake currently. - Discussed need to increase PO fluids with electrolytes and eat small meals as tolerated - Return and Follow up precautions discussed. - ED precautions given   Final Clinical Impressions(s) / UC Diagnoses   Final diagnoses:  Viral illness     Discharge Instructions     Take tylenol 338m tablet x 2 up to every 6 hours for sore throat, fever and body aches. Do not exceed 8 tablets in 24 hours  Drink atleast 64 ounces of water each day and include some sort of electrolyte rich beverage; gatorade, powerade, propel or pedialyte  Eat small meals, such as soups and crackers or anything appetizing  If symptoms are not improving in 1 week, please return, if you develop productive cough or worsening of symptoms in general please return  Go directly to the Emergency Department or call 911 if you have severe chest pain, shortness of breath, severe diarrhea or feel as though you might pass out.   If your Covid-19 test is positive, you will receive a phone call  [facility]. Negative test results are not called. Both positive and negative results area always visible on MyChart. If you do not have a MyChart account, sign up instructions are in your discharge papers.   Persons who are directed to care for themselves at home may discontinue isolation under the following conditions:  . At least 10 days have passed since symptom onset and . At least 24 hours have passed without running a fever (this means without the use of fever-reducing medications) and . Other symptoms have improved.  Persons infected with COVID-19 who never develop symptoms may discontinue isolation and other precautions 10 days after the date of their first positive COVID-19 test.     ED Prescriptions    None     PDMP not reviewed this encounter.   DPurnell Shoemaker PA-C 03/13/19 [phone]

## 2019-03-13 NOTE — Discharge Instructions (Signed)
Take tylenol 325mg  tablet x 2 up to every 6 hours for sore throat, fever and body aches. Do not exceed 8 tablets in 24 hours  Drink atleast 64 ounces of water each day and include some sort of electrolyte rich beverage; gatorade, powerade, propel or pedialyte  Eat small meals, such as soups and crackers or anything appetizing  If symptoms are not improving in 1 week, please return, if you develop productive cough or worsening of symptoms in general please return  Go directly to the Emergency Department or call 911 if you have severe chest pain, shortness of breath, severe diarrhea or feel as though you might pass out.   If your Covid-19 test is positive, you will receive a phone call from [facility] regarding your results. Negative test results are not called. Both positive and negative results area always visible on MyChart. If you do not have a MyChart account, sign up instructions are in your discharge papers.   Persons who are directed to care for themselves at home may discontinue isolation under the following conditions:   At least 10 days have passed since symptom onset and  At least 24 hours have passed without running a fever (this means without the use of fever-reducing medications) and  Other symptoms have improved.  Persons infected with COVID-19 who never develop symptoms may discontinue isolation and other precautions 10 days after the date of their first positive COVID-19 test.

## 2019-03-15 LAB — NOVEL CORONAVIRUS, NAA (HOSP ORDER, SEND-OUT TO REF LAB; TAT 18-24 HRS): SARS-CoV-2, NAA: NOT DETECTED

## 2019-04-14 ENCOUNTER — Encounter: Payer: Self-pay | Admitting: Physical Medicine and Rehabilitation

## 2019-04-16 ENCOUNTER — Other Ambulatory Visit: Payer: Self-pay

## 2019-04-16 ENCOUNTER — Ambulatory Visit (INDEPENDENT_AMBULATORY_CARE_PROVIDER_SITE_OTHER): Payer: Medicare Other | Admitting: Family Medicine

## 2019-04-16 ENCOUNTER — Encounter: Payer: Self-pay | Admitting: Family Medicine

## 2019-04-16 DIAGNOSIS — R200 Anesthesia of skin: Secondary | ICD-10-CM

## 2019-04-16 DIAGNOSIS — R208 Other disturbances of skin sensation: Secondary | ICD-10-CM | POA: Diagnosis not present

## 2019-04-16 DIAGNOSIS — M79605 Pain in left leg: Secondary | ICD-10-CM

## 2019-04-16 DIAGNOSIS — M5136 Other intervertebral disc degeneration, lumbar region: Secondary | ICD-10-CM | POA: Diagnosis not present

## 2019-04-16 MED ORDER — GABAPENTIN 300 MG PO CAPS: ORAL_CAPSULE | ORAL | 3 refills | Status: DC

## 2019-04-16 NOTE — Progress Notes (Signed)
    SUBJECTIVE:   CHIEF COMPLAINT / HPI:  Left knee pain  S/p total knee x 2. Started to hurt "a while" back. A lot of imaging has been done and there is no diagnosis. 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 Within a few hours of shift, is in excruciating pain. Reports that back injection recently did not help.   OBJECTIVE:   There were no vitals taken for this visit.  Well appearing female, NAD Mild effusion of appreciated at lateral left knee.No TTP of anterior knee.  Limited active dorsiflexion due to pain as well as with resistance.  Continued left EHL weakness on left side. No sensory deficits   ASSESSMENT/PLAN:   1. Left lower anterior leg pain  Continued pain with no improvement. Will trial gabapentin for suspected nerve pain given weakness of EHL and schedule EMG. Follow up after studies.   Wilber Oliphant, MD [place]

## 2019-04-16 NOTE — Progress Notes (Signed)
I saw and examined the patient with Dr. Maudie Mercury and agree with assessment and plan as outlined.    Unfortunately not much relief with epidural steroid injection.  Continues to have constant left lower leg pain down the anterior aspect of her leg.  Exam today reveals persistent EHL weakness.  We will order nerve conduction studies and start treatment with gabapentin.

## 2019-06-02 ENCOUNTER — Ambulatory Visit
Admission: EM | Admit: 2019-06-02 | Discharge: 2019-06-02 | Disposition: A | Discharge status: Other Acute Inpatient Hospital | Payer: Medicare Other

## 2019-06-02 ENCOUNTER — Emergency Department (HOSPITAL_COMMUNITY)
Admission: EM | Admit: 2019-06-02 | Discharge: 2019-06-02 | Disposition: A | Discharge status: Home / Self Care | Payer: Medicare Other | Attending: Emergency Medicine | Admitting: Emergency Medicine

## 2019-06-02 ENCOUNTER — Encounter (HOSPITAL_COMMUNITY): Payer: Self-pay

## 2019-06-02 ENCOUNTER — Other Ambulatory Visit: Payer: Self-pay

## 2019-06-02 DIAGNOSIS — R2242 Localized swelling, mass and lump, left lower limb: Secondary | ICD-10-CM | POA: Diagnosis not present

## 2019-06-02 DIAGNOSIS — M79605 Pain in left leg: Secondary | ICD-10-CM | POA: Insufficient documentation

## 2019-06-02 DIAGNOSIS — Z5321 Procedure and treatment not carried out due to patient leaving prior to being seen by health care provider: Secondary | ICD-10-CM | POA: Diagnosis not present

## 2019-06-02 NOTE — ED Triage Notes (Signed)
Pt states her left leg is swollen, painful, and hard to move. Pt denies injury or trauma. Pt states this has been going on for months and has gotten progressively worse.

## 2019-06-02 NOTE — ED Notes (Signed)
Pt here with severe pain; no injury; pt unable to walk; per family concerned about blood clot; informed of our limitations and they decided to take pt to ED

## 2019-06-03 ENCOUNTER — Encounter: Payer: Self-pay | Admitting: Family Medicine

## 2019-06-03 ENCOUNTER — Telehealth: Payer: Self-pay | Admitting: Family Medicine

## 2019-06-03 ENCOUNTER — Ambulatory Visit (INDEPENDENT_AMBULATORY_CARE_PROVIDER_SITE_OTHER): Payer: Medicare Other | Admitting: Family Medicine

## 2019-06-03 DIAGNOSIS — M79605 Pain in left leg: Secondary | ICD-10-CM

## 2019-06-03 DIAGNOSIS — R208 Other disturbances of skin sensation: Secondary | ICD-10-CM | POA: Diagnosis not present

## 2019-06-03 DIAGNOSIS — R200 Anesthesia of skin: Secondary | ICD-10-CM

## 2019-06-03 MED ORDER — OXYCODONE-ACETAMINOPHEN 5-325 MG PO TABS: 1.0000 | ORAL_TABLET | Freq: Four times a day (QID) | ORAL | 0 refills | Status: DC | PRN

## 2019-06-03 MED ORDER — BACLOFEN 10 MG PO TABS: 5.0000 mg | ORAL_TABLET | Freq: Three times a day (TID) | ORAL | 3 refills | Status: DC | PRN

## 2019-06-03 NOTE — Telephone Encounter (Signed)
Kathryn Zavala with [name] called asking if she can add maximum 6 tablets per 24 hours on the rx so that it'll meet the first time fill requirements? Kathryn Zavala would like a call back with this answer  Amanda# [phone]

## 2019-06-03 NOTE — Progress Notes (Signed)
Ketorolac 60 mg (2cc of 30 mg/mL) given IM right upper outer gluteal region. Lot [id number] Exp 10/21 [place] [id number]

## 2019-06-03 NOTE — Telephone Encounter (Signed)
I called and spoke with [name], giving her the ok for maximum 6 per 24 hrs (meets MME requirements).

## 2019-06-03 NOTE — Telephone Encounter (Signed)
Ok

## 2019-06-03 NOTE — Progress Notes (Signed)
Office Visit Note   Patient: Kathryn Zavala           Date of Birth: July 26, 1955           MRN: [id number] Visit Date: 06/03/2019 Requested by: Susy Frizzle, MD [address] [place],  [place] [address] PCP: Susy Frizzle, MD  Subjective: Chief Complaint  Patient presents with  . Left Leg - Pain    Burning pain in the thigh and down the leg - progressively worsening since last visit (04/16/19). Hurts to bear weight on the leg.    HPI: She is here with worsening left leg pain.  For the past 2 days her pain has become so severe that she went to the ER last night.  The wait was too long so she came home.  She was given some oxycodone at home last night and it helped her sleep.  Gabapentin does not seem to be helping.  She was never called to schedule nerve studies after I saw her last visit.              ROS: No bowel or bladder dysfunction.  All other systems were reviewed and are negative.  Objective: Vital Signs: There were no vitals taken for this visit.  Physical Exam:  General:  Alert and oriented, in no acute distress. Pulm:  Breathing unlabored. Psy:  Normal mood, congruent affect. Skin: No rash Right leg: No change in the edema in both legs.  There is no warmth or erythema around the knee.  She points to the anterior thigh as the location of the beginning of her pain and it hurts all the way down to her foot.  Still has slight weakness with EHL testing and with ankle dorsiflexion.  Imaging: No results found.  Assessment & Plan: 1.  Worsening chronic left leg pain, etiology uncertain.  Could be nerve compression. -Proceed with nerve studies.  Oxycodone for severe pain, baclofen for muscle spasm.  Out of work until Monday.     Procedures: No procedures performed  No notes on file     PMFS History: Patient Active Problem List   Diagnosis Date Noted  . Stroke ([place]) 10/29/2017  . Depression 10/29/2017  . Paronychia of finger of right hand  12/21/2016  . Presence of left artificial knee joint 09/04/2013  . Knee pain 11/20/2012  . Colon cancer ([place]) 07/16/2012  . Metastasis to liver ([place]) 03/09/2009   Past Medical History:  Diagnosis Date  . Cancer [facility])    2010 Colon with mets to the liver, partial colectomy, chemo, and liver met resection  . Cataract     Family History  Problem Relation Age of Onset  . Cancer Mother   . Cancer Father     Past Surgical History:  Procedure Laterality Date  . ABDOMINAL HYSTERECTOMY     due to fibroids at 35, TAH BSO  . COLON SURGERY    . JOINT REPLACEMENT     TKR x 2 on left knee  . LIVER SURGERY     Social History   Occupational History  . Not on file  Tobacco Use  . Smoking status: Former Smoker    Types: Cigarettes  . Smokeless tobacco: Former Systems developer    Quit date: 05/19/1985  Substance and Sexual Activity  . Alcohol use: Yes    Alcohol/week: 0.0 standard drinks    Comment: beer every other day  . Drug use: No  . Sexual activity: Not on file

## 2019-06-03 NOTE — Telephone Encounter (Signed)
That's fine

## 2019-06-03 NOTE — Progress Notes (Signed)
Called pt and lvm #1 

## 2019-06-06 ENCOUNTER — Encounter: Payer: Self-pay | Admitting: Family Medicine

## 2019-06-10 ENCOUNTER — Encounter: Payer: Self-pay | Admitting: Family Medicine

## 2019-06-10 MED ORDER — OXYCODONE-ACETAMINOPHEN 5-325 MG PO TABS: 1.0000 | ORAL_TABLET | Freq: Every evening | ORAL | 0 refills | Status: DC | PRN

## 2019-06-23 NOTE — Telephone Encounter (Signed)
Changed referral location to [facility] Neurology

## 2019-07-24 ENCOUNTER — Encounter: Payer: Self-pay | Admitting: Family Medicine

## 2019-07-28 ENCOUNTER — Encounter: Payer: Self-pay | Admitting: Family Medicine

## 2019-07-28 ENCOUNTER — Other Ambulatory Visit: Payer: Self-pay

## 2019-07-28 ENCOUNTER — Ambulatory Visit (INDEPENDENT_AMBULATORY_CARE_PROVIDER_SITE_OTHER): Payer: BC Managed Care – PPO | Admitting: Family Medicine

## 2019-07-28 VITALS — BP 130/84 | HR 72 | Temp 97.1°F | Ht 69.0 in | Wt 206.0 lb

## 2019-07-28 DIAGNOSIS — C787 Secondary malignant neoplasm of liver and intrahepatic bile duct: Secondary | ICD-10-CM

## 2019-07-28 DIAGNOSIS — R195 Other fecal abnormalities: Secondary | ICD-10-CM | POA: Diagnosis not present

## 2019-07-28 DIAGNOSIS — Z85038 Personal history of other malignant neoplasm of large intestine: Secondary | ICD-10-CM

## 2019-07-28 NOTE — Progress Notes (Signed)
Subjective:    Patient ID: Kathryn Zavala, female    DOB: [birth year], 64 y.o.   MRN: [id number]  HPI  Patient has a very complicated past medical history.  Patient was diagnosed with colon cancer in 2010.  She underwent a hemicolectomy per her report.  There was also metastasis to the liver.  Again per her report the individual metastasis/masses were removed surgically and then she was treated with chemotherapy.  She was reportedly told that she was cancer free after chemotherapy starting in 2013.  She was followed up annually by her oncologist but had not seen him since 2018.  She also had a total abdominal hysterectomy/bilateral salpingo-oophorectomy when she was 64 years old due to fibroids.  She states that both of her ovaries were removed at that time.  Per the patient's report she follows up with her gastroenterologist in [place] however they have not send [place] any records for me to review.  She is also uncertain of how long it has been since her last colonoscopy.  Initially she tells me that she had a colonoscopy 1 year ago.  Then she states it may have been more than 2 years.  She is unclear of how often they wanted her to get a repeat colonoscopy.  She presents today concerned because she has gained weight.  Since last year in July she has gained 18 pounds.  However she states that her diet and her exercise level do not explain the weight gain.  She reports feeling bloated.  She also reports several weeks of dark almost black watery stool.  She also reports intestinal spasms and occasional urgency to defecate.  Her stool is extremely loose and dark and this is been consistent for the last several weeks.  She denies any fevers or chills.  She denies any chest pain or shortness of breath.  She denies any abdominal pain other than bloating.  Past Medical History:  Diagnosis Date  . Cancer [facility])    2010 Colon with mets to the liver, partial colectomy, chemo, and liver met resection  .  Cataract     Past Surgical History:  Procedure Laterality Date  . ABDOMINAL HYSTERECTOMY     due to fibroids at 35, TAH BSO  . COLON SURGERY    . JOINT REPLACEMENT     TKR x 2 on left knee  . LIVER SURGERY     Current Outpatient Medications on File Prior to Visit  Medication Sig Dispense Refill  . acetaminophen (TYLENOL) 500 MG tablet Take 500 mg by mouth every 6 (six) hours as needed.    . gabapentin (NEURONTIN) 300 MG capsule 1 PO q HS, may increase to 1 PO TID if needed 90 capsule 3  . ibuprofen (ADVIL) 200 MG tablet Take 200 mg by mouth every 6 (six) hours as needed.    . baclofen (LIORESAL) 10 MG tablet Take 0.5-1 tablets (5-10 mg total) by mouth 3 (three) times daily as needed for muscle spasms. (Patient not taking: Reported on 07/28/2019) 30 each 3  . diazepam (VALIUM) 5 MG tablet 1 PO 1 hour before MRI, repeat prn (Patient not taking: Reported on 04/16/2019) 5 tablet 0  . oxyCODONE-acetaminophen (PERCOCET/ROXICET) 5-325 MG tablet Take 1-2 tablets by mouth at bedtime as needed for severe pain. (Patient not taking: Reported on 07/28/2019) 20 tablet 0   No current facility-administered medications on file prior to visit.   No Known Allergies Social History   Socioeconomic History  . Marital status: Domestic  Partner    Spouse name: Not on file  . Number of children: Not on file  . Years of education: Not on file  . Highest education level: Not on file  Occupational History  . Not on file  Tobacco Use  . Smoking status: Former Smoker    Types: Cigarettes  . Smokeless tobacco: Former Systems developer    Quit date: 05/19/1985  Substance and Sexual Activity  . Alcohol use: Yes    Alcohol/week: 0.0 standard drinks    Comment: beer every other day  . Drug use: No  . Sexual activity: Not on file  Other Topics Concern  . Not on file  Social History Narrative  . Not on file   Social Determinants of Health   Financial Resource Strain:   . Difficulty of Paying Living Expenses:   Food  Insecurity:   . Worried About Charity fundraiser in the Last Year:   . Arboriculturist in the Last Year:   Transportation Needs:   . Film/video editor (Medical):   Marland Kitchen Lack of Transportation (Non-Medical):   Physical Activity:   . Days of Exercise per Week:   . Minutes of Exercise per Session:   Stress:   . Feeling of Stress :   Social Connections:   . Frequency of Communication with Friends and Family:   . Frequency of Social Gatherings with Friends and Family:   . Attends Religious Services:   . Active Member of Clubs or Organizations:   . Attends Archivist Meetings:   Marland Kitchen Marital Status:   Intimate Partner Violence:   . Fear of Current or Ex-Partner:   . Emotionally Abused:   Marland Kitchen Physically Abused:   . Sexually Abused:    Family History  Problem Relation Age of Onset  . Cancer Mother   . Cancer Father       Review of Systems  All other systems reviewed and are negative.      Objective:   Physical Exam Vitals reviewed.  Constitutional:      General: She is not in acute distress.    Appearance: She is well-developed. She is not diaphoretic.  HENT:     Head: Normocephalic and atraumatic.     Right Ear: External ear normal.     Left Ear: External ear normal.     Nose: Nose normal.     Mouth/Throat:     Pharynx: No oropharyngeal exudate.  Eyes:     General: No scleral icterus.       Right eye: No discharge.        Left eye: No discharge.     Conjunctiva/sclera: Conjunctivae normal.     Pupils: Pupils are equal, round, and reactive to light.  Neck:     Thyroid: No thyromegaly.     Vascular: No JVD.     Trachea: No tracheal deviation.  Cardiovascular:     Rate and Rhythm: Normal rate and regular rhythm.     Heart sounds: Normal heart sounds. No murmur heard.  No friction rub. No gallop.   Pulmonary:     Effort: Pulmonary effort is normal. No respiratory distress.     Breath sounds: Normal breath sounds. No stridor. No wheezing or rales.    Chest:     Chest wall: No tenderness.  Abdominal:     General: Bowel sounds are normal. There is no distension.     Palpations: Abdomen is soft. There is no mass.  Tenderness: There is no abdominal tenderness. There is no guarding or rebound.     Hernia: No hernia is present.    Musculoskeletal:        General: No tenderness or deformity. Normal range of motion.     Cervical back: Normal range of motion and neck supple.  Lymphadenopathy:     Cervical: No cervical adenopathy.  Skin:    General: Skin is warm.     Capillary Refill: Capillary refill takes less than 2 seconds.     Findings: No erythema or rash.  Neurological:     Mental Status: She is alert and oriented to person, place, and time.     Cranial Nerves: No cranial nerve deficit.     Sensory: No sensory deficit.     Motor: No abnormal muscle tone.     Coordination: Coordination normal.     Deep Tendon Reflexes: Reflexes normal.  Psychiatric:        Behavior: Behavior normal.        Thought Content: Thought content normal.        Judgment: Judgment normal.           Assessment & Plan:  Dark stools - Plan: CBC with Differential/Platelet, COMPLETE METABOLIC PANEL WITH GFR, TSH, Fecal Globin By Immunochemistry  Patient would like to see a GI locally.  I certainly believe that this is reasonable.  However I believe that more of a work-up needs to be instituted given her history.  I will check her stool for blood.  If positive for bleeding, she would likely need an EGD as well as a colonoscopy.  I will also obtain some baseline lab work to determine if she is significantly anemic including a CBC, CMP specifically paying attention to her liver function test along with a TSH given the diarrhea and weight gain.  If there are elevations in lab work such as elevated liver function tests, given her history of metastasis, I believe the patient would benefit from a CT scan of the abdomen and pelvis and additional to the GI  consultation.  Therefore I will initially await the results of her lab work and then schedule the CT scan versus the GI consult depending upon the results

## 2019-07-29 ENCOUNTER — Other Ambulatory Visit: Payer: Self-pay | Admitting: Family Medicine

## 2019-07-29 DIAGNOSIS — R195 Other fecal abnormalities: Secondary | ICD-10-CM

## 2019-07-29 DIAGNOSIS — Z85038 Personal history of other malignant neoplasm of large intestine: Secondary | ICD-10-CM

## 2019-07-29 DIAGNOSIS — C787 Secondary malignant neoplasm of liver and intrahepatic bile duct: Secondary | ICD-10-CM

## 2019-07-29 DIAGNOSIS — Z8589 Personal history of malignant neoplasm of other organs and systems: Secondary | ICD-10-CM

## 2019-07-29 LAB — CBC WITH DIFFERENTIAL/PLATELET
Absolute Monocytes: 515 cells/uL (ref 200–950)
Basophils Absolute: 39 cells/uL (ref 0–200)
Basophils Relative: 0.5 %
Eosinophils Absolute: 140 cells/uL (ref 15–500)
Eosinophils Relative: 1.8 %
HCT: 36.8 % (ref 35.0–45.0)
Hemoglobin: 12.5 g/dL (ref 11.7–15.5)
Lymphs Abs: 2434 cells/uL (ref 850–3900)
MCH: 30.9 pg (ref 27.0–33.0)
MCHC: 34 g/dL (ref 32.0–36.0)
MCV: 90.9 fL (ref 80.0–100.0)
MPV: 10.8 fL (ref 7.5–12.5)
Monocytes Relative: 6.6 %
Neutro Abs: 4672 cells/uL (ref 1500–7800)
Neutrophils Relative %: 59.9 %
Platelets: 222 10*3/uL (ref 140–400)
RBC: 4.05 10*6/uL (ref 3.80–5.10)
RDW: 11.9 % (ref 11.0–15.0)
Total Lymphocyte: 31.2 %
WBC: 7.8 10*3/uL (ref 3.8–10.8)

## 2019-07-29 LAB — COMPLETE METABOLIC PANEL WITH GFR
AG Ratio: 1.5 (calc) (ref 1.0–2.5)
ALT: 13 U/L (ref 6–29)
AST: 18 U/L (ref 10–35)
Albumin: 4.1 g/dL (ref 3.6–5.1)
Alkaline phosphatase (APISO): 80 U/L (ref 37–153)
BUN: 11 mg/dL (ref 7–25)
CO2: 27 mmol/L (ref 20–32)
Calcium: 9.1 mg/dL (ref 8.6–10.4)
Chloride: 106 mmol/L (ref 98–110)
Creat: 0.94 mg/dL (ref 0.50–0.99)
GFR, Est African American: 74 mL/min/{1.73_m2} (ref >=60)
GFR, Est Non African American: 64 mL/min/{1.73_m2} (ref >=60)
Globulin: 2.7 g/dL (calc) (ref 1.9–3.7)
Glucose, Bld: 90 mg/dL (ref 65–99)
Potassium: 4 mmol/L (ref 3.5–5.3)
Sodium: 142 mmol/L (ref 135–146)
Total Bilirubin: 0.4 mg/dL (ref 0.2–1.2)
Total Protein: 6.8 g/dL (ref 6.1–8.1)

## 2019-07-29 LAB — TSH: TSH: 1.81 mIU/L (ref 0.40–4.50)

## 2019-07-30 ENCOUNTER — Encounter: Payer: Self-pay | Admitting: Gastroenterology

## 2019-08-07 ENCOUNTER — Ambulatory Visit (INDEPENDENT_AMBULATORY_CARE_PROVIDER_SITE_OTHER): Payer: BC Managed Care – PPO | Admitting: Neurology

## 2019-08-07 ENCOUNTER — Other Ambulatory Visit: Payer: Self-pay

## 2019-08-07 ENCOUNTER — Encounter: Payer: Self-pay | Admitting: Neurology

## 2019-08-07 ENCOUNTER — Encounter: Payer: Self-pay | Admitting: Family Medicine

## 2019-08-07 VITALS — BP 134/82 | HR 78 | Ht 69.0 in | Wt 204.5 lb

## 2019-08-07 DIAGNOSIS — R269 Unspecified abnormalities of gait and mobility: Secondary | ICD-10-CM | POA: Diagnosis not present

## 2019-08-07 DIAGNOSIS — M79605 Pain in left leg: Secondary | ICD-10-CM

## 2019-08-07 MED ORDER — DULOXETINE HCL 60 MG PO CPEP: 60.0000 mg | ORAL_CAPSULE | Freq: Every day | ORAL | 12 refills | Status: DC

## 2019-08-07 NOTE — Progress Notes (Signed)
HISTORICAL  Kathryn Zavala is a 64 year old female, seen in request by primary care physician Dr. Jenna Zavala for evaluation of left leg numbness, pain, initial evaluation was August 07, 2019.  She is accompanied by her partner at today's visit.  I reviewed and summarized the referring note. She had a history of colon cancer, was treated with surgery, chemo,  She had her first knee replacement in 2015, postsurgically, she continue complains of significant pain radiating from left knee to left shin, eventually showed dislocated of the surgical prosthetic, had a second knee replacement in September 2018, postsurgically, she continue complains of significant left knee pain, radiating to left shin, sometimes 10 out of 10, present 75% of her day, getting worse after prolonged walking,  She denies persistent numbness, weakness, she denies significant low back pain, no bowel and bladder incontinence, no right leg involvement  She drives forklift, 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  ALLERGIES: No Known Allergies  HOME MEDICATIONS: Current Outpatient Medications  Medication Sig Dispense Refill  . acetaminophen (TYLENOL) 500 MG tablet Take 500 mg by mouth every 6 (six) hours as needed.    . gabapentin (NEURONTIN) 300 MG capsule 1 PO q HS, may increase to 1 PO TID if needed 90 capsule 3  . ibuprofen (ADVIL) 200 MG tablet Take 200 mg by mouth every 6 (six) hours as needed.     No current facility-administered medications for this visit.    PAST MEDICAL HISTORY: Past Medical History:  Diagnosis Date  . Cancer [facility])    2010 Colon with mets to the liver, partial colectomy, chemo, and liver met resection  . Cataract   . Left leg pain    numbness  .  Stroke [facility])    Pt reports that she thinks she had a stroke in her late 10s, early 64s.    PAST SURGICAL HISTORY: Past Surgical History:  Procedure Laterality Date  . ABDOMINAL HYSTERECTOMY     due to fibroids at 35, TAH BSO  . COLON SURGERY    . JOINT REPLACEMENT     TKR x 2 on left knee  . LIVER SURGERY      FAMILY HISTORY: Family History  Problem Relation Age of Onset  . Cancer Mother        unsure of type  . Cancer Father        unsure of type    SOCIAL HISTORY: Social History   Socioeconomic History  . Marital status: Soil scientist    Spouse name: Not on file  . Number of children: 5  . Years of education: 49  . Highest education level: High school graduate  Occupational History  . Occupation: Patent attorney  Tobacco Use  . Smoking status: Former Smoker    Types: Cigarettes  . Smokeless tobacco: Former Systems developer    Quit date: 05/19/1985  Substance and Sexual Activity  . Alcohol use: Yes    Comment: beer every other day  . Drug use: No  . Sexual activity: Not on file  Other Topics Concern  . Not on file  Social History Narrative   Lives at home with her domestic partner, Kathryn Zavala.   Right-handed.   Two cups coffee per day.   Social Determinants of  Health   Financial Resource Strain:   . Difficulty of Paying Living Expenses:   Food Insecurity:   . Worried About Charity fundraiser in the Last Year:   . Arboriculturist in the Last Year:   Transportation Needs:   . Film/video editor (Medical):   Marland Kitchen Lack of Transportation (Non-Medical):   Physical Activity:   . Days of Exercise per Week:   . Minutes of Exercise per Session:   Stress:   . Feeling of Stress :   Social Connections:   . Frequency of Communication with Friends and Family:   . Frequency of Social Gatherings with Friends and Family:   . Attends Religious Services:   . Active Member of Clubs or Organizations:   . Attends Archivist Meetings:   Marland Kitchen Marital Status:   Intimate  Partner Violence:   . Fear of Current or Ex-Partner:   . Emotionally Abused:   Marland Kitchen Physically Abused:   . Sexually Abused:      PHYSICAL EXAM   Vitals:   08/07/19 0743  BP: 134/82  Pulse: 78  Weight: 204 lb 8 oz (92.8 kg)  Height: 5' 9"  (1.753 m)   Not recorded     Body mass index is 30.2 kg/m.  PHYSICAL EXAMNIATION:  Gen: NAD, conversant, well nourised, well groomed                     Cardiovascular: Regular rate rhythm, no peripheral edema, warm, nontender. Eyes: Conjunctivae clear without exudates or hemorrhage Neck: Supple, no carotid bruits. Pulmonary: Clear to auscultation bilaterally   NEUROLOGICAL EXAM:  MENTAL STATUS: Speech:    Speech is normal; fluent and spontaneous with normal comprehension.  Cognition:     Orientation to time, place and person     Normal recent and remote memory     Normal Attention span and concentration     Normal Language, naming, repeating,spontaneous speech     Fund of knowledge   CRANIAL NERVES: CN II: Visual fields are full to confrontation. Pupils are round equal and briskly reactive to light. CN III, IV, VI: extraocular movement are normal. No ptosis. CN V: Facial sensation is intact to light touch CN VII: Face is symmetric with normal eye closure  CN VIII: Hearing is normal to causal conversation. CN IX, X: Phonation is normal. CN XI: Head turning and shoulder shrug are intact  MOTOR: There is no pronator drift of out-stretched arms. Muscle bulk and tone are normal. Muscle strength is normal.  REFLEXES: Reflexes are 2+ and symmetric at the biceps, triceps, knees, and ankles. Plantar responses are flexor.  SENSORY: Intact to light touch, pinprick and vibratory sensation are intact in fingers and toes.  Left shin pain, with palpation  COORDINATION: There is no trunk or limb dysmetria noted.  GAIT/STANCE: Antalgic, dragging left leg   DIAGNOSTIC DATA (LABS, IMAGING, TESTING) - I reviewed patient records, labs,  notes, testing and imaging myself where available.   ASSESSMENT AND PLAN  Kathryn Zavala is a 64 y.o. female    History of left knee replacement twice Chronic left knee pain, radiating pain to left shin before and following to left knee replacement, progressively worse,  Mostly likely related to her previous left knee issues,  EMG nerve conduction study to rule out left lower extremity neuropathy   Marcial Pacas, M.D. Ph.D.  [facility] [address], [place], [place] [address] Ph: [phone] Fax: [phone]  CC:  Susy Frizzle, MD

## 2019-08-13 ENCOUNTER — Encounter: Payer: Self-pay | Admitting: *Deleted

## 2019-08-13 ENCOUNTER — Encounter: Payer: Self-pay | Admitting: Family Medicine

## 2019-08-13 ENCOUNTER — Ambulatory Visit
Admission: RE | Admit: 2019-08-13 | Discharge: 2019-08-13 | Disposition: A | Discharge status: Home / Self Care | Payer: BC Managed Care – PPO | Source: Ambulatory Visit | Attending: Family Medicine | Admitting: Family Medicine

## 2019-08-13 DIAGNOSIS — R195 Other fecal abnormalities: Secondary | ICD-10-CM

## 2019-08-13 DIAGNOSIS — C787 Secondary malignant neoplasm of liver and intrahepatic bile duct: Secondary | ICD-10-CM

## 2019-08-13 DIAGNOSIS — K7689 Other specified diseases of liver: Secondary | ICD-10-CM | POA: Diagnosis not present

## 2019-08-13 DIAGNOSIS — Z8589 Personal history of malignant neoplasm of other organs and systems: Secondary | ICD-10-CM

## 2019-08-13 DIAGNOSIS — Z85038 Personal history of other malignant neoplasm of large intestine: Secondary | ICD-10-CM

## 2019-08-13 IMAGING — CT CT ABD-PELV W/ CM
1 of 3 series · 12 of 32 positions shown, 17 images · IV contrast (APPLIED)
Comparison: No priors.

CLINICAL DATA: 63-year-old female with history of colon cancer with
metastatic disease to the liver. Constipation for 1 week. Lower
abdominal pain. Blood in stool. Nausea and vomiting.

EXAM:
CT ABDOMEN AND PELVIS WITH CONTRAST
TECHNIQUE: Multidetector CT imaging of the abdomen and pelvis was performed
using the standard protocol following bolus administration of
intravenous contrast.
CONTRAST:  100mL [MT] IOPAMIDOL ([MT]) INJECTION 61%

[Series 2: abd/pelvis w/cm · axial · 0.87mm/px · z∈[+212,+608]mm · 12 of 93 slices shown, 17 images]
[im 7/93  soft-tissue]
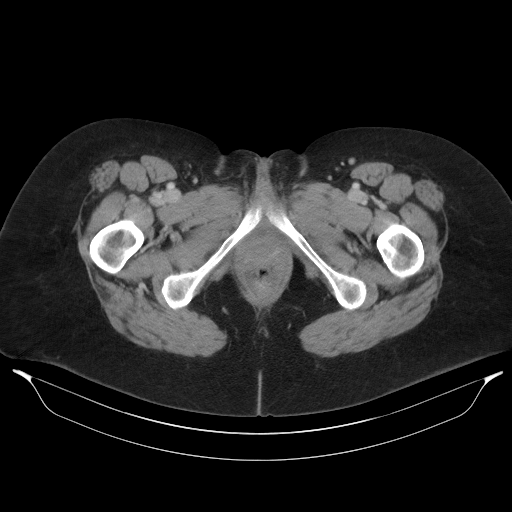
[im 7/93  bone]
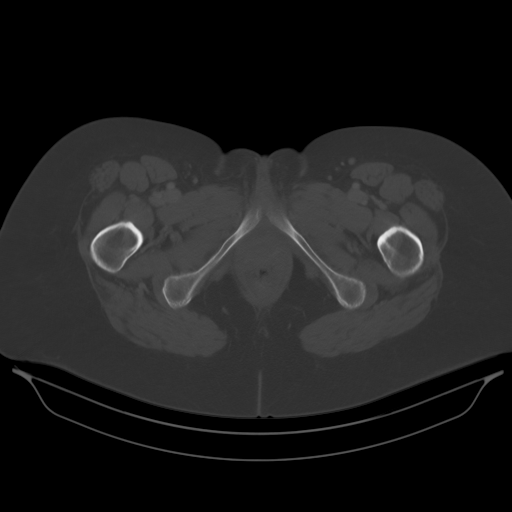
[im 13/93  soft-tissue]
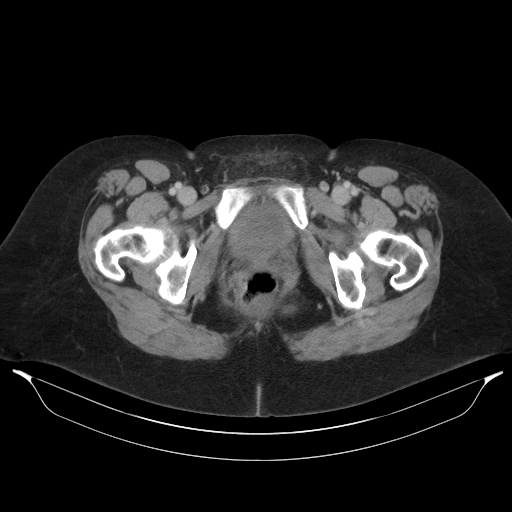
[im 25/93  soft-tissue]
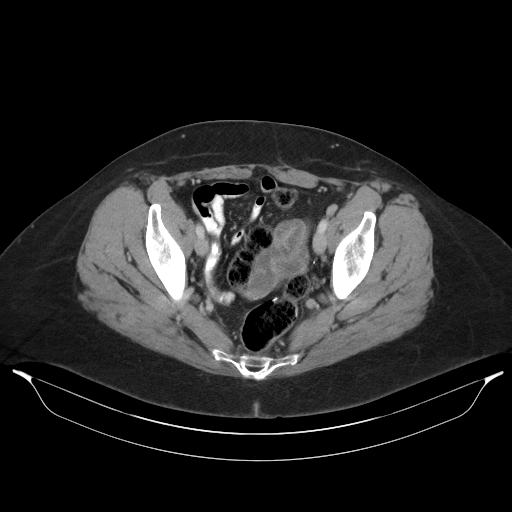
[im 31/93  soft-tissue]
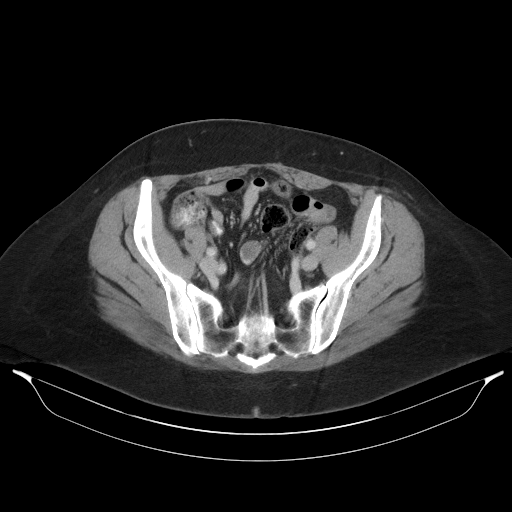
[im 37/93  soft-tissue]
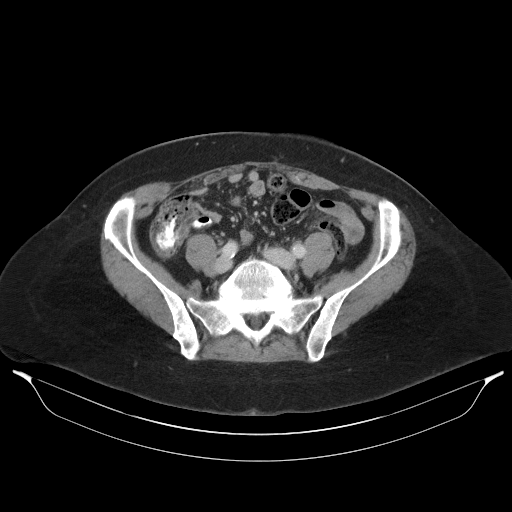
[im 50/93  soft-tissue]
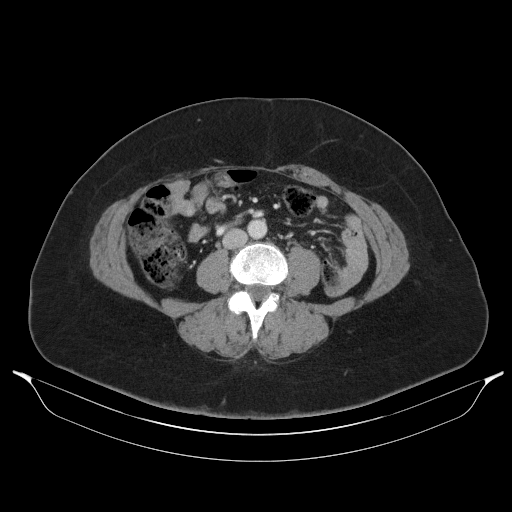
[im 56/93  soft-tissue]
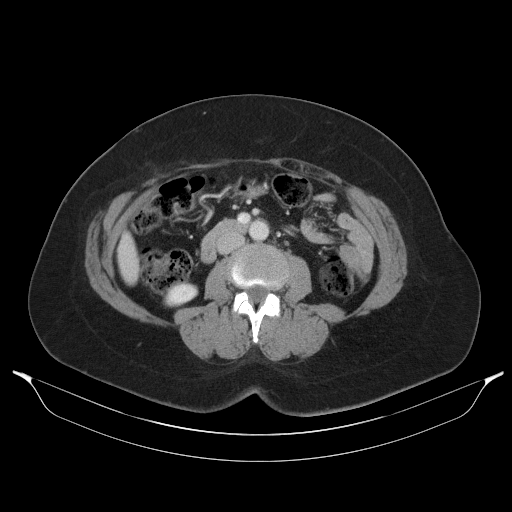
[im 62/93  soft-tissue]
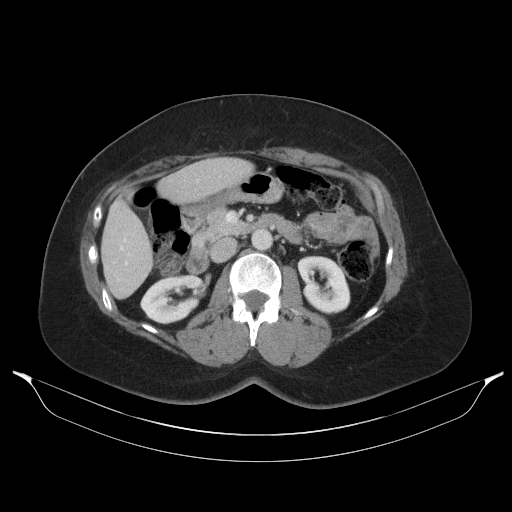
[im 68/93  soft-tissue]
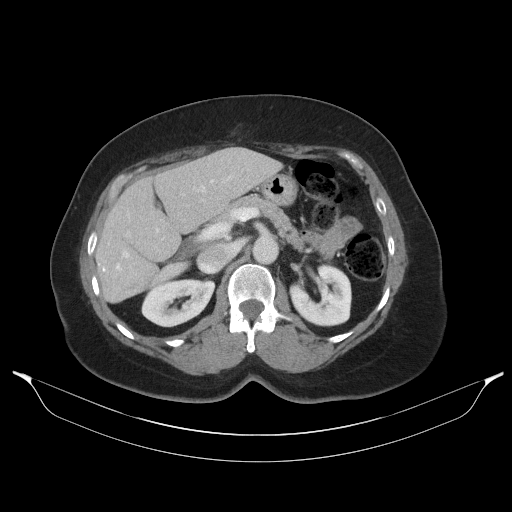
[im 68/93  lung]
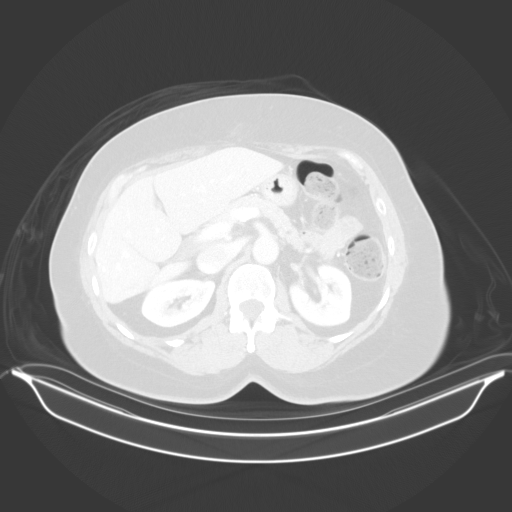
[im 68/93  bone]
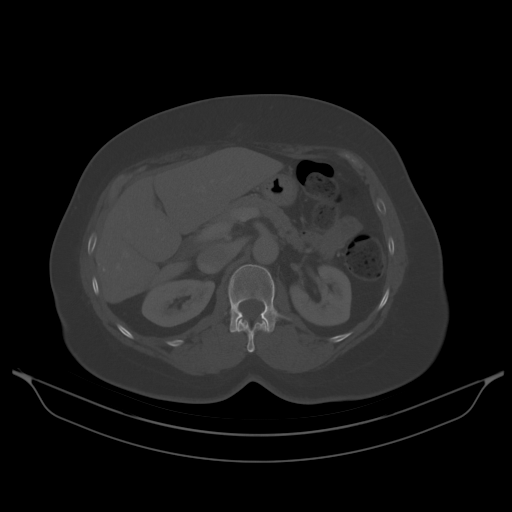
[im 74/93  lung]
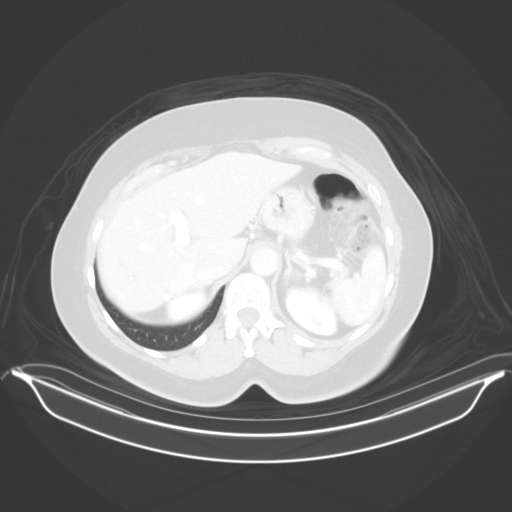
[im 80/93  soft-tissue]
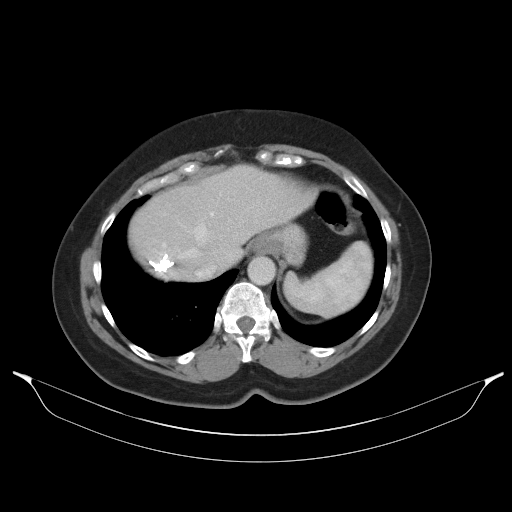
[im 80/93  lung]
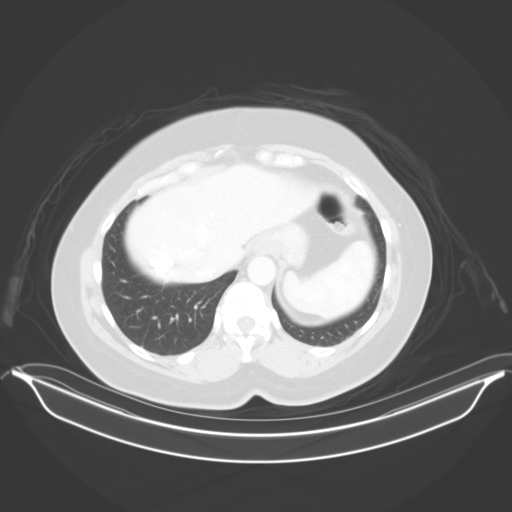
[im 86/93  soft-tissue]
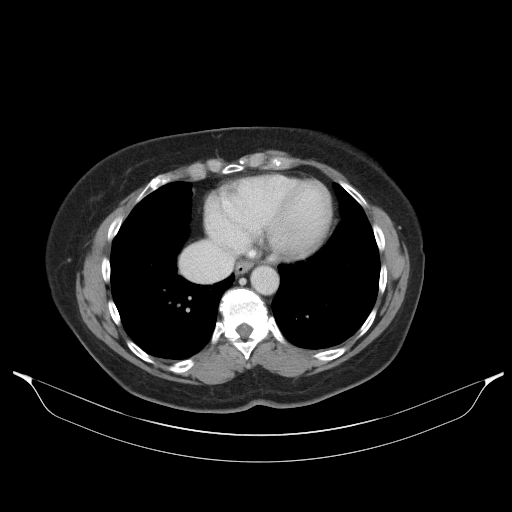
[im 86/93  lung]
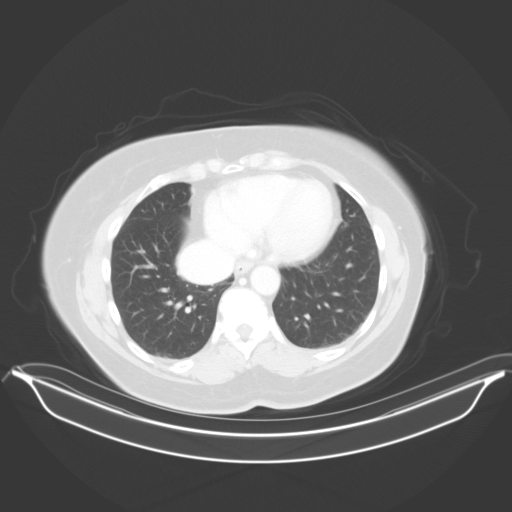

[12 of 32 positions shown; findings below may reference images not displayed]

FINDINGS: Lower chest: Multiple small pulmonary nodules measuring 4 mm or less
in size scattered throughout the lung bases bilaterally. High
attenuation structure in the right atrium imaged only on the first
image of the examination, presumably a central venous catheter.

Hepatobiliary: Multiple surgical clips noted throughout the right
lobe of the liver, presumably from prior surgical resection of
metastatic disease. Subcentimeter low-attenuation lesion in segment
2 of the liver, too small to characterize, but statistically likely
to represent a tiny cyst. Ill-defined 1.4 x 0.6 cm hypovascular area
in segment 8 of the liver (axial image 19 of series 2), poorly
characterized, potentially an area of postoperative scarring,
although an underlying lesion is difficult to entirely exclude. No
intra or extrahepatic biliary ductal dilatation. Status post
cholecystectomy.

Pancreas: No pancreatic mass. No pancreatic ductal dilatation. No
pancreatic or peripancreatic fluid collections or inflammatory
changes.

Spleen: Unremarkable.

Adrenals/Urinary Tract: Subcentimeter low-attenuation lesions in the
left kidney are too small to characterize, but statistically likely
to represent tiny cysts. Right kidney and bilateral adrenal glands
are normal in appearance. No hydroureteronephrosis. Urinary bladder
is completely decompressed, but otherwise unremarkable in
appearance.

Stomach/Bowel: Normal appearance of the stomach. No pathologic
dilatation of small bowel or colon. The appendix is not confidently
identified and may be surgically absent. Regardless, there are no
inflammatory changes noted adjacent to the cecum to suggest the
presence of an acute appendicitis at this time. Suture line in the
sigmoid colon, presumably from prior partial colectomy. No
unexpected soft tissue mass associated with the suture line to
suggest local recurrence of disease.

Vascular/Lymphatic: Aortic atherosclerosis, without evidence of
aneurysm or dissection in the abdominal or pelvic vasculature. No
lymphadenopathy noted in the abdomen or pelvis.

Reproductive: Status post hysterectomy. Ovaries are not confidently
identified may be surgically absent or atrophic.

Other: No significant volume of ascites.  No pneumoperitoneum.

Musculoskeletal: There are no aggressive appearing lytic or blastic
lesions noted in the visualized portions of the skeleton.
IMPRESSION: 1. No acute findings are noted in the abdomen or pelvis to account
for the patient's symptoms.
2. Status post partial colectomy in the sigmoid colon and resection
of hepatic metastases. No findings to suggest local recurrence of
disease. There is an indeterminate area in segment 8 of the liver.
This may simply represent an area of postoperative scarring,
however, close attention on follow-up studies is recommended to
exclude the possibility of a metastatic lesion. Follow-up evaluation
with abdominal MRI with and without IV gadolinium could be
considered, however, given the large number of surgical clips
throughout the right lobe of the liver, the possibility of a limited
examination from imaging artifact needs to be considered.
3. Multiple tiny pulmonary nodules throughout the visualize lung
bases measuring 4 mm or less in size. Although some of these appear
similar to prior chest CT from [DATE], others appear to be new.
The possibility of metastatic disease to the lungs should be
considered. Further evaluation with noncontrast chest CT is
recommended to more completely evaluate the lungs and establish a
baseline for future follow-up imaging.

## 2019-08-13 MED ORDER — IOPAMIDOL (ISOVUE-300) INJECTION 61%
100.0000 mL | Freq: Once | INTRAVENOUS | Status: AC | PRN
  Administered 2019-08-13: 100 mL via INTRAVENOUS

## 2019-08-14 ENCOUNTER — Encounter: Payer: Self-pay | Admitting: Family Medicine

## 2019-08-14 ENCOUNTER — Telehealth: Payer: Self-pay | Admitting: *Deleted

## 2019-08-14 DIAGNOSIS — R195 Other fecal abnormalities: Secondary | ICD-10-CM

## 2019-08-14 DIAGNOSIS — R918 Other nonspecific abnormal finding of lung field: Secondary | ICD-10-CM

## 2019-08-14 NOTE — Telephone Encounter (Signed)
I tried the patient one more time and she answered. She is no longer taking gabapentin. She had been using 300mg , up to four capsules daily but felt it did not work well. She is interested in trying Lyrica 50mg , one capsule TID and Mobic 15mg , one tablet daily. She understands that Lyrica replaces gabapentin and that ibuprofen should not be using simultaneously with Mobic. Prescriptions have been sent to Dr. Krista Blue for authorization.

## 2019-08-14 NOTE — Addendum Note (Signed)
Addended by: Desmond Lope on: 08/14/2019 05:25 PM   Modules accepted: Orders

## 2019-08-14 NOTE — Telephone Encounter (Addendum)
From Dr. Krista Blue:  She was given Cymbalta 60 mg daily following her initial visit on August 07, 2019,  Please call patient -  Other option would be higher dose of gabapentin, if her symptoms respond to gabapentin some or switch to Lyrica 50 mg 3 times a day. May be in combination with Mobic 7.5 mg, as needed.

## 2019-08-14 NOTE — Telephone Encounter (Signed)
She was given Cymbalta 60 mg daily following her initial visit on August 07, 2019,  Please call patient Other option would be higher dose of gabapentin, if her symptoms does respond to gabapentin some,  switch to Lyrica 50 mg 3 times a day, And in combination with Mobic 7.5 mg as needed

## 2019-08-14 NOTE — Telephone Encounter (Signed)
Left message requesting a call back from the patient. 

## 2019-08-14 NOTE — Telephone Encounter (Signed)
Email from patient:  The medication you prescribed during my last visit made me very ill, I threw up had diarrhea red blisters and dots over my face and arms. I only took the medication once on Thursday night and finally beginning to feel better I think I've had an allergic reaction to this medication please indicate that in my folder and I'm going to discontinue the medication. If you have any further medical information please text me in my chart.

## 2019-08-14 NOTE — Telephone Encounter (Addendum)
Left second message for a return call to discuss the medication options below.  We need to confirm how she is taking gabapentin and  ask if it helps at all - may can increase.  If she would like to change to Lyrica, she will need to stop gabapentin.  If she is interested in meloxicam, then she will need to be informed to not take ibuprofen in combination with it.

## 2019-08-15 MED ORDER — PREGABALIN 50 MG PO CAPS: 50.0000 mg | ORAL_CAPSULE | Freq: Three times a day (TID) | ORAL | 5 refills | Status: DC

## 2019-08-15 MED ORDER — MELOXICAM 15 MG PO TABS: 15.0000 mg | ORAL_TABLET | Freq: Every day | ORAL | 5 refills | Status: DC

## 2019-08-15 NOTE — Addendum Note (Signed)
Addended by: Sheral Flow on: 08/15/2019 08:17 AM   Modules accepted: Orders

## 2019-08-15 NOTE — Addendum Note (Signed)
Addended by: Marcial Pacas on: 08/15/2019 10:20 AM   Modules accepted: Orders

## 2019-08-28 ENCOUNTER — Other Ambulatory Visit: Payer: BC Managed Care – PPO

## 2019-08-29 ENCOUNTER — Ambulatory Visit
Admission: RE | Admit: 2019-08-29 | Discharge: 2019-08-29 | Disposition: A | Discharge status: Home / Self Care | Payer: BC Managed Care – PPO | Source: Ambulatory Visit | Attending: Family Medicine | Admitting: Family Medicine

## 2019-08-29 DIAGNOSIS — R918 Other nonspecific abnormal finding of lung field: Secondary | ICD-10-CM

## 2019-08-29 IMAGING — CT CT CHEST W/ CM
2 of 4 series · 14 of 36 positions shown, 17 images · IV contrast (iopamidol)
Comparison: [DATE] abdomen/pelvis CT and [DATE] chest CT

CLINICAL DATA: 64-year-old female for follow-up of pulmonary
nodules identified on recent abdominopelvic CT.

EXAM:
CT CHEST WITH CONTRAST
TECHNIQUE: Multidetector CT imaging of the chest was performed during
intravenous contrast administration.
CONTRAST:  75mL [I5] IOPAMIDOL ([I5]) INJECTION 61%

[Series 2: chest 2.00 br40 s3 · axial · 0.61mm/px · z∈[+1442,+1726]mm · 11 of 168 slices shown, 14 images (1 of 2)]
[im 13/168  mediastinal]
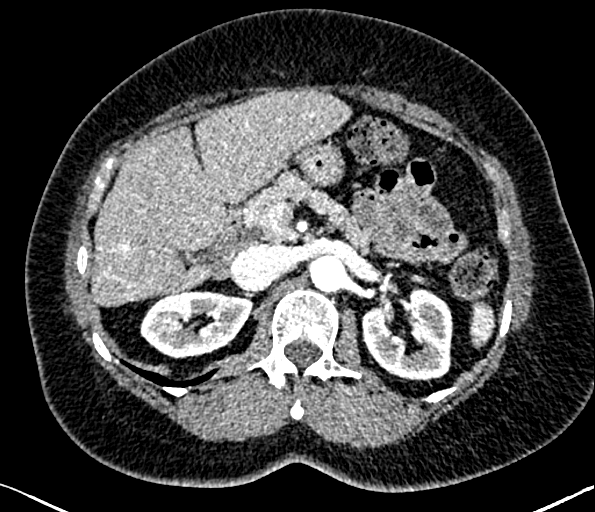
[im 13/168  lung]
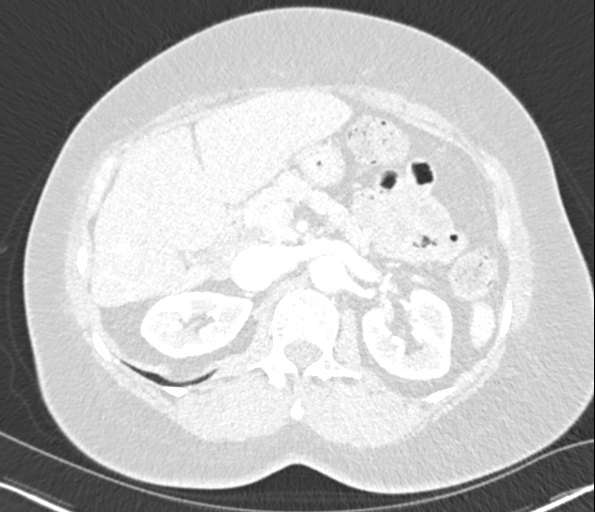
[im 26/168  lung]
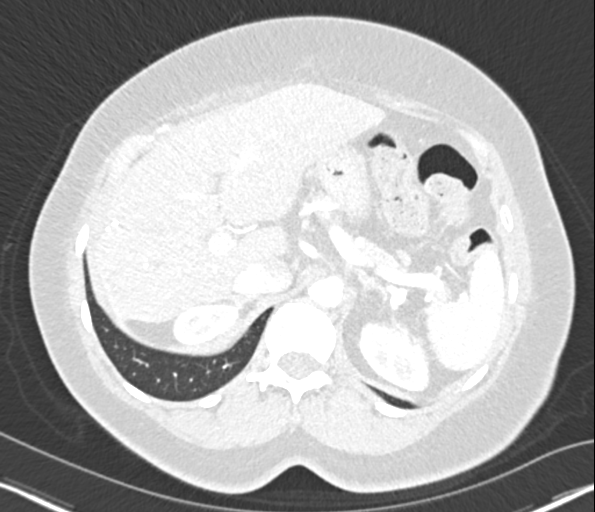
[im 39/168  lung]
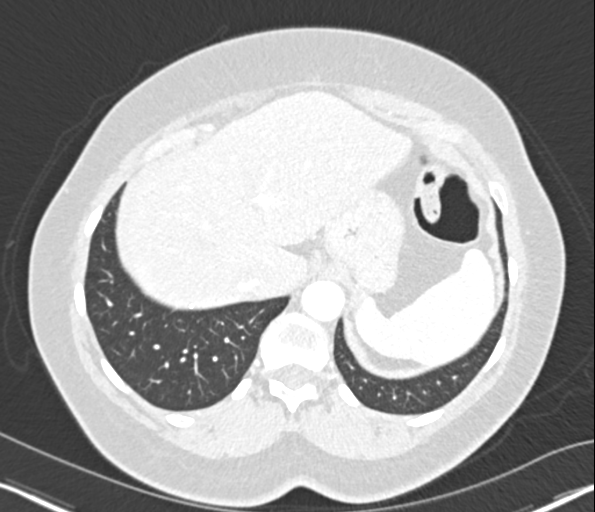
[im 52/168  lung]
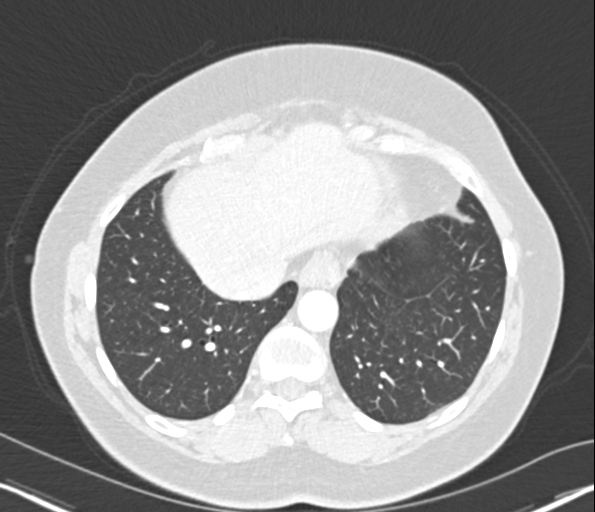
[im 65/168  mediastinal]
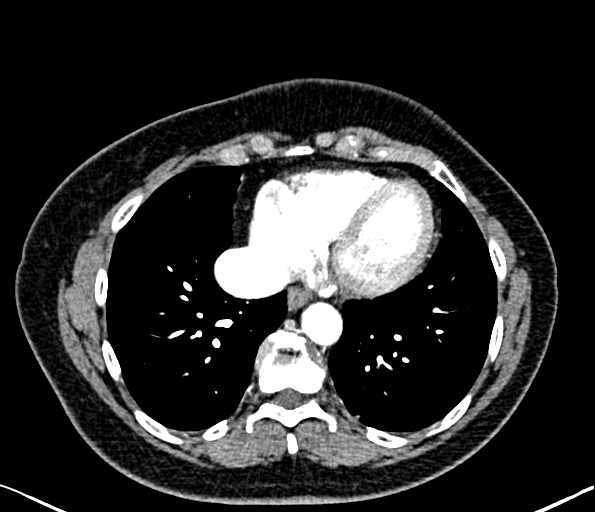
[im 65/168  lung]
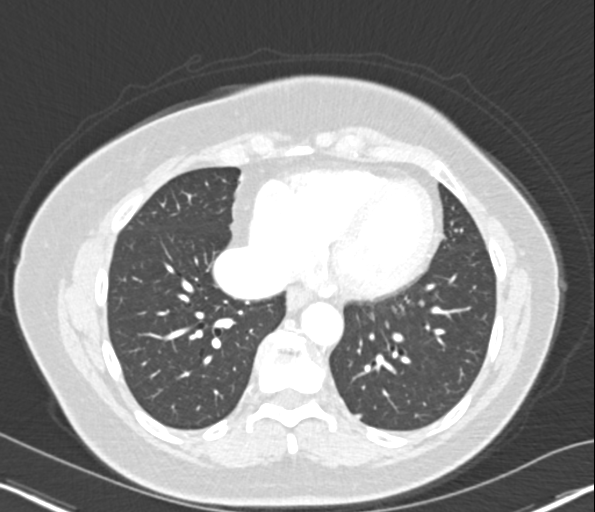
[im 90/168  lung]
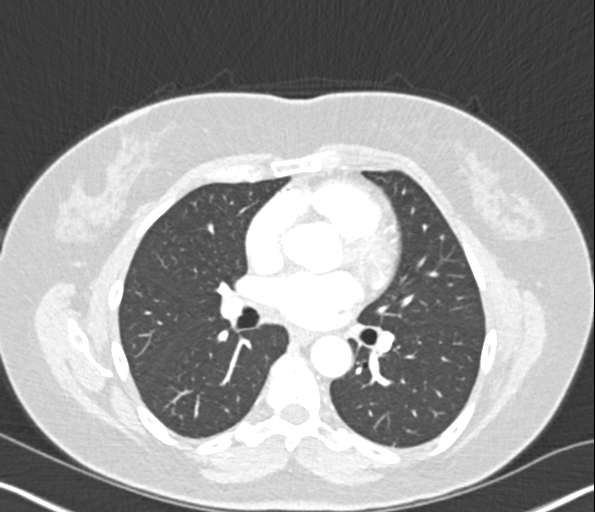
[im 103/168  lung]
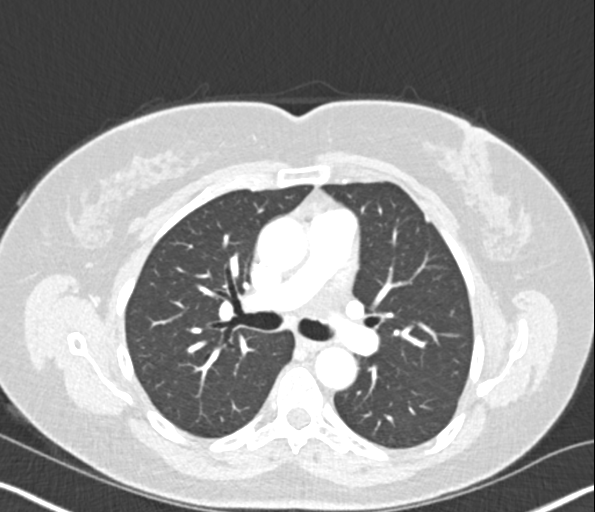
[im 116/168  lung]
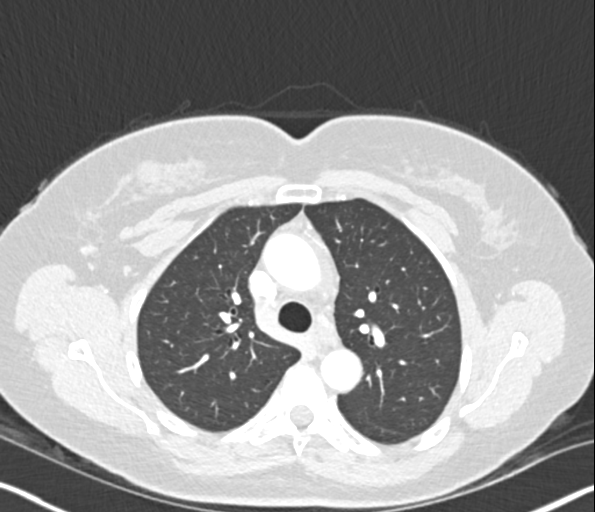
[im 129/168  mediastinal]
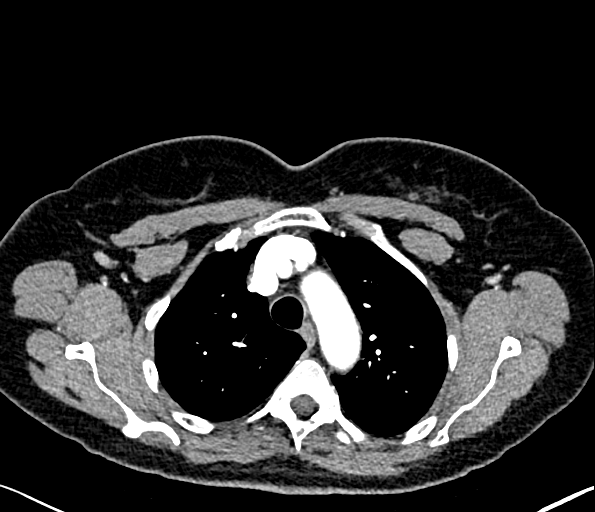
[im 129/168  lung]
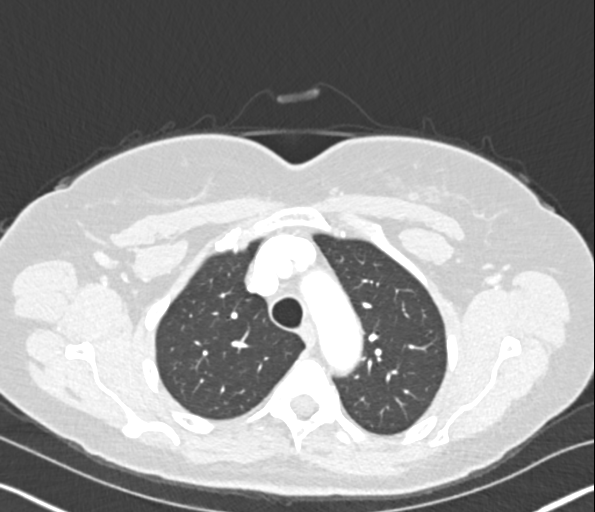
[im 142/168  lung]
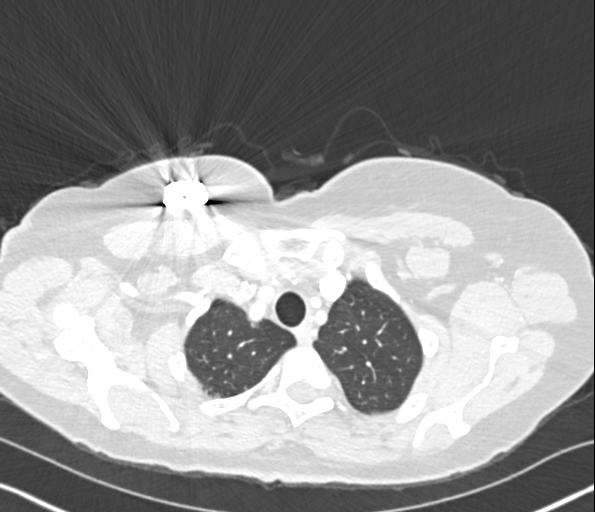
[im 155/168  lung]
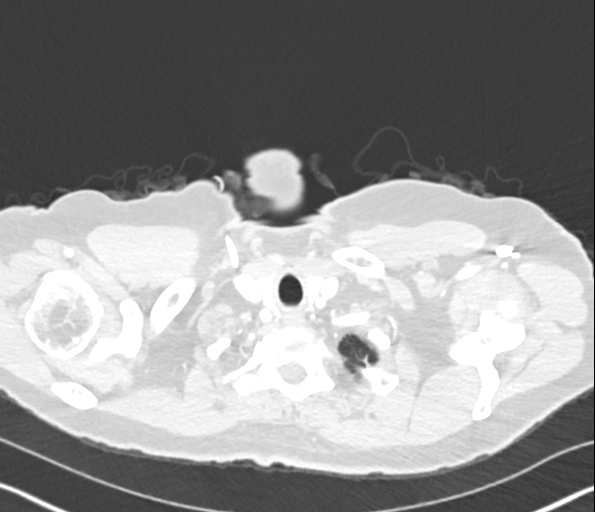

[Series 4: chest 2.00 br40 s3 · coronal · 0.66mm/px · 3 of 155 slices shown (2 of 2)]
[im 31/155  lung]
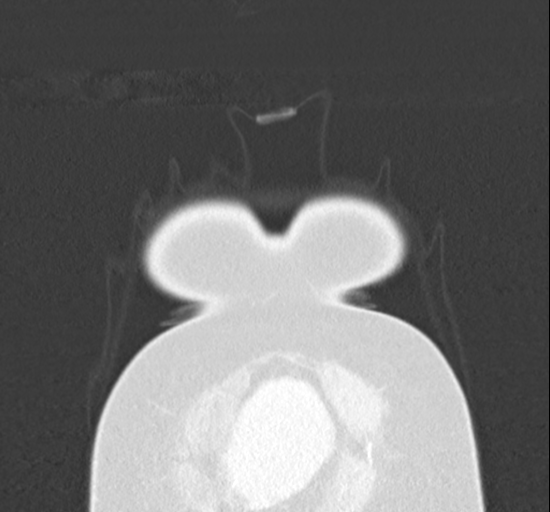
[im 62/155  lung]
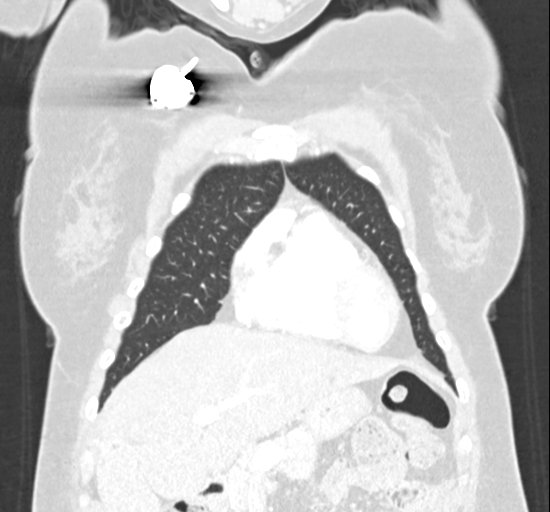
[im 93/155  lung]
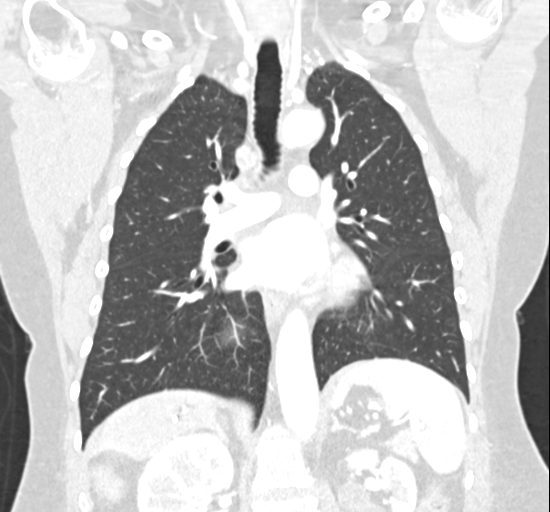

[14 of 36 positions shown; findings below may reference images not displayed]

FINDINGS: Cardiovascular: Heart size is normal. There is no evidence of
thoracic aortic aneurysm or pericardial effusion. No vascular
abnormalities are identified. A RIGHT Port-A-Cath is identified with
tip at the SUPERIOR cavoatrial junction.

Mediastinum/Nodes: No enlarged mediastinal, hilar, or axillary lymph
nodes. Thyroid gland, trachea, and esophagus demonstrate no
significant findings.

Lungs/Pleura: There are scattered 2-4 mm bilateral pulmonary nodules
(primarily subpleural) within both lungs, all unchanged from [I5]
and considered benign. No new or enlarging nodule, mass, airspace
disease, consolidation, pleural effusion or pneumothorax identified.

Upper Abdomen: No acute abnormality.

Musculoskeletal: No acute or suspicious bony abnormalities are
identified.
IMPRESSION: 1. Scattered tiny bilateral pulmonary nodules, unchanged from [I5]
and considered benign. No further imaging follow-up recommended.
2. No other significant abnormalities.

## 2019-08-29 MED ORDER — IOPAMIDOL (ISOVUE-300) INJECTION 61%
75.0000 mL | Freq: Once | INTRAVENOUS | Status: AC | PRN
  Administered 2019-08-29: 75 mL via INTRAVENOUS

## 2019-09-01 ENCOUNTER — Telehealth: Payer: Self-pay

## 2019-09-01 NOTE — Telephone Encounter (Signed)
Kathryn Zavala asked if you had any other recommendation on to why she has a  lose of smell?

## 2019-09-02 NOTE — Telephone Encounter (Signed)
Not sure, do not remember discussing that with her. Be glad to see her to discuss.

## 2019-09-02 NOTE — Telephone Encounter (Signed)
Pt has appt to come and discuss this issue with her provider.

## 2019-09-04 ENCOUNTER — Other Ambulatory Visit: Payer: Self-pay

## 2019-09-04 ENCOUNTER — Ambulatory Visit: Payer: BC Managed Care – PPO | Admitting: Family Medicine

## 2019-09-10 ENCOUNTER — Ambulatory Visit (INDEPENDENT_AMBULATORY_CARE_PROVIDER_SITE_OTHER): Payer: BC Managed Care – PPO | Admitting: Neurology

## 2019-09-10 ENCOUNTER — Encounter: Payer: Self-pay | Admitting: Neurology

## 2019-09-10 ENCOUNTER — Encounter (INDEPENDENT_AMBULATORY_CARE_PROVIDER_SITE_OTHER): Payer: BC Managed Care – PPO | Admitting: Neurology

## 2019-09-10 ENCOUNTER — Other Ambulatory Visit: Payer: Self-pay

## 2019-09-10 DIAGNOSIS — M79605 Pain in left leg: Secondary | ICD-10-CM

## 2019-09-10 DIAGNOSIS — Z0289 Encounter for other administrative examinations: Secondary | ICD-10-CM

## 2019-09-10 DIAGNOSIS — R269 Unspecified abnormalities of gait and mobility: Secondary | ICD-10-CM

## 2019-09-10 NOTE — Procedures (Signed)
        Full Name: [facility] Gender: Female MRN #: [id number] Date of Birth: Nov 15, 1955    Visit Date: 09/10/2019 08:08 Age: 64 Years Examining Physician: Marcial Pacas, MD  Referring Physician: Marcial Pacas, MD Height: 5 feet 9 inch History: 64 year old female history of left knee replacement with persistent left shin pain  Summary of the test:  Nerve conduction study:  Left sural, superficial peroneal sensory responses were normal.  Left peroneal to EDB, tibial motor responses were normal.  Electromyography: Selected needle examination of left lower extremity muscles were normal.  Conclusion: This is a normal study.  There is no electrodiagnostic evidence of left lower extremity neuropathy.    ------------------------------- Marcial Pacas, M.D. PhD [facility] [address] [place], [place] [address] Tel: [phone] Fax: [phone]  Verbal informed consent was obtained from the patient, patient was informed of potential risk of procedure, including bruising, bleeding, hematoma formation, infection, muscle weakness, muscle pain, numbness, among others.        [place]    Nerve / Sites Muscle Latency Ref. Amplitude Ref. Rel Amp Segments Distance Velocity Ref. Area    ms ms mV mV %  cm m/s m/s mVms  L Peroneal - EDB     Ankle EDB 4.1 ?6.5 5.1 ?2.0 100 Ankle - EDB 9   13.7     Fib head EDB 10.3  4.0  79.5 Fib head - Ankle 28 46 ?44 11.2     Pop fossa EDB 12.3  4.3  107 Pop fossa - Fib head 10 50 ?44 12.2         Pop fossa - Ankle      L Tibial - AH     Ankle AH 5.0 ?5.8 5.2 ?4.0 100 Ankle - AH 9   7.4     Pop fossa AH 13.5  4.5  86 Pop fossa - Ankle 39 46 ?41 8.4         SNC    Nerve / Sites Rec. Site Peak Lat Ref.  Amp Ref. Segments Distance    ms ms V V  cm  L Sural - Ankle (Calf)     Calf Ankle 4.1 ?4.4 7 ?6 Calf - Ankle 14  L Superficial peroneal - Ankle     Lat leg Ankle 4.0 ?4.4 6 ?6 Lat leg - Ankle 14         F  Wave      Nerve F Lat Ref.   ms ms  L Tibial - AH 55.3 ?56.0       EMG Summary Table    Spontaneous MUAP Recruitment  Muscle IA Fib PSW Fasc Other Amp Dur. Poly Pattern  L. Gastrocnemius (Medial head) Normal None None None _______ Normal Normal Normal Normal  L. Tibialis anterior Normal None None None _______ Normal Normal Normal Normal  L. Tibialis posterior Normal None None None _______ Normal Normal Normal Normal  L. Peroneus longus Normal None None None _______ Normal Normal Normal Normal  L. Vastus lateralis Normal None None None _______ Normal Normal Normal Normal

## 2019-09-15 ENCOUNTER — Telehealth: Payer: Self-pay | Admitting: *Deleted

## 2019-09-15 NOTE — Telephone Encounter (Signed)
-----   Message from [place], LPN sent at 07/07/4429  9:49 AM EDT -----  ----- Message ----- From: Susy Frizzle, MD Sent: 09/11/2019   6:26 AM EDT To: Saundra Shelling, CMA  There is no evidence of any nerve damage or merto explain her left leg pain on the test doen at the neurologist.  ----- Message ----- From: Marcial Pacas, MD Sent: 09/10/2019  10:07 AM EDT To: Susy Frizzle, MD

## 2019-09-15 NOTE — Telephone Encounter (Signed)
Call placed to patient. LMTRC.  

## 2019-09-17 NOTE — Telephone Encounter (Signed)
Patient aware per MyChart.   

## 2019-09-24 ENCOUNTER — Ambulatory Visit (INDEPENDENT_AMBULATORY_CARE_PROVIDER_SITE_OTHER): Payer: BC Managed Care – PPO | Admitting: Gastroenterology

## 2019-09-24 ENCOUNTER — Encounter: Payer: Self-pay | Admitting: Gastroenterology

## 2019-09-24 ENCOUNTER — Other Ambulatory Visit (INDEPENDENT_AMBULATORY_CARE_PROVIDER_SITE_OTHER): Payer: BC Managed Care – PPO

## 2019-09-24 VITALS — BP 120/70 | HR 70 | Ht 69.0 in | Wt 205.0 lb

## 2019-09-24 DIAGNOSIS — R194 Change in bowel habit: Secondary | ICD-10-CM

## 2019-09-24 DIAGNOSIS — R932 Abnormal findings on diagnostic imaging of liver and biliary tract: Secondary | ICD-10-CM

## 2019-09-24 LAB — BASIC METABOLIC PANEL
BUN: 14 mg/dL (ref 6–23)
CO2: 30 mEq/L (ref 19–32)
Calcium: 9.1 mg/dL (ref 8.4–10.5)
Chloride: 105 mEq/L (ref 96–112)
Creatinine, Ser: 0.87 mg/dL (ref 0.40–1.20)
GFR: 79.05 mL/min (ref >60.00)
Glucose, Bld: 70 mg/dL (ref 70–99)
Potassium: 3.7 mEq/L (ref 3.5–5.1)
Sodium: 141 mEq/L (ref 135–145)

## 2019-09-24 MED ORDER — DIAZEPAM 5 MG PO TABS: ORAL_TABLET | ORAL | 0 refills | Status: DC

## 2019-09-24 MED ORDER — NA SULFATE-K SULFATE-MG SULF 17.5-3.13-1.6 GM/177ML PO SOLN: 1.0000 | Freq: Once | ORAL | 0 refills | Status: AC

## 2019-09-24 NOTE — Patient Instructions (Addendum)
I have recommended an MRI for further evaluation of the liver lesion seen on CT scan.  The MRI may be limited due to the surgical clips in this area but is probably the best test for further evaluation.  I will review records from your prior oncologist.  I suspect we will need to have you see an oncologist within the [facility] health system.  I will let you know after I reviewed those records.  For your constipation I recommend following a high-fiber diet with fresh fruits, vegetables, and bran fibers.  In addition we know foods such as kiwi, applesauce, bran fiber, and prune juice can effectively help constipation.    You should drink at least 64 ounces of water daily.  Regular exercise can improve bowel regularity.  Given the severity of your symptoms I recommend taking MiraLAX 17 g every morning.  You may increase this dose to twice daily if no improvement after 1 week.  I have also recommended a colonoscopy.  Given your history of colon cancer your son should start colon cancer screening at age 59 or earlier if he has any symptoms.  It was a pleasure to meet you today.  I look forward to working with you to get your bowel habits more normal.  You have been scheduled for an MRI at [facility] (1st floor radiology) on Tuesday 09/30/19. Your appointment time is 7 am. Please arrive 15 minutes prior to your appointment time for registration purposes. Please make certain not to have anything to eat or drink 6 hours prior to your test. In addition, if you have any metal in your body, have a pacemaker or defibrillator, please be sure to let your ordering physician know. This test typically takes 45 minutes to 1 hour to complete. Should you need to reschedule, please call [phone] to do so.

## 2019-09-24 NOTE — Progress Notes (Signed)
Referring Provider: Susy Frizzle, MD Primary Care Physician:  Susy Frizzle, MD  Reason for Consultation:  Constipation   IMPRESSION:  Change in bowel habits     - stools now dark, rectal pain with defecation, BM not more than weekly, sense of incomplete evacuation Personal history of stage IV colon cancer Abnormal liver lesion on recent CT scan  Colonoscopy recommended. Will plan anorectal manometry if colonoscopy is non-diagnostic. In the meantime, start using Miralax daily instead of on a PRN dosing schedule.   MRI recommended to follow-up on the abnormal findings on CT scan.  As she is now living here, I have recommended that she establish care with an oncologist in [place].    PLAN: - Colonoscopy with Suprep - Miralax 17 gram daily - Drink at least 64 ounces of water, follow a high fiber diet - Discussed foods that can be used to manage constipation - MRI of the liver with and without gadolinium - Children should start colon cancer screening at age 96 - Referral to oncology to establish care for follow-up  Please see the "Patient Instructions" section for addition details about the plan.  HPI: Kathryn Zavala is a 64 y.o. female referred by Dr. Dennard Schaumann for constipation. The history is obtained through the patient and review of her electronic health record including [facility].  Stage IV colon cancer diagnosed in 2010. Colon cancer resection 02/2008 followed by liver resection for metastatic disease 01/06/09.  Treated with chemotherapy 2010-2012.  Last available information from 2014 notes that she was "disease free." Had a colonoscopy in 2014 but I am unable to locate those results. She feels like she had one 2 years ago, but, I do not see these records in [facility]. Last CT scan 07/08/15 showed no evidence for recurrent or metastatic disease.  Two month history of change in bowel habits. Feels like stools are dark.  Having a bowel movement weekly.   Rectal pain with defecation. Tearing sensation with straining. Blood noted in the stool and on the paper once. Manual assistance with defecation on one occasion.  Sense of incomplete evacuation.  Baseline bowel habits are one formed stool every 2-3 days.   Miralax helped for a couple of days but then would lose it's effect. She has not tired other medications or dietary changes.   She has not tolerated bowel prep in the past.   Labs 2014: CEA <0.50 Labs 07/28/19: normal CMP and CBC and TSH 1.81  No known family history of colon cancer or polyps. No family history of uterine/endometrial cancer, pancreatic cancer or gastric/stomach cancer.   Past Medical History:  Diagnosis Date  . Cancer [facility])    2010 Colon with mets to the liver, partial colectomy, chemo, and liver met resection  . Cataract   . Left leg pain    numbness  . Stroke [facility])    Pt reports that she thinks she had a stroke in her late 69s, early 70s.    Past Surgical History:  Procedure Laterality Date  . ABDOMINAL HYSTERECTOMY     due to fibroids at 35, [facility] BSO  . COLON SURGERY    . COLONOSCOPY    . JOINT REPLACEMENT     TKR x 2 on left knee  . LIVER SURGERY      Current Outpatient Medications  Medication Sig Dispense Refill  . acetaminophen (TYLENOL) 500 MG tablet Take 500 mg by mouth every 6 (six) hours as needed.    . meloxicam (MOBIC) 15 MG  tablet Take 1 tablet (15 mg total) by mouth daily. 30 tablet 5  . pregabalin (LYRICA) 50 MG capsule Take 1 capsule (50 mg total) by mouth 3 (three) times daily. 90 capsule 5   No current facility-administered medications for this visit.    Allergies as of 09/24/2019 - Review Complete 09/24/2019  Allergen Reaction Noted  . Duloxetine Diarrhea, Nausea And Vomiting, and Other (See Comments) 08/13/2019    Family History  Problem Relation Age of Onset  . Cancer Mother        unsure of type  . Cancer Father        unsure of type    Social History   Socioeconomic  History  . Marital status: Soil scientist    Spouse name: Not on file  . Number of children: 5  . Years of education: 64  . Highest education level: High school graduate  Occupational History  . Occupation: Patent attorney  Tobacco Use  . Smoking status: Former Smoker    Types: Cigarettes  . Smokeless tobacco: Never Used  . Tobacco comment: high school days  Vaping Use  . Vaping Use: Never used  Substance and Sexual Activity  . Alcohol use: Yes    Comment: beer every other day  . Drug use: No  . Sexual activity: Not on file  Other Topics Concern  . Not on file  Social History Narrative   Lives at home with her domestic partner, Peter Congo.   Right-handed.   Two cups coffee per day.   Social Determinants of Health   Financial Resource Strain:   . Difficulty of Paying Living Expenses:   Food Insecurity:   . Worried About Charity fundraiser in the Last Year:   . Arboriculturist in the Last Year:   Transportation Needs:   . Film/video editor (Medical):   Marland Kitchen Lack of Transportation (Non-Medical):   Physical Activity:   . Days of Exercise per Week:   . Minutes of Exercise per Session:   Stress:   . Feeling of Stress :   Social Connections:   . Frequency of Communication with Friends and Family:   . Frequency of Social Gatherings with Friends and Family:   . Attends Religious Services:   . Active Member of Clubs or Organizations:   . Attends Archivist Meetings:   Marland Kitchen Marital Status:   Intimate Partner Violence:   . Fear of Current or Ex-Partner:   . Emotionally Abused:   Marland Kitchen Physically Abused:   . Sexually Abused:       Physical Exam: General:   Alert,  well-nourished, pleasant and cooperative in NAD Head:  Normocephalic and atraumatic. Eyes:  Sclera clear, no icterus.   Conjunctiva pink. Ears:  Normal auditory acuity. Nose:  No deformity, discharge,  or lesions. Mouth:  No deformity or lesions.   Neck:  Supple; no masses or thyromegaly. Lungs:   Clear throughout to auscultation.   No wheezes. Heart:  Regular rate and rhythm; no murmurs. Abdomen:  Soft, nontender, nondistended, normal bowel sounds, no rebound or guarding. No hepatosplenomegaly.   Rectal:  Deferred  Msk:  Symmetrical. No boney deformities LAD: No inguinal or umbilical LAD Extremities:  No clubbing or edema. Neurologic:  Alert and  oriented x4;  grossly nonfocal Skin:  Intact without significant lesions or rashes. Psych:  Alert and cooperative. Normal mood and affect.     Kathryn Zavala L. Tarri Glenn, MD, MPH 09/24/2019, 10:21 AM

## 2019-09-29 MED ORDER — DIAZEPAM 5 MG PO TABS: ORAL_TABLET | ORAL | 0 refills | Status: DC

## 2019-09-30 ENCOUNTER — Other Ambulatory Visit: Payer: Self-pay

## 2019-09-30 ENCOUNTER — Ambulatory Visit (HOSPITAL_COMMUNITY)
Admission: RE | Admit: 2019-09-30 | Discharge: 2019-09-30 | Disposition: A | Discharge status: Home / Self Care | Payer: BC Managed Care – PPO | Source: Ambulatory Visit | Attending: Gastroenterology | Admitting: Gastroenterology

## 2019-09-30 DIAGNOSIS — C787 Secondary malignant neoplasm of liver and intrahepatic bile duct: Secondary | ICD-10-CM | POA: Diagnosis not present

## 2019-09-30 DIAGNOSIS — Z9049 Acquired absence of other specified parts of digestive tract: Secondary | ICD-10-CM | POA: Diagnosis not present

## 2019-09-30 DIAGNOSIS — K7689 Other specified diseases of liver: Secondary | ICD-10-CM | POA: Diagnosis not present

## 2019-09-30 DIAGNOSIS — R932 Abnormal findings on diagnostic imaging of liver and biliary tract: Secondary | ICD-10-CM | POA: Insufficient documentation

## 2019-09-30 DIAGNOSIS — Z85038 Personal history of other malignant neoplasm of large intestine: Secondary | ICD-10-CM

## 2019-09-30 DIAGNOSIS — C189 Malignant neoplasm of colon, unspecified: Secondary | ICD-10-CM | POA: Diagnosis not present

## 2019-09-30 IMAGING — MR MR ABDOMEN WO/W CM
18 of 19 series · 47 of 48 positions shown · IV contrast (gadavist)
Comparison: [DATE]

CLINICAL DATA: Colon cancer metastatic to the liver, prior
resection of the sigmoid colon and resection of hepatic metastatic
lesions with indeterminate potential lesion in segment 8 of the
liver on CT scan of [DATE], for further assessment

EXAM:
MRI ABDOMEN WITHOUT AND WITH CONTRAST
TECHNIQUE: Multiplanar multisequence MR imaging of the abdomen was performed
both before and after the administration of intravenous contrast.
CONTRAST:  9mL GADAVIST GADOBUTROL 1 MMOL/ML IV SOLN

[Series 3: T2 · coronal · 6.0mm · 1.56mm/px · 1 of 35 slices shown (1 of 2)]
[im 1/35]
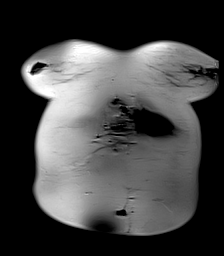

[Series 4: T2 fat-sat · axial · 6.0mm · 1.25mm/px · 1 of 38 slices shown]
[im 1/38]
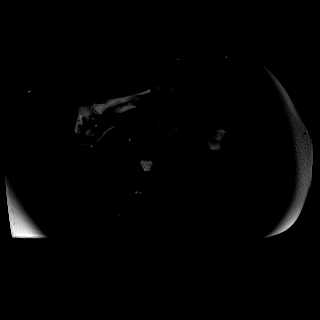

[Series 6: T1 · axial · 3.0mm · 1.25mm/px · z∈[-201,+36]mm · 3 of 80 slices shown (1 of 2)]
[im 1/80]
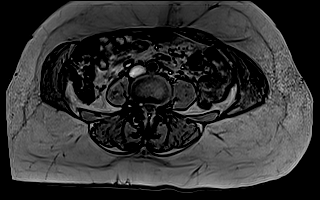
[im 40/80]
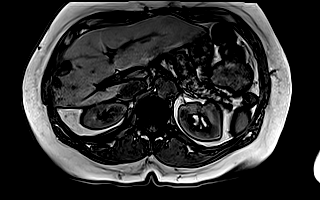
[im 80/80]
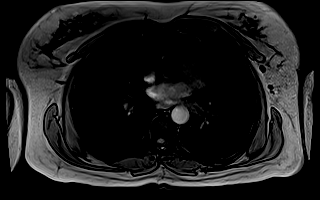

[Series 7: T1 · axial · 3.0mm · 1.25mm/px · z∈[-201,+36]mm · 3 of 80 slices shown (2 of 2)]
[im 1/80]
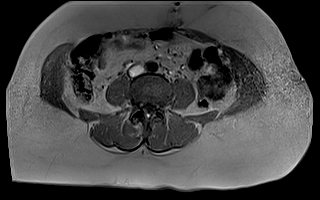
[im 40/80]
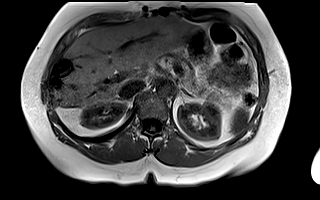
[im 80/80]
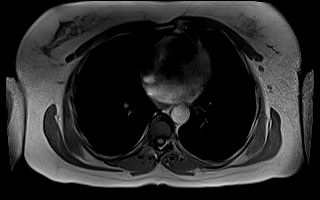

[Series 8: DWI · axial · 6.0mm · 1.49mm/px · z∈[-209,+57]mm · 3 of 76 slices shown (1 of 2)]
[im 1/76]
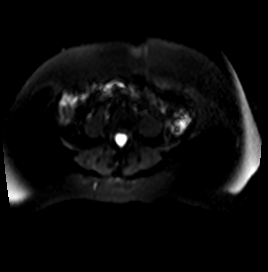
[im 38/76]
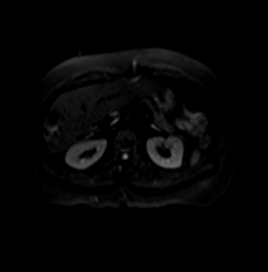
[im 76/76]
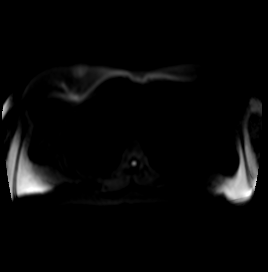

[Series 9: DWI · axial · 6.0mm · 1.49mm/px · 1 of 38 slices shown (2 of 2)]
[im 1/38]
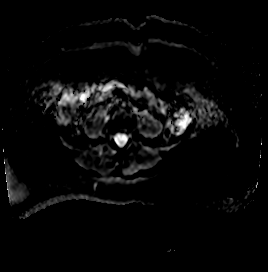

[Series 10: bSSFP · axial · 4.0mm · 0.84mm/px · z∈[-217,+59]mm · 3 of 70 slices shown]
[im 1/70]
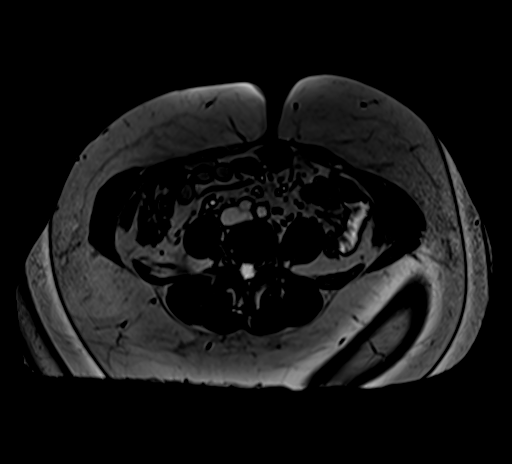
[im 35/70]
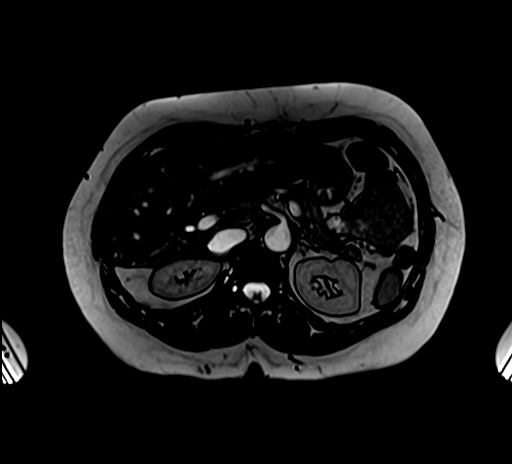
[im 70/70]
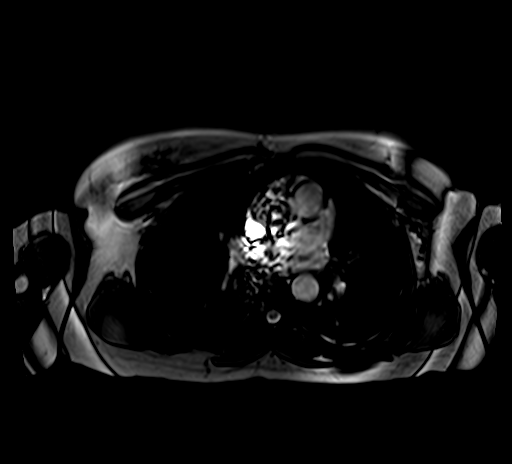

[Series 12: T1 dynamic · axial · 3.0mm · 1.25mm/px · z∈[-203,+34]mm · 3 of 80 slices shown (1 of 6)]
[im 1/80]
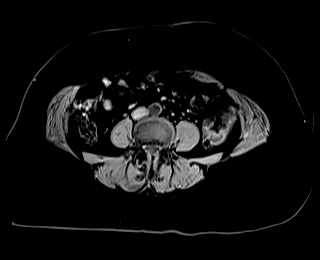
[im 40/80]
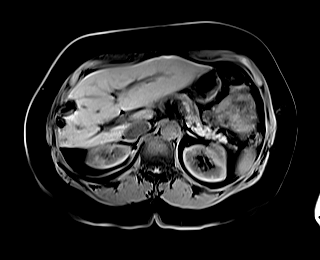
[im 80/80]
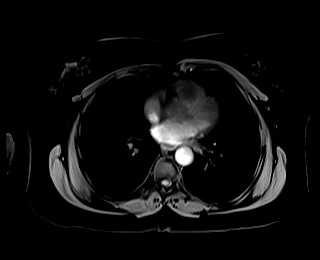

[Series 15: T1 dynamic · axial · 3.0mm · 1.25mm/px · z∈[-203,+34]mm · 3 of 80 slices shown (2 of 6)]
[im 1/80]
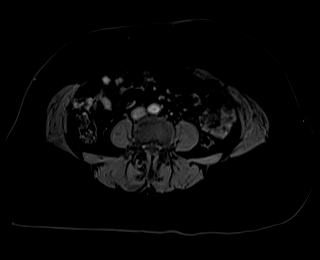
[im 40/80]
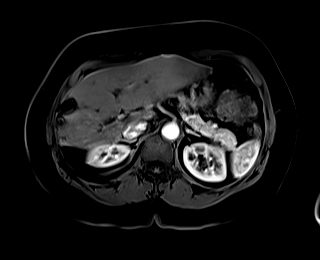
[im 80/80]
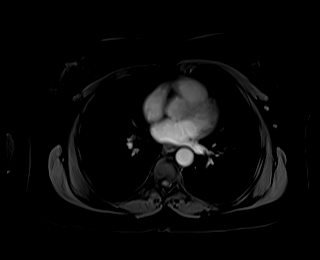

[Series 17: T1 dynamic · axial · 3.0mm · 1.25mm/px · z∈[-203,+34]mm · 3 of 80 slices shown (3 of 6)]
[im 1/80]
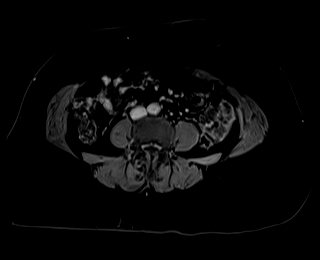
[im 40/80]
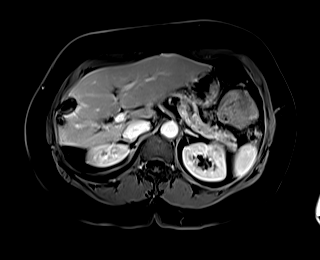
[im 80/80]
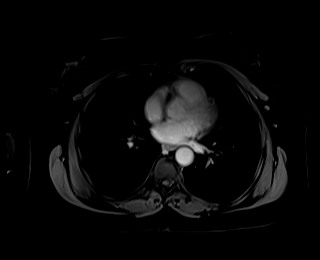

[Series 19: T1 dynamic · axial · 3.0mm · 1.25mm/px · z∈[-203,+34]mm · 3 of 80 slices shown (4 of 6)]
[im 1/80]
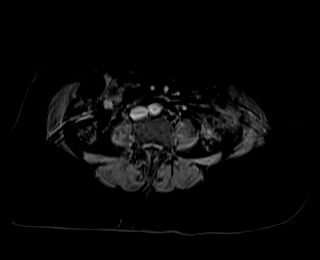
[im 40/80]
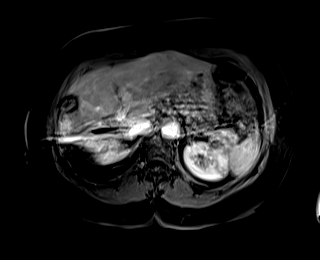
[im 80/80]
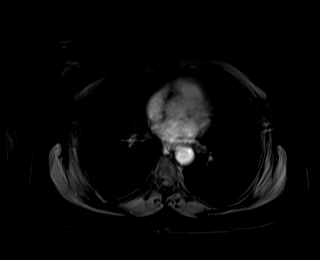

[Series 21: T1 dynamic · coronal · 5.0mm · 1.56mm/px · 3 of 72 slices shown (5 of 6)]
[im 1/72]
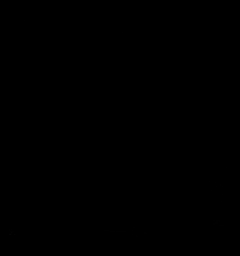
[im 36/72]
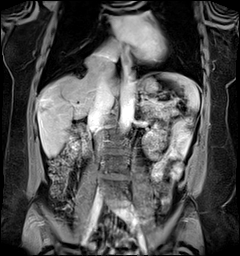
[im 72/72]
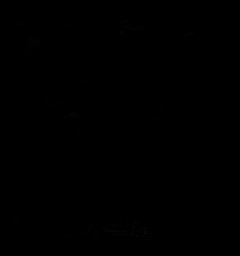

[Series 22: T2 · axial · 6.0mm · 1.56mm/px · z∈[-212,+68]mm · 2 of 40 slices shown (2 of 2)]
[im 1/40]
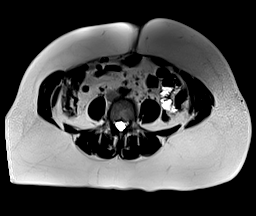
[im 40/40]
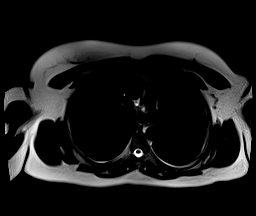

[Series 24: T1 dynamic · axial · 3.0mm · 1.25mm/px · z∈[-203,+34]mm · 3 of 80 slices shown (6 of 6)]
[im 1/80]
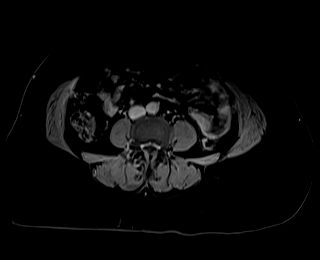
[im 40/80]
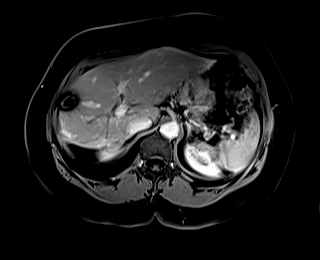
[im 80/80]
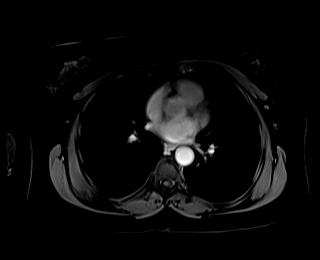

[Series 100: sub_20 sec · axial · 3.0mm · 1.25mm/px · z∈[-203,+34]mm · 3 of 80 slices shown]
[im 1/80]
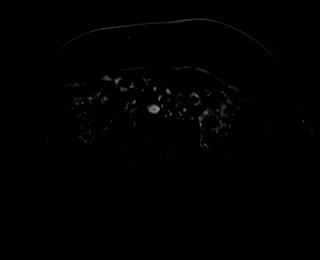
[im 40/80]
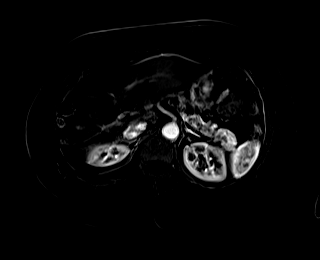
[im 80/80]
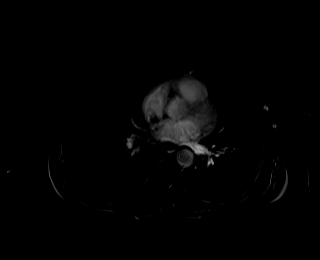

[Series 101: sub_(id) · axial · 3.0mm · 1.25mm/px · z∈[-203,+34]mm · 3 of 80 slices shown]
[im 1/80]
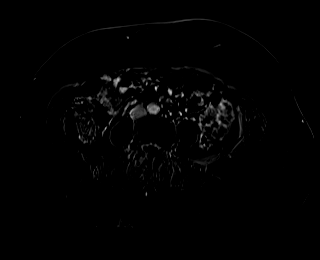
[im 40/80]
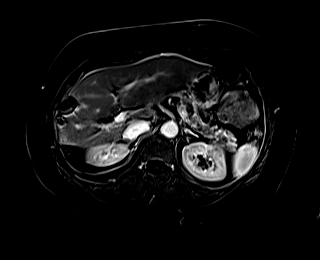
[im 80/80]
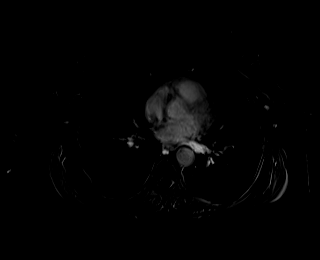

[Series 102: sub_90 sec · axial · 3.0mm · 1.25mm/px · z∈[-203,+34]mm · 3 of 80 slices shown]
[im 1/80]
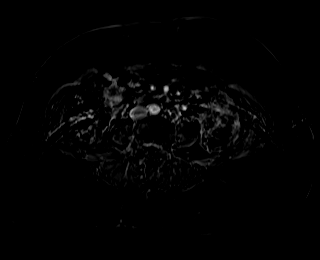
[im 40/80]
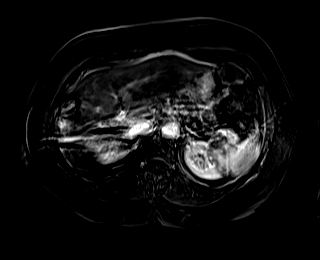
[im 80/80]
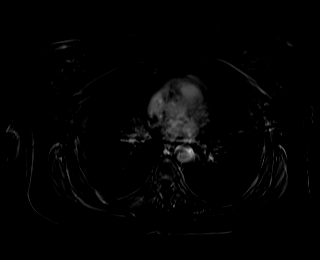

[Series 103: sub_delay · axial · 3.0mm · 1.25mm/px · z∈[-203,+34]mm · 3 of 80 slices shown]
[im 1/80]
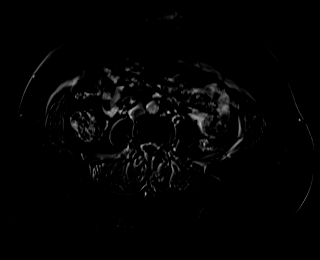
[im 40/80]
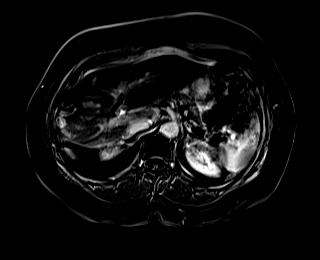
[im 80/80]
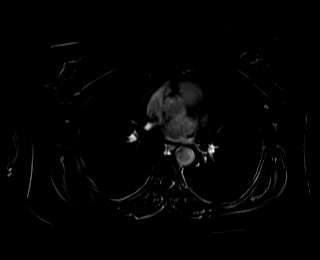

[47 of 48 positions shown; findings below may reference images not displayed]

FINDINGS: Lower chest: Right Port-A-Cath noted.

Hepatobiliary: Postoperative findings within and along the liver
including metal artifact from clips.

In the region of concern in the right hepatic lobe where there was
vague bandlike hypodensity on recent CT abdomen, we demonstrate no
significant abnormal restriction of diffusion and no specific
worrisome enhancement pattern characteristic of a metastatic lesion.
There is some distortion in the vicinity with confluence of vessels
probably related to prior resection

Tiny suspected cyst in the lateral segment left hepatic lobe
posteriorly on image 28/17. No significant biliary dilatation.

Gallbladder absent.

Pancreas:  Unremarkable

Spleen:  Unremarkable

Adrenals/Urinary Tract:  Unremarkable

Stomach/Bowel: Unremarkable

Vascular/Lymphatic:  Unremarkable

Other:  No supplemental non-categorized findings.

Musculoskeletal: Unremarkable
IMPRESSION: 1. No findings of active malignancy involving the liver. The area of
concern in the right hepatic lobe seen on recent CT scan is not
readily apparent on today's MRI. There is some distortion in the
vicinity with confluence of vessels probably related to prior
resection. Most recent CEA level of [DATE] was low. Overall I
consider the MRI appearance highly reassuring, and given that the
patient's colon cancer was originally diagnosed and treated fairly
remotely (in [34] with chemotherapy through [34]), no further
imaging follow up is considered to be required at this point
assuming that of other clinical indicators remain benign.

## 2019-09-30 MED ORDER — GADOBUTROL 1 MMOL/ML IV SOLN
9.0000 mL | Freq: Once | INTRAVENOUS | Status: AC | PRN
  Administered 2019-09-30: 9 mL via INTRAVENOUS

## 2019-10-01 ENCOUNTER — Telehealth: Payer: Self-pay | Admitting: Nurse Practitioner

## 2019-10-01 NOTE — Telephone Encounter (Signed)
Received a new pt referral from Dr. Tarri Glenn at [facility] for hx of colon cancer. Kathryn Zavala has been scheduled to see Lacie on 9/9 at 1:45pm. I cld and lft the appt date and time on the pt's vm. Letter mailed.

## 2019-10-02 ENCOUNTER — Encounter: Payer: Self-pay | Admitting: Gastroenterology

## 2019-10-02 ENCOUNTER — Ambulatory Visit (AMBULATORY_SURGERY_CENTER): Payer: BC Managed Care – PPO | Admitting: Gastroenterology

## 2019-10-02 ENCOUNTER — Other Ambulatory Visit: Payer: Self-pay

## 2019-10-02 VITALS — BP 102/58 | HR 61 | Temp 97.8°F | Resp 14 | Ht 69.0 in | Wt 205.0 lb

## 2019-10-02 DIAGNOSIS — K635 Polyp of colon: Secondary | ICD-10-CM | POA: Diagnosis not present

## 2019-10-02 DIAGNOSIS — K621 Rectal polyp: Secondary | ICD-10-CM | POA: Diagnosis not present

## 2019-10-02 DIAGNOSIS — R194 Change in bowel habit: Secondary | ICD-10-CM

## 2019-10-02 DIAGNOSIS — R932 Abnormal findings on diagnostic imaging of liver and biliary tract: Secondary | ICD-10-CM

## 2019-10-02 DIAGNOSIS — Z85038 Personal history of other malignant neoplasm of large intestine: Secondary | ICD-10-CM | POA: Diagnosis not present

## 2019-10-02 DIAGNOSIS — Z1211 Encounter for screening for malignant neoplasm of colon: Secondary | ICD-10-CM | POA: Diagnosis not present

## 2019-10-02 DIAGNOSIS — D125 Benign neoplasm of sigmoid colon: Secondary | ICD-10-CM

## 2019-10-02 MED ORDER — FLEET ENEMA 7-19 GM/118ML RE ENEM
1.0000 | ENEMA | Freq: Once | RECTAL | Status: AC
  Administered 2019-10-02: 1 via RECTAL

## 2019-10-02 MED ORDER — SODIUM CHLORIDE 0.9 % IV SOLN: 500.0000 mL | Freq: Once | INTRAVENOUS | Status: DC

## 2019-10-02 NOTE — Progress Notes (Signed)
A and O x3. Report to RN. Tolerated MAC anesthesia well.

## 2019-10-02 NOTE — Op Note (Signed)
[facility] Patient Name: [facility] Kathryn Zavala Procedure Date: 10/02/2019 11:18 AM MRN: [id number] Endoscopist: Thornton Park MD, MD Age: 64 Referring MD:  Date of Birth: 06-20-1955 Gender: Female Account #: [id number] Procedure:                Colonoscopy Indications:              Change in bowel habits                           Personal history of stage IV colon cancer Medicines:                Monitored Anesthesia Care Procedure:                Pre-Anesthesia Assessment:                           - Prior to the procedure, a History and Physical                            was performed, and patient medications and                            allergies were reviewed. The patient's tolerance of                            previous anesthesia was also reviewed. The risks                            and benefits of the procedure and the sedation                            options and risks were discussed with the patient.                            All questions were answered, and informed consent                            was obtained. Prior Anticoagulants: The patient has                            taken no previous anticoagulant or antiplatelet                            agents. ASA Grade Assessment: II - A patient with                            mild systemic disease. After reviewing the risks                            and benefits, the patient was deemed in                            satisfactory condition to undergo the procedure.  After obtaining informed consent, the colonoscope                            was passed under direct vision. Throughout the                            procedure, the patient's blood pressure, pulse, and                            oxygen saturations were monitored continuously. The                            Colonoscope was introduced through the anus and                            advanced to the the cecum, identified  by                            appendiceal orifice and ileocecal valve. The                            colonoscopy was performed with moderate difficulty                            due to poor endoscopic visualization. Successful                            completion of the procedure was aided by lavage.                            The patient tolerated the procedure well. The                            quality of the bowel preparation was inadequate.                            The ileocecal valve, appendiceal orifice, and                            rectum were photographed. Scope In: 11:21:47 AM Scope Out: 11:36:59 AM Scope Withdrawal Time: 0 hours 10 minutes 14 seconds  Total Procedure Duration: 0 hours 15 minutes 12 seconds  Findings:                 The perianal and digital rectal examinations were                            normal.                           Two sessile polyps were found in the sigmoid colon.                            The polyps were 2 to 3 mm in size. These polyps  were removed with a cold snare. Resection and                            retrieval were complete. Estimated blood loss was                            minimal.                           The colon (entire examined portion) appeared normal                            except for mild erythema in the rectum. Biopsies                            for histology were taken with a cold forceps from                            the entire colon for evaluation of microscopic                            colitis. Biospies from the rectum were obtained                            separately. Estimated blood loss was minimal.                           Stool was found in the entire colon limiting an                            evaluation for small or medium sized polyps.                           The exam was otherwise without abnormality on                            direct and retroflexion  views. Complications:            No immediate complications. Estimated blood loss:                            Minimal. Estimated Blood Loss:     Estimated blood loss was minimal. Impression:               - Preparation of the colon was inadequate.                           - Two 2 to 3 mm polyps in the sigmoid colon,                            removed with a cold snare. Resected and retrieved.                           - The entire examined colon is normal. Biopsied.                           -  Stool in the entire examined colon.                           - The examination was otherwise normal on direct                            and retroflexion views. Recommendation:           - Patient has a contact number available for                            emergencies. The signs and symptoms of potential                            delayed complications were discussed with the                            patient. Return to normal activities tomorrow.                            Written discharge instructions were provided to the                            patient.                           - Resume previous diet.                           - Continue present medications.                           - Add a daily dose of Metamucil, Benefiber, or                            Citrucel.                           - Await pathology results.                           - Repeat colonoscopy in 1 year because the bowel                            preparation was poor. Two day prep at that time.                           - Return to GI office to review these results. Thornton Park MD, MD 10/02/2019 11:46:59 AM This report has been signed electronically.

## 2019-10-02 NOTE — Patient Instructions (Signed)
YOU HAD AN ENDOSCOPIC PROCEDURE TODAY AT THE [facility]:   Refer to the procedure report that was given to you for any specific questions about what was found during the examination.  If the procedure report does not answer your questions, please call your gastroenterologist to clarify.  If you requested that your care partner not be given the details of your procedure findings, then the procedure report has been included in a sealed envelope for you to review at your convenience later.  YOU SHOULD EXPECT: Some feelings of bloating in the abdomen. Passage of more gas than usual.  Walking can help get rid of the air that was put into your GI tract during the procedure and reduce the bloating. If you had a lower endoscopy (such as a colonoscopy or flexible sigmoidoscopy) you may notice spotting of blood in your stool or on the toilet paper. If you underwent a bowel prep for your procedure, you may not have a normal bowel movement for a few days.  Please Note:  You might notice some irritation and congestion in your nose or some drainage.  This is from the oxygen used during your procedure.  There is no need for concern and it should clear up in a day or so.  SYMPTOMS TO REPORT IMMEDIATELY:   Following lower endoscopy (colonoscopy or flexible sigmoidoscopy):  Excessive amounts of blood in the stool  Significant tenderness or worsening of abdominal pains  Swelling of the abdomen that is new, acute  Fever of 100F or higher  For urgent or emergent issues, a gastroenterologist can be reached at any hour by calling [phone]. Do not use MyChart messaging for urgent concerns.    DIET:  We do recommend a small meal at first, but then you may proceed to your regular diet.  Drink plenty of fluids but you should avoid alcoholic beverages for 24 hours.  ACTIVITY:  You should plan to take it easy for the rest of today and you should NOT DRIVE or use heavy machinery until tomorrow (because  of the sedation medicines used during the test).    FOLLOW UP: Our staff will call the number listed on your records 48-72 hours following your procedure to check on you and address any questions or concerns that you may have regarding the information given to you following your procedure. If we do not reach you, we will leave a message.  We will attempt to reach you two times.  During this call, we will ask if you have developed any symptoms of COVID 19. If you develop any symptoms (ie: fever, flu-like symptoms, shortness of breath, cough etc.) before then, please call [phone].  If you test positive for Covid 19 in the 2 weeks post procedure, please call and report this information to us.    If any biopsies were taken you will be contacted by phone or by letter within the next 1-3 weeks.  Please call us at [phone] if you have not heard about the biopsies in 3 weeks.    SIGNATURES/CONFIDENTIALITY: You and/or your care partner have signed paperwork which will be entered into your electronic medical record.  These signatures attest to the fact that that the information above on your After Visit Summary has been reviewed and is understood.  Full responsibility of the confidentiality of this discharge information lies with you and/or your care-partner. 

## 2019-10-02 NOTE — Progress Notes (Signed)
Called to room to assist during endoscopic procedure.  Patient ID and intended procedure confirmed with present staff. Received instructions for my participation in the procedure from the performing physician.  

## 2019-10-02 NOTE — Progress Notes (Signed)
Vital signs checked by:CW ? ?The medical and surgical history was reviewed and verified with the patient. ? ?

## 2019-10-02 NOTE — Progress Notes (Signed)
Patient received fleets enema per MD orders. Patient able to hold in enema for a good amount of time. BM after enema was still murky but clearer than prior to the enema. MD aware.

## 2019-10-06 ENCOUNTER — Telehealth: Payer: Self-pay

## 2019-10-06 ENCOUNTER — Telehealth: Payer: Self-pay | Admitting: *Deleted

## 2019-10-06 NOTE — Telephone Encounter (Signed)
No answer or ring for second post procedure callback. SM

## 2019-10-06 NOTE — Telephone Encounter (Signed)
First attempt follow up call to pt, lm on vm 

## 2019-10-15 ENCOUNTER — Encounter: Payer: Self-pay | Admitting: Gastroenterology

## 2019-10-15 NOTE — Progress Notes (Signed)
[place]  [phone] Fax:([phone]) [place] Note   Patient Care Team: Susy Frizzle, MD as PCP - General (Family Medicine) Jonnie Finner, RN as Oncology Nurse Navigator Jonnie Finner, RN as Oncology Nurse Navigator Truitt Merle, MD as Consulting Physician (Oncology) Date of Service: 10/16/2019   CHIEF COMPLAINTS/PURPOSE OF CONSULTATION:  History of colon cancer, referred by GI Dr. Thornton Park  HISTORY OF PRESENTING ILLNESS:  [facility] 64 y.o. female is here because of history of metastatic colon cancer.  She developed fatigue, weight gain, and change in bowel habits to constipation in 2010 and underwent diagnostic colonoscopy in [place]. 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 Adjuvant treatment was not recommended. On 04/17/2008 she underwent a CT showing an 8 x 9 mm lesion in the liver concerning for oligo hepatic metastasis.  Follow-up CT on 06/18/2008 showed the hypodense lesion in the right lobe had increased in size to 17 mm and two additional hypodense foci more anteriorly in the right lobe were seen.  A PET scan on 07/01/2008 showed three distinct regions of tracer activity in the liver, highly concerning for metastases.  She underwent port placement and subsequently began first-line FOLFOX and Avastin per Dr. Liliane Channel, however after two cycles oxaliplatin was discontinued due to neuropathy, anorexia and fatigue.  Restaging CT on 10/05/2008 and MRI on 11/03/2008 showed improvement in small hepatic metastases, no new or progressive disease.  She completed additional chemo and ultimately underwent partial hepatectomy requiring repair of right  hepatic vein and open cholecystectomy in 01/06/2009 by Dr. Sadie Haber.  Path revealed metastatic adenocarcinoma c/w colonic primary. The patient states she completed a course of 5FU-based chemotherapy then began colon cancer surveillance. CT on 08/31/2010 showed a new 75m lesion within the superior aspect of the right lobe which was PET avid, concerning for recurrent hepatic metastasis.  Abdominal MRI on 09/28/2010 showed a centrally located hepatic metastasis in segment 8 measuring 19 x 15 mm located centrally.  Due to low left liver volumes she underwent right portal vein embolization on 10/24/2010.  Follow-up MRI on 11/21/2010 showed interval decrease in the metastatic deposit in segment 8 and no new hepatic metastatic disease.  She was brought to the OR on 12/07/2010 for partial hepatectomy of segment 8, repair of the middle hepatic vein, and ventral hernia repair.  She remembers completing additional chemotherapy then surveillance; scans q6-12 months from 06/05/2011 - 07/08/2015 showed no evidence of recurrent or metastatic disease.  Her first colonoscopy after diagnosis was completed in 07/16/2012 which was reportedly unremarkable. [place]. She developed a change in bowel habits to constipation and weight gain, she went to her PCP on 07/28/2019 who ordered a CT on 08/13/2019 which showed a probable cyst in segment 2 of the liver and an ill-defined hypodensity in segment 8 measuring 1.4 x 0.6 cm and tiny indeterminate pulmonary lesions in the bases which, on follow-up CT chest on 08/29/2019 were stable from 2018 and considered benign. She was seen by GI Dr. BTarri Glennon 09/24/19.  A follow-up liver MRI on 09/30/2019 showed no findings of active malignancy in the liver, the concerning area in the right hepatic lobe seen on CT was not readily apparent on the MRI which  was felt to be reassuring. Colonoscopy 10/02/19 showed 2 polyps in the sigmoid colon, stool throughout the colon, and otherwise  normal except mild erythema in the rectum. Biopsies showed hyperplastic polyps.  Socially, she is married and lives with her wife. She has 5 children, 1 biological, the other 4 are her wife's biological children. 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 She drinks alcohol socially, denies tobacco or drug use. She is reportedly up to date on age appropriate cancer screenings. She has family history of a mother with lung cancer, father with lung cancer, sister with lung cancer, and a MGM had an unknown type of cancer. She denies family history of colon cancer.   Today, she presents alone. She has fatigue, increased appetite, weight gain, and constipation. She recently started metamucil after seeing GI. She had had a couple episodes of rectal bleeding, otherwise stools are dark. She has occasional RUQ pain and itching. Her left knee is also bothering her.   MEDICAL HISTORY:  Past Medical History:  Diagnosis Date  . Cancer [facility])    2010 Colon with mets to the liver, partial colectomy, chemo, and liver met resection  . Cataract   . Left leg pain    numbness  . Stroke [facility])    Pt reports that she thinks she had a stroke in her late 7s, early 48s.    SURGICAL HISTORY: Past Surgical History:  Procedure Laterality Date  . ABDOMINAL HYSTERECTOMY     due to fibroids at 35, [facility] BSO  . COLON SURGERY    . COLONOSCOPY    . JOINT REPLACEMENT     TKR x 2 on left knee  . LIVER SURGERY      SOCIAL HISTORY: Social History   Socioeconomic History  . Marital status: Soil scientist    Spouse name: Not on file  . Number of children: 5  . Years of education: 63  . Highest education level: High school graduate  Occupational History  . Occupation: Patent attorney  Tobacco Use  . Smoking status: Former Smoker    Types: Cigarettes  . Smokeless tobacco: Never Used  . Tobacco comment: high school days  Vaping Use  . Vaping Use: Never used  Substance and Sexual Activity  . Alcohol use:  Yes    Comment: beer every other day  . Drug use: No  . Sexual activity: Not on file  Other Topics Concern  . Not on file  Social History Narrative   Lives at home with her domestic partner, Peter Congo.   Right-handed.   Two cups coffee per day.   Social Determinants of Health   Financial Resource Strain:   . Difficulty of Paying Living Expenses: Not on file  Food Insecurity:   . Worried About Charity fundraiser in the Last Year: Not on file  . Ran Out of Food in the Last Year: Not on file  Transportation Needs:   . Lack of Transportation (Medical): Not on file  . Lack of Transportation (Non-Medical): Not on file  Physical Activity:   . Days of Exercise per Week: Not on file  . Minutes of Exercise per Session: Not on file  Stress:   . Feeling of Stress : Not on file  Social Connections:   . Frequency of Communication with Friends and Family: Not on file  . Frequency of Social Gatherings with Friends and Family: Not on file  . Attends Religious Services: Not on file  . Active Member of  Clubs or Organizations: Not on file  . Attends Archivist Meetings: Not on file  . Marital Status: Not on file  Intimate Partner Violence:   . Fear of Current or Ex-Partner: Not on file  . Emotionally Abused: Not on file  . Physically Abused: Not on file  . Sexually Abused: Not on file    FAMILY HISTORY: Family History  Problem Relation Age of Onset  . Cancer Mother        unsure of type  . Cancer Father        unsure of type  . Colon cancer Neg Hx   . Esophageal cancer Neg Hx   . Rectal cancer Neg Hx   . Stomach cancer Neg Hx     ALLERGIES:  is allergic to duloxetine.  MEDICATIONS:  Current Outpatient Medications  Medication Sig Dispense Refill  . acetaminophen (TYLENOL) 500 MG tablet Take 500 mg by mouth every 6 (six) hours as needed.    . diazepam (VALIUM) 5 MG tablet Take 1 tablet one hour prior to MRI. 1 tablet 0  . meloxicam (MOBIC) 15 MG tablet Take 1 tablet (15  mg total) by mouth daily. 30 tablet 5  . pregabalin (LYRICA) 50 MG capsule Take 1 capsule (50 mg total) by mouth 3 (three) times daily. 90 capsule 5   No current facility-administered medications for this visit.    REVIEW OF SYSTEMS:   Constitutional: Denies fevers, chills or abnormal night sweats (+) increased appetite (+) fatigue  Eyes: Denies blurriness of vision, double vision or watery eyes Ears, nose, mouth, throat, and face: Denies mucositis or sore throat Respiratory: Denies cough, dyspnea or wheezes Cardiovascular: Denies palpitation, chest discomfort or lower extremity swelling Gastrointestinal:  Denies nausea, heartburn (+) change in bowel habits (+) constipation (+) dark stool (+) bleeding x2 (+) RUQ pain and itching  Skin: Denies abnormal skin rashes Lymphatics: Denies new lymphadenopathy or easy bruising Neurological:Denies numbness, tingling or new weaknesses Behavioral/Psych: Mood is stable, no new changes  All other systems were reviewed with the patient and are negative.  PHYSICAL EXAMINATION: ECOG PERFORMANCE STATUS: 0 - Asymptomatic  Vitals:   10/16/19 1353  BP: 123/90  Pulse: 67  Resp: 16  Temp: (!) 97 F (36.1 C)  SpO2: 99%   Filed Weights   10/16/19 1353  Weight: 206 lb (93.4 kg)    GENERAL:alert, no distress and comfortable SKIN: no rash to exposed skin  EYES:  sclera clear NECK: without mass LUNGS: clear with normal breathing effort HEART: regular rate & rhythm, no lower extremity edema ABDOMEN:abdomen soft, non-tender and normal bowel sounds. No hepatomegaly or mass PSYCH: alert & oriented x 3 with fluent speech NEURO: no focal motor/sensory deficits  LABORATORY DATA:  I have reviewed the data as listed CBC Latest Ref Rng & Units 10/16/2019 07/28/2019 08/29/2018  WBC 4.0 - 10.5 K/uL 6.1 7.8 6.0  Hemoglobin 12.0 - 15.0 g/dL 13.3 12.5 13.5  Hematocrit 36 - 46 % 40.1 36.8 40.5  Platelets 150 - 400 K/uL 210 222 206   CMP Latest Ref Rng & Units  10/16/2019 09/24/2019 07/28/2019  Glucose 70 - 99 mg/dL 85 70 90  BUN 8 - 23 mg/dL _0 Creatinine 0.44 - 1.00 mg/dL 0.94 0.87 0.94  Sodium 135 - 145 mmol/L 144 141 142  Potassium 3.5 - 5.1 mmol/L 4.3 3.7 4.0  Chloride 98 - 111 mmol/L 109 105 106  CO2 22 - 32 mmol/L _1 Calcium 8.9 -  10.3 mg/dL 9.4 9.1 9.1  Total Protein 6.5 - 8.1 g/dL 7.4 - 6.8  Total Bilirubin 0.3 - 1.2 mg/dL 0.5 - 0.4  Alkaline Phos 38 - 126 U/L 80 - -  AST 15 - 41 U/L 16 - 18  ALT 0 - 44 U/L 13 - 13    RADIOGRAPHIC STUDIES: I have personally reviewed the radiological images as listed and agreed with the findings in the report. MR LIVER W WO CONTRAST  Result Date: 09/30/2019 CLINICAL DATA:  Colon cancer metastatic to the liver, prior resection of the sigmoid colon and resection of hepatic metastatic lesions with indeterminate potential lesion in segment 8 of the liver on CT scan of 08/13/2018, for further assessment EXAM: MRI ABDOMEN WITHOUT AND WITH CONTRAST TECHNIQUE: Multiplanar multisequence MR imaging of the abdomen was performed both before and after the administration of intravenous contrast. CONTRAST:  72m GADAVIST GADOBUTROL 1 MMOL/ML IV SOLN COMPARISON:  08/13/2019 FINDINGS: Lower chest: Right Port-A-Cath noted. Hepatobiliary: Postoperative findings within and along the liver including metal artifact from clips. In the region of concern in the right hepatic lobe where there was vague bandlike hypodensity on recent CT abdomen, we demonstrate no significant abnormal restriction of diffusion and no specific worrisome enhancement pattern characteristic of a metastatic lesion. There is some distortion in the vicinity with confluence of vessels probably related to prior resection Tiny suspected cyst in the lateral segment left hepatic lobe posteriorly on image 28/17. No significant biliary dilatation. Gallbladder absent. Pancreas:  Unremarkable Spleen:  Unremarkable Adrenals/Urinary Tract:  Unremarkable  Stomach/Bowel: Unremarkable Vascular/Lymphatic:  Unremarkable Other:  No supplemental non-categorized findings. Musculoskeletal: Unremarkable IMPRESSION: 1. No findings of active malignancy involving the liver. The area of concern in the right hepatic lobe seen on recent CT scan is not readily apparent on today's MRI. There is some distortion in the vicinity with confluence of vessels probably related to prior resection. Most recent CEA level of 09/24/2017 was low. Overall I consider the MRI appearance highly reassuring, and given that the patient's colon cancer was originally diagnosed and treated fairly remotely (in 2010 with chemotherapy through 2012), no further imaging follow up is considered to be required at this point assuming that of other clinical indicators remain benign. Electronically Signed   By: WVan ClinesM.D.   On: 09/30/2019 08:53    ASSESSMENT & PLAN: 64yo female with   1. 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 She initially presented with constipation, fatigue, and weight gain, diagnostic colonoscopy lead to sigmoid colectomy for stage II colon cancer, she developed recurrence in the liver few months later. She completed 5FU-based chemo then liver resection in 01/2009 with adjuvant chemo, then again after second liver recurrence in 11/2010. NED since then.  -she had been doing well on colon cancer surveillance until she recently developed fatigue, weight gain, and constipation again. CT/MRI in 08/2019 and colonoscopy in 09/2019 showed no evidence of recurrent or metastatic disease.  -she has no clinical signs of recurrence today. CBC, CMP, CEA all normal.  -we discussed the role of guardian reveal ctDNA testing and its limitations. There is not a lot of evidence in the setting of h/o metastatic disease, and the role of chemo is not entirely known if she were to test  positive at this point. She ultimately agrees to testing today. We will f/u with results and call her.  -we discussed the surveillance  plan. If ctDNA is negative we will see her back in 6 months. She will continue f/u with GI Dr. Tarri Glenn.   2. Chronic left knee pain -She is s/p left knee replacement, continue pcp/ortho follow up     PLAN: -medical record reviewed  -colon cancer surveillance -lab today including CBC, CMP, CEA, guardian reveal ctDNA testing  -if ctDNA is negative, f/u in 6 months  -continue f/u with GI and other routine providers   Orders Placed This Encounter  Procedures  . CBC with Differential (Cancer Center Only)    Standing Status:   Standing    Number of Occurrences:   10    Standing Expiration Date:   10/15/2020  . CMP ([place] only)    Standing Status:   Standing    Number of Occurrences:   10    Standing Expiration Date:   10/15/2020  . CEA (IN HOUSE-[facility])    Standing Status:   Standing    Number of Occurrences:   5    Standing Expiration Date:   10/15/2020  . Draw extra clot specimen    Standing Status:   Standing    Number of Occurrences:   1    Standing Expiration Date:   10/15/2020    All questions were answered. The patient knows to call the clinic with any problems, questions or concerns.     Alla Feeling, NP 10/20/2019   Addendum  I have seen the patient, examined her. I agree with the assessment and and plan and have edited the notes.   I have reviewed her previous outside records in care everywhere,and discussed with patient. It has been slightly over 10 years since her last cancer treatment, her risk of colon cancer recurrence is very low now. I reviewed her CT CAP scan from July 2021 and recent colonoscopy which were all negative for cancer recurrence, this is reassuring.  However patient developed intermittent abdominal pain in past 2 months, which is similar to her symptoms when she had metastatic colon cancer.  I will repeat her lab,  including tumor marker CA.  I discussed the role of GuardanReveal, for early colon cancer recurrence detection. She agrees. If lab negative, will see her back in 6 months for f/u. She will continue f/u with GI.   Truitt Merle  10/16/2019

## 2019-10-16 ENCOUNTER — Inpatient Hospital Stay: Payer: BC Managed Care – PPO | Attending: Nurse Practitioner | Admitting: Nurse Practitioner

## 2019-10-16 ENCOUNTER — Other Ambulatory Visit: Payer: Self-pay

## 2019-10-16 ENCOUNTER — Inpatient Hospital Stay: Payer: BC Managed Care – PPO

## 2019-10-16 VITALS — BP 123/90 | HR 67 | Temp 97.0°F | Resp 16 | Ht 69.0 in | Wt 206.0 lb

## 2019-10-16 DIAGNOSIS — R194 Change in bowel habit: Secondary | ICD-10-CM | POA: Insufficient documentation

## 2019-10-16 DIAGNOSIS — G8929 Other chronic pain: Secondary | ICD-10-CM

## 2019-10-16 DIAGNOSIS — Z79899 Other long term (current) drug therapy: Secondary | ICD-10-CM | POA: Insufficient documentation

## 2019-10-16 DIAGNOSIS — Z85038 Personal history of other malignant neoplasm of large intestine: Secondary | ICD-10-CM

## 2019-10-16 DIAGNOSIS — M25562 Pain in left knee: Secondary | ICD-10-CM | POA: Insufficient documentation

## 2019-10-16 DIAGNOSIS — Z809 Family history of malignant neoplasm, unspecified: Secondary | ICD-10-CM | POA: Diagnosis not present

## 2019-10-16 DIAGNOSIS — D125 Benign neoplasm of sigmoid colon: Secondary | ICD-10-CM | POA: Diagnosis not present

## 2019-10-16 DIAGNOSIS — Z90722 Acquired absence of ovaries, bilateral: Secondary | ICD-10-CM | POA: Diagnosis not present

## 2019-10-16 DIAGNOSIS — Z87891 Personal history of nicotine dependence: Secondary | ICD-10-CM | POA: Diagnosis not present

## 2019-10-16 DIAGNOSIS — K625 Hemorrhage of anus and rectum: Secondary | ICD-10-CM | POA: Diagnosis not present

## 2019-10-16 DIAGNOSIS — Z8673 Personal history of transient ischemic attack (TIA), and cerebral infarction without residual deficits: Secondary | ICD-10-CM | POA: Insufficient documentation

## 2019-10-16 DIAGNOSIS — R195 Other fecal abnormalities: Secondary | ICD-10-CM

## 2019-10-16 DIAGNOSIS — R1011 Right upper quadrant pain: Secondary | ICD-10-CM | POA: Diagnosis not present

## 2019-10-16 LAB — CMP (CANCER CENTER ONLY)
ALT: 13 U/L (ref 0–44)
AST: 16 U/L (ref 15–41)
Albumin: 3.8 g/dL (ref 3.5–5.0)
Alkaline Phosphatase: 80 U/L (ref 38–126)
Anion gap: 7 (ref 5–15)
BUN: 13 mg/dL (ref 8–23)
CO2: 28 mmol/L (ref 22–32)
Calcium: 9.4 mg/dL (ref 8.9–10.3)
Chloride: 109 mmol/L (ref 98–111)
Creatinine: 0.94 mg/dL (ref 0.44–1.00)
GFR, Est AFR Am: >60 mL/min (ref >60)
GFR, Estimated: >60 mL/min (ref >60)
Glucose, Bld: 85 mg/dL (ref 70–99)
Potassium: 4.3 mmol/L (ref 3.5–5.1)
Sodium: 144 mmol/L (ref 135–145)
Total Bilirubin: 0.5 mg/dL (ref 0.3–1.2)
Total Protein: 7.4 g/dL (ref 6.5–8.1)

## 2019-10-16 LAB — CBC WITH DIFFERENTIAL (CANCER CENTER ONLY)
Abs Immature Granulocytes: 0.02 10*3/uL (ref 0.00–0.07)
Basophils Absolute: 0 10*3/uL (ref 0.0–0.1)
Basophils Relative: 1 %
Eosinophils Absolute: 0.1 10*3/uL (ref 0.0–0.5)
Eosinophils Relative: 2 %
HCT: 40.1 % (ref 36.0–46.0)
Hemoglobin: 13.3 g/dL (ref 12.0–15.0)
Immature Granulocytes: 0 %
Lymphocytes Relative: 30 %
Lymphs Abs: 1.9 10*3/uL (ref 0.7–4.0)
MCH: 30.9 pg (ref 26.0–34.0)
MCHC: 33.2 g/dL (ref 30.0–36.0)
MCV: 93.3 fL (ref 80.0–100.0)
Monocytes Absolute: 0.5 10*3/uL (ref 0.1–1.0)
Monocytes Relative: 8 %
Neutro Abs: 3.6 10*3/uL (ref 1.7–7.7)
Neutrophils Relative %: 59 %
Platelet Count: 210 10*3/uL (ref 150–400)
RBC: 4.3 MIL/uL (ref 3.87–5.11)
RDW: 12.3 % (ref 11.5–15.5)
WBC Count: 6.1 10*3/uL (ref 4.0–10.5)
nRBC: 0 % (ref 0.0–0.2)

## 2019-10-17 ENCOUNTER — Telehealth: Payer: Self-pay | Admitting: Nurse Practitioner

## 2019-10-17 LAB — CEA (IN HOUSE-CHCC): CEA (CHCC-In House): <1 ng/mL (ref 0.00–5.00)

## 2019-10-17 NOTE — Telephone Encounter (Signed)
Scheduled per 9/9 los. Unable to reach pt. Left voicemail with appt time and date.

## 2019-10-20 ENCOUNTER — Telehealth: Payer: Self-pay

## 2019-10-20 ENCOUNTER — Encounter: Payer: Self-pay | Admitting: Nurse Practitioner

## 2019-10-20 NOTE — Telephone Encounter (Signed)
-----   Message from Alla Feeling, NP sent at 10/19/2019  4:51 PM EDT ----- Please let her know CBC, CMP, CEA all normal which is great. We will call her when the guardant reveal/circulating tumor DNA test is back.   Thanks, Regan Rakers NP

## 2019-10-20 NOTE — Telephone Encounter (Signed)
Called spoke with pt made her aware of lab results and PENDING at this time gaurdant reveal/circulating tumor DNA encouraged to call for questions concerns or change s

## 2019-10-29 ENCOUNTER — Ambulatory Visit: Payer: BC Managed Care – PPO | Admitting: Gastroenterology

## 2019-10-31 DIAGNOSIS — C19 Malignant neoplasm of rectosigmoid junction: Secondary | ICD-10-CM | POA: Diagnosis not present

## 2019-11-04 ENCOUNTER — Encounter: Payer: Self-pay | Admitting: Nurse Practitioner

## 2019-11-04 ENCOUNTER — Telehealth: Payer: Self-pay

## 2019-11-04 LAB — GUARDANT 360

## 2019-11-04 NOTE — Telephone Encounter (Signed)
-----   Message from Alla Feeling, NP sent at 11/04/2019 11:37 AM EDT ----- Please let her know guardant reveal testing is negative, no detectable circulating tumor DNA, great news. We will see her in 04/2020 as planned, or sooner if she has new needs/concerns.   Thanks, Regan Rakers

## 2019-11-04 NOTE — Telephone Encounter (Signed)
Left vm for Kathryn Zavala to return my call.

## 2019-11-07 ENCOUNTER — Encounter: Payer: Self-pay | Admitting: Nurse Practitioner

## 2019-11-07 ENCOUNTER — Telehealth: Payer: Self-pay | Admitting: *Deleted

## 2019-11-07 NOTE — Telephone Encounter (Signed)
Pt returned call- gave guardant negative result

## 2020-01-16 ENCOUNTER — Encounter (HOSPITAL_COMMUNITY): Payer: Self-pay

## 2020-01-16 ENCOUNTER — Other Ambulatory Visit: Payer: Self-pay

## 2020-01-16 ENCOUNTER — Ambulatory Visit (HOSPITAL_COMMUNITY)
Admission: EM | Admit: 2020-01-16 | Discharge: 2020-01-16 | Disposition: A | Discharge status: Home / Self Care | Payer: BC Managed Care – PPO | Attending: Internal Medicine | Admitting: Internal Medicine

## 2020-01-16 DIAGNOSIS — Z87891 Personal history of nicotine dependence: Secondary | ICD-10-CM | POA: Diagnosis not present

## 2020-01-16 DIAGNOSIS — Z8673 Personal history of transient ischemic attack (TIA), and cerebral infarction without residual deficits: Secondary | ICD-10-CM | POA: Insufficient documentation

## 2020-01-16 DIAGNOSIS — B349 Viral infection, unspecified: Secondary | ICD-10-CM | POA: Insufficient documentation

## 2020-01-16 DIAGNOSIS — Z791 Long term (current) use of non-steroidal anti-inflammatories (NSAID): Secondary | ICD-10-CM | POA: Diagnosis not present

## 2020-01-16 DIAGNOSIS — Z20822 Contact with and (suspected) exposure to covid-19: Secondary | ICD-10-CM | POA: Insufficient documentation

## 2020-01-16 DIAGNOSIS — R52 Pain, unspecified: Secondary | ICD-10-CM | POA: Diagnosis not present

## 2020-01-16 DIAGNOSIS — Z96652 Presence of left artificial knee joint: Secondary | ICD-10-CM | POA: Diagnosis not present

## 2020-01-16 DIAGNOSIS — R059 Cough, unspecified: Secondary | ICD-10-CM | POA: Insufficient documentation

## 2020-01-16 DIAGNOSIS — Z79899 Other long term (current) drug therapy: Secondary | ICD-10-CM | POA: Insufficient documentation

## 2020-01-16 LAB — RESP PANEL BY RT-PCR (FLU A&B, COVID) ARPGX2
Influenza A by PCR: NEGATIVE
Influenza B by PCR: NEGATIVE
SARS Coronavirus 2 by RT PCR: NEGATIVE

## 2020-01-16 MED ORDER — BENZONATATE 100 MG PO CAPS: 100.0000 mg | ORAL_CAPSULE | Freq: Three times a day (TID) | ORAL | 0 refills | Status: DC

## 2020-01-16 MED ORDER — IBUPROFEN 600 MG PO TABS: 600.0000 mg | ORAL_TABLET | Freq: Four times a day (QID) | ORAL | 0 refills | Status: DC | PRN

## 2020-01-16 NOTE — Discharge Instructions (Addendum)
Please take medications as directed Please quarantine until COVID-19 test results are available If you results are abnormal we will call you with recommendations Push oral fluids

## 2020-01-16 NOTE — ED Provider Notes (Signed)
[place]    CSN: [id number] Arrival date & time: 01/16/20  0802      History   Chief Complaint Chief Complaint  Patient presents with  . Cough  . Headache  . Generalized Body Aches    HPI Kathryn Zavala is a 64 y.o. female comes to urgent care with complaints of generalized body aches, headaches, nonproductive cough, runny nose and subjective fevers as well as chills.  Symptoms started 2 days ago and has been persistent.  Patient denies any nausea or vomiting.  Appetite is reduced slightly.  No dizziness, near syncope or syncopal episodes.  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  No wheezing or chest pain.  Cough is nonproductive of sputum.  No known sick contacts.  Patient is fully vaccinated against COVID-19 virus   HPI  Past Medical History:  Diagnosis Date  . Cancer [facility])    2010 Colon with mets to the liver, partial colectomy, chemo, and liver met resection  . Cataract   . Left leg pain    numbness  . Stroke [facility])    Pt reports that she thinks she had a stroke in her late 37s, early 15s.    Patient Active Problem List   Diagnosis Date Noted  . Left leg pain 08/07/2019  . Gait abnormality 08/07/2019  . Stroke ([place]) 10/29/2017  . Depression 10/29/2017  . Paronychia of finger of right hand 12/21/2016  . Presence of left artificial knee joint 09/04/2013  . Knee pain 11/20/2012  . Colon cancer ([place]) 07/16/2012  . Metastasis to liver ([place]) 03/09/2009    Past Surgical History:  Procedure Laterality Date  . ABDOMINAL HYSTERECTOMY     due to fibroids at 35, TAH BSO  . COLON SURGERY    . COLONOSCOPY    . JOINT REPLACEMENT     TKR x 2 on left knee  . LIVER SURGERY      OB History   No obstetric history on file.      Home Medications    Prior to Admission medications   Medication Sig Start Date End Date Taking? Authorizing Provider  acetaminophen (TYLENOL) 500 MG tablet Take 500 mg by mouth every 6 (six) hours as needed.     [provider]  benzonatate (TESSALON) 100 MG capsule Take 1 capsule (100 mg total) by mouth every 8 (eight) hours. 01/16/20   Chase Picket, MD  diazepam (VALIUM) 5 MG tablet Take 1 tablet one hour prior to MRI. 09/29/19   Thornton Park, MD  ibuprofen (ADVIL) 600 MG tablet Take 1 tablet (600 mg total) by mouth every 6 (six) hours as needed. 01/16/20   Chase Picket, MD  meloxicam (MOBIC) 15 MG tablet Take 1 tablet (15 mg total) by mouth daily. 08/15/19   Marcial Pacas, MD  pregabalin (LYRICA) 50 MG capsule Take 1 capsule (50 mg total) by mouth 3 (three) times daily. 08/15/19   Marcial Pacas, MD    Family History Family History  Problem Relation Age of Onset  . Cancer Mother        unsure of type  . Cancer Father        unsure of type  . Colon cancer Neg Hx   . Esophageal cancer Neg Hx   . Rectal cancer Neg Hx   . Stomach cancer Neg Hx     Social History Social History   Tobacco Use  . Smoking status: Former Smoker    Types: Cigarettes  . Smokeless  tobacco: Never Used  . Tobacco comment: high school days  Vaping Use  . Vaping Use: Never used  Substance Use Topics  . Alcohol use: Yes    Comment: beer every other day  . Drug use: No     Allergies   Duloxetine   Review of Systems Review of Systems  Constitutional: Positive for chills and fatigue. Negative for appetite change and diaphoresis.  HENT: Positive for congestion, rhinorrhea and sore throat. Negative for trouble swallowing.   Respiratory: Positive for cough. Negative for shortness of breath and wheezing.   Cardiovascular: Negative for chest pain.  Musculoskeletal: Positive for arthralgias and myalgias.  Neurological: Positive for headaches.     Physical Exam Triage Vital Signs ED Triage Vitals  Enc Vitals Group     BP 01/16/20 0828 109/74     Pulse Rate 01/16/20 0828 (!) 58     Resp 01/16/20 0828 17     Temp 01/16/20 0828 97.9 F (36.6 C)     Temp Source 01/16/20 0828 Oral     SpO2  01/16/20 0828 94 %     Weight --      Height --      Head Circumference --      Peak Flow --      Pain Score 01/16/20 0826 6     Pain Loc --      Pain Edu? --      Excl. in [place]? --    No data found.  Updated Vital Signs BP 109/74 (BP Location: Right Arm)   Pulse (!) 58   Temp 97.9 F (36.6 C) (Oral)   Resp 17   SpO2 94%   Visual Acuity Right Eye Distance:   Left Eye Distance:   Bilateral Distance:    Right Eye Near:   Left Eye Near:    Bilateral Near:     Physical Exam Vitals and nursing note reviewed.  Constitutional:      General: She is not in acute distress.    Appearance: She is ill-appearing.  Eyes:     Extraocular Movements: Extraocular movements intact.  Cardiovascular:     Rate and Rhythm: Normal rate and regular rhythm.  Pulmonary:     Effort: Pulmonary effort is normal.  Abdominal:     General: Bowel sounds are normal.     Palpations: Abdomen is soft.  Musculoskeletal:     Cervical back: Normal range of motion. No rigidity.  Lymphadenopathy:     Cervical: No cervical adenopathy.  Neurological:     Mental Status: She is alert.     GCS: GCS eye subscore is 4. GCS verbal subscore is 5. GCS motor subscore is 6.      UC Treatments / Results  Labs (all labs ordered are listed, but only abnormal results are displayed) Labs Reviewed  RESP PANEL BY RT-PCR (FLU A&B, COVID) [id number]    EKG   Radiology No results found.  Procedures Procedures (including critical care time)  Medications Ordered in UC Medications - No data to display  Initial Impression / Assessment and Plan / UC Course  I have reviewed the triage vital signs and the nursing notes.  Pertinent labs & imaging results that were available during my care of the patient were reviewed by me and considered in my medical decision making (see chart for details).     1.  Acute viral syndrome: Respiratory PCR for COVID-19 and influenza A/B. Tessalon Perles as needed for cough Ibuprofen  as needed  for fever and/or body aches Push oral fluids If your labs are abnormal we will call you with recommendations If your symptoms worsen please return to the urgent care to be reevaluated.  Final Clinical Impressions(s) / UC Diagnoses   Final diagnoses:  Acute viral syndrome     Discharge Instructions     Please take medications as directed Please quarantine until COVID-19 test results are available If you results are abnormal we will call you with recommendations Push oral fluids   ED Prescriptions    Medication Sig Dispense Auth. Provider   benzonatate (TESSALON) 100 MG capsule Take 1 capsule (100 mg total) by mouth every 8 (eight) hours. 21 capsule Gerilynn Mccullars, Myrene Galas, MD   ibuprofen (ADVIL) 600 MG tablet Take 1 tablet (600 mg total) by mouth every 6 (six) hours as needed. 30 tablet Hannahmarie Asberry, Myrene Galas, MD     PDMP not reviewed this encounter.   Chase Picket, MD 01/16/20 0900

## 2020-01-16 NOTE — ED Triage Notes (Signed)
Pt presents with non productive cough, headache, generalized body aches, nasal drainage, and fatigue X 2 days.

## 2020-02-27 ENCOUNTER — Encounter: Payer: Self-pay | Admitting: Family Medicine

## 2020-02-27 ENCOUNTER — Other Ambulatory Visit: Payer: Self-pay

## 2020-02-27 ENCOUNTER — Ambulatory Visit (INDEPENDENT_AMBULATORY_CARE_PROVIDER_SITE_OTHER): Payer: Medicare Other | Admitting: Family Medicine

## 2020-02-27 VITALS — BP 130/78 | HR 74 | Temp 98.4°F | Resp 16 | Ht 69.0 in | Wt 210.0 lb

## 2020-02-27 DIAGNOSIS — F321 Major depressive disorder, single episode, moderate: Secondary | ICD-10-CM

## 2020-02-27 MED ORDER — ESCITALOPRAM OXALATE 10 MG PO TABS: 10.0000 mg | ORAL_TABLET | Freq: Every day | ORAL | 3 refills | Status: DC

## 2020-02-27 NOTE — Progress Notes (Signed)
Subjective:    Patient ID: Kathryn Zavala, female    DOB: 25-Jan-1956, 65 y.o.   MRN: [id number]  HPI Patient reports feeling depression.  She states the symptoms of been present now for several months.  Over the last several months she has lost 4 friends to cancer.  Furthermore she feels isolated at home because of the [place] pandemic.  She also is out of work.  All of these things together have led to her overall sense of depression in her life.  She reports anhedonia.  She reports trouble sleeping.  She reports decreased energy, decreased drive, decreased motivation.  She is gaining weight but she has very little appetite as well.  She reports decreased concentration.  She denies any suicidal or homicidal ideation.  She denies any manic symptoms. Past Medical History:  Diagnosis Date   Cancer [facility])    2010 Colon with mets to the liver, partial colectomy, chemo, and liver met resection   Cataract    Left leg pain    numbness   Stroke ([place])    Pt reports that she thinks she had a stroke in her late 61s, early 41s.    Past Surgical History:  Procedure Laterality Date   ABDOMINAL HYSTERECTOMY     due to fibroids at 66, TAH BSO   COLON SURGERY     COLONOSCOPY     JOINT REPLACEMENT     TKR x 2 on left knee   LIVER SURGERY     Current Outpatient Medications on File Prior to Visit  Medication Sig Dispense Refill   acetaminophen (TYLENOL) 500 MG tablet Take 500 mg by mouth every 6 (six) hours as needed.     benzonatate (TESSALON) 100 MG capsule Take 1 capsule (100 mg total) by mouth every 8 (eight) hours. 21 capsule 0   diazepam (VALIUM) 5 MG tablet Take 1 tablet one hour prior to MRI. 1 tablet 0   ibuprofen (ADVIL) 600 MG tablet Take 1 tablet (600 mg total) by mouth every 6 (six) hours as needed. 30 tablet 0   meloxicam (MOBIC) 15 MG tablet Take 1 tablet (15 mg total) by mouth daily. 30 tablet 5   pregabalin (LYRICA) 50 MG capsule Take 1 capsule (50 mg total) by mouth  3 (three) times daily. 90 capsule 5   No current facility-administered medications on file prior to visit.   Allergies  Allergen Reactions   Duloxetine Diarrhea, Nausea And Vomiting and Other (See Comments)    red blisters on face and arms   Social History   Socioeconomic History   Marital status: Soil scientist    Spouse name: Not on file   Number of children: 5   Years of education: 12   Highest education level: High school graduate  Occupational History   Occupation: Patent attorney  Tobacco Use   Smoking status: Former Smoker    Types: Cigarettes   Smokeless tobacco: Never Used   Tobacco comment: high school days  Vaping Use   Vaping Use: Never used  Substance and Sexual Activity   Alcohol use: Yes    Comment: beer every other day   Drug use: No   Sexual activity: Not on file  Other Topics Concern   Not on file  Social History Narrative   Lives at home with her domestic partner, Peter Congo.   Right-handed.   Two cups coffee per day.   Social Determinants of Health   Financial Resource Strain: Not on file  Food  Insecurity: Not on file  Transportation Needs: Not on file  Physical Activity: Not on file  Stress: Not on file  Social Connections: Not on file  Intimate Partner Violence: Not on file   Family History  Problem Relation Age of Onset   Cancer Mother        unsure of type   Cancer Father        unsure of type   Colon cancer Neg Hx    Esophageal cancer Neg Hx    Rectal cancer Neg Hx    Stomach cancer Neg Hx       Review of Systems  Psychiatric/Behavioral: Positive for depression.  All other systems reviewed and are negative.      Objective:   Physical Exam Vitals reviewed.  Constitutional:      General: She is not in acute distress.    Appearance: She is well-developed. She is not diaphoretic.  HENT:     Head: Normocephalic and atraumatic.  Neck:     Thyroid: No thyromegaly.     Vascular: No JVD.     Trachea:  No tracheal deviation.  Cardiovascular:     Rate and Rhythm: Normal rate and regular rhythm.     Heart sounds: Normal heart sounds. No murmur heard. No friction rub. No gallop.   Pulmonary:     Effort: Pulmonary effort is normal. No respiratory distress.     Breath sounds: Normal breath sounds. No stridor. No wheezing or rales.  Chest:     Chest wall: No tenderness.  Neurological:     Mental Status: She is alert.     Motor: No abnormal muscle tone.  Psychiatric:        Mood and Affect: Mood normal.        Behavior: Behavior normal.        Thought Content: Thought content normal.        Judgment: Judgment normal.           Assessment & Plan:  Depression, major, single episode, moderate (HCC)  Start the patient on Lexapro 10 mg a day and then reassess in 4 weeks sooner if worsening.

## 2020-04-14 ENCOUNTER — Inpatient Hospital Stay: Payer: Self-pay

## 2020-04-14 ENCOUNTER — Inpatient Hospital Stay: Payer: Self-pay | Attending: Nurse Practitioner | Admitting: Nurse Practitioner

## 2020-04-16 ENCOUNTER — Telehealth: Payer: Self-pay | Admitting: Nurse Practitioner

## 2020-04-16 NOTE — Telephone Encounter (Signed)
Scheduled appts per 3/10 sch msg. Called pt no answer. Left vm with appt date and time.

## 2020-04-29 ENCOUNTER — Inpatient Hospital Stay: Payer: Self-pay

## 2020-04-29 ENCOUNTER — Inpatient Hospital Stay: Payer: Self-pay | Admitting: Nurse Practitioner

## 2020-04-29 NOTE — Progress Notes (Deleted)
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 stage II  -Recurrence in the liver few months later. She completed 5FU-based chemo then liver resection in 01/2009 with adjuvant chemo -Second recurrence liver in 08/2010 s/p right portal vein emolization and possibly additional chemo. NED since then.  -ctDNA not detected 10/16/2019      07/16/2012 Initial Diagnosis   Colon cancer ([place])     CURRENT THERAPY: Surveillance  INTERVAL HISTORY: Ms. Boss returns for surveillance visit as scheduled.  She was last seen by [place] at her initial consult on 10/16/2019.   REVIEW OF SYSTEMS:   Constitutional: Denies fevers, chills or abnormal weight loss Eyes: Denies blurriness of vision Ears, nose, mouth, throat, and face: Denies mucositis or sore throat Respiratory: Denies cough, dyspnea or wheezes Cardiovascular: Denies palpitation, chest discomfort or lower extremity swelling Gastrointestinal:  Denies nausea, heartburn or change in bowel habits Skin: Denies abnormal skin  rashes Lymphatics: Denies new lymphadenopathy or easy bruising Neurological:Denies numbness, tingling or new weaknesses Behavioral/Psych: Mood is stable, no new changes  All other systems were reviewed with the patient and are negative.  MEDICAL HISTORY:  Past Medical History:  Diagnosis Date  . Cancer [facility])    2010 Colon with mets to the liver, partial colectomy, chemo, and liver met resection  . Cataract   . Left leg pain    numbness  . Stroke [facility])    Pt reports that she thinks she had a stroke in her late 39s, early 82s.    SURGICAL HISTORY: Past Surgical History:  Procedure Laterality Date  . ABDOMINAL HYSTERECTOMY     due to fibroids at 35, TAH BSO  . COLON SURGERY    . COLONOSCOPY    . JOINT REPLACEMENT     TKR x 2 on left knee  . LIVER SURGERY      I have reviewed the social history and family history with the patient and they are unchanged from previous note.  ALLERGIES:  is allergic to duloxetine.  MEDICATIONS:  Current Outpatient Medications  Medication Sig Dispense Refill  . acetaminophen (TYLENOL) 500 MG tablet Take 500 mg by mouth every 6 (six) hours as needed.    . benzonatate (TESSALON) 100 MG capsule Take 1 capsule (100 mg total) by mouth every 8 (eight) hours. 21 capsule 0  . diazepam (VALIUM) 5 MG tablet Take 1 tablet one hour prior to MRI. 1 tablet 0  . escitalopram (LEXAPRO) 10 MG tablet Take 1 tablet (10 mg total) by mouth daily. 30 tablet 3  . ibuprofen (ADVIL) 600 MG tablet Take 1 tablet (600 mg total) by mouth every 6 (six) hours as needed. 30 tablet 0  . meloxicam (MOBIC)  15 MG tablet Take 1 tablet (15 mg total) by mouth daily. 30 tablet 5  . pregabalin (LYRICA) 50 MG capsule Take 1 capsule (50 mg total) by mouth 3 (three) times daily. 90 capsule 5   No current facility-administered medications for this visit.    PHYSICAL EXAMINATION: ECOG PERFORMANCE STATUS: {CHL ONC ECOG PS:[id number]}  There were no vitals filed for this visit. There  were no vitals filed for this visit.  GENERAL:alert, no distress and comfortable SKIN: skin color, texture, turgor are normal, no rashes or significant lesions EYES: normal, Conjunctiva are pink and non-injected, sclera clear OROPHARYNX:no exudate, no erythema and lips, buccal mucosa, and tongue normal  NECK: supple, thyroid normal size, non-tender, without nodularity LYMPH:  no palpable lymphadenopathy in the cervical, axillary or inguinal LUNGS: clear to auscultation and percussion with normal breathing effort HEART: regular rate & rhythm and no murmurs and no lower extremity edema ABDOMEN:abdomen soft, non-tender and normal bowel sounds Musculoskeletal:no cyanosis of digits and no clubbing  NEURO: alert & oriented x 3 with fluent speech, no focal motor/sensory deficits  LABORATORY DATA:  I have reviewed the data as listed CBC Latest Ref Rng & Units 10/16/2019 07/28/2019 08/29/2018  WBC 4.0 - 10.5 K/uL 6.1 7.8 6.0  Hemoglobin 12.0 - 15.0 g/dL 13.3 12.5 13.5  Hematocrit 36.0 - 46.0 % 40.1 36.8 40.5  Platelets 150 - 400 K/uL 210 222 206     CMP Latest Ref Rng & Units 10/16/2019 09/24/2019 07/28/2019  Glucose 70 - 99 mg/dL 85 70 90  BUN 8 - 23 mg/dL 13 14 11   Creatinine 0.44 - 1.00 mg/dL 0.94 0.87 0.94  Sodium 135 - 145 mmol/L 144 141 142  Potassium 3.5 - 5.1 mmol/L 4.3 3.7 4.0  Chloride 98 - 111 mmol/L 109 105 106  CO2 22 - 32 mmol/L 28 30 27   Calcium 8.9 - 10.3 mg/dL 9.4 9.1 9.1  Total Protein 6.5 - 8.1 g/dL 7.4 - 6.8  Total Bilirubin 0.3 - 1.2 mg/dL 0.5 - 0.4  Alkaline Phos 38 - 126 U/L 80 - -  AST 15 - 41 U/L 16 - 18  ALT 0 - 44 U/L 13 - 13      RADIOGRAPHIC STUDIES: I have personally reviewed the radiological images as listed and agreed with the findings in the report. No results found.   ASSESSMENT & PLAN:  No problem-specific Assessment & Plan notes found for this encounter.   No orders of the defined types were placed in this encounter.  All questions were answered.  The patient knows to call the clinic with any problems, questions or concerns. No barriers to learning was detected. I spent {CHL ONC TIME VISIT - VOJJK:[id number]} counseling the patient face to face. The total time spent in the appointment was {CHL ONC TIME VISIT - ZJIRC:[id number]} and more than 50% was on counseling and review of test results     Alla Feeling, NP 04/29/20

## 2020-05-03 ENCOUNTER — Telehealth: Payer: Self-pay | Admitting: Nurse Practitioner

## 2020-05-03 NOTE — Telephone Encounter (Signed)
R/s appt per 3/27 sch msg. Called pt, no answer. Left msg with appt date and time.

## 2020-05-10 NOTE — Progress Notes (Deleted)
[place]   Telephone:(336) [phone] Fax:(336) [phone]   Clinic Follow up Note   Patient Care Team: Susy Frizzle, MD as PCP - General (Family Medicine) Jonnie Finner, RN as Oncology Nurse Navigator Jonnie Finner, RN as Oncology Nurse Navigator Truitt Merle, MD as Consulting Physician (Oncology) Thornton Park, MD as Consulting Physician (Gastroenterology) 05/10/2020  CHIEF COMPLAINT: Follow up colon cancer   SUMMARY OF ONCOLOGIC HISTORY: Oncology History  Colon cancer [facility])  2010 Miscellaneous   H/o sigmoid colon cancer pT3N0 2010 with recurrent hepatic metastatic disease in 2010 and 2012, NED since 2013 -Initially presented with constipation, fatigue, and weight gain, diagnostic colonoscopy lead to sigmoid colectomy  -surgical path: infiltrating well differentiated adenocarcinoma measuring 3.6 cm 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 stage II  -Recurrence in the liver few months later. She completed 5FU-based chemo then liver resection in 01/2009 with adjuvant chemo -Second recurrence liver in 08/2010 s/p right portal vein emolization and possibly additional chemo. NED since then.  -ctDNA not detected 10/16/2019      07/16/2012 Initial Diagnosis   Colon cancer ([place])     CURRENT THERAPY: Surveillance   INTERVAL HISTORY: Ms. Vetere returns for follow up as scheduled. She was last seen 10/16/19.    REVIEW OF SYSTEMS:   Constitutional: Denies fevers, chills or abnormal weight loss Eyes: Denies blurriness of vision Ears, nose, mouth, throat, and face: Denies mucositis or sore throat Respiratory: Denies cough, dyspnea or wheezes Cardiovascular: Denies palpitation, chest discomfort or lower extremity swelling Gastrointestinal:  Denies nausea, heartburn or change in bowel habits Skin: Denies abnormal skin rashes Lymphatics: Denies new lymphadenopathy or easy  bruising Neurological:Denies numbness, tingling or new weaknesses Behavioral/Psych: Mood is stable, no new changes  All other systems were reviewed with the patient and are negative.  MEDICAL HISTORY:  Past Medical History:  Diagnosis Date  . Cancer [facility])    2010 Colon with mets to the liver, partial colectomy, chemo, and liver met resection  . Cataract   . Left leg pain    numbness  . Stroke [facility])    Pt reports that she thinks she had a stroke in her late 38s, early 59s.    SURGICAL HISTORY: Past Surgical History:  Procedure Laterality Date  . ABDOMINAL HYSTERECTOMY     due to fibroids at 35, TAH BSO  . COLON SURGERY    . COLONOSCOPY    . JOINT REPLACEMENT     TKR x 2 on left knee  . LIVER SURGERY      I have reviewed the social history and family history with the patient and they are unchanged from previous note.  ALLERGIES:  is allergic to duloxetine.  MEDICATIONS:  Current Outpatient Medications  Medication Sig Dispense Refill  . acetaminophen (TYLENOL) 500 MG tablet Take 500 mg by mouth every 6 (six) hours as needed.    . benzonatate (TESSALON) 100 MG capsule Take 1 capsule (100 mg total) by mouth every 8 (eight) hours. 21 capsule 0  . diazepam (VALIUM) 5 MG tablet Take 1 tablet one hour prior to MRI. 1 tablet 0  . escitalopram (LEXAPRO) 10 MG tablet Take 1 tablet (10 mg total) by mouth daily. 30 tablet 3  . ibuprofen (ADVIL) 600 MG tablet Take 1 tablet (600 mg total) by mouth every 6 (six) hours as needed. 30 tablet 0  . meloxicam (MOBIC) 15 MG tablet Take 1 tablet (  15 mg total) by mouth daily. 30 tablet 5  . pregabalin (LYRICA) 50 MG capsule Take 1 capsule (50 mg total) by mouth 3 (three) times daily. 90 capsule 5   No current facility-administered medications for this visit.    PHYSICAL EXAMINATION: ECOG PERFORMANCE STATUS: {CHL ONC ECOG PS:[id number]}  There were no vitals filed for this visit. There were no vitals filed for this visit.  GENERAL:alert, no  distress and comfortable SKIN: skin color, texture, turgor are normal, no rashes or significant lesions EYES: normal, Conjunctiva are pink and non-injected, sclera clear OROPHARYNX:no exudate, no erythema and lips, buccal mucosa, and tongue normal  NECK: supple, thyroid normal size, non-tender, without nodularity LYMPH:  no palpable lymphadenopathy in the cervical, axillary or inguinal LUNGS: clear to auscultation and percussion with normal breathing effort HEART: regular rate & rhythm and no murmurs and no lower extremity edema ABDOMEN:abdomen soft, non-tender and normal bowel sounds Musculoskeletal:no cyanosis of digits and no clubbing  NEURO: alert & oriented x 3 with fluent speech, no focal motor/sensory deficits  LABORATORY DATA:  I have reviewed the data as listed CBC Latest Ref Rng & Units 10/16/2019 07/28/2019 08/29/2018  WBC 4.0 - 10.5 K/uL 6.1 7.8 6.0  Hemoglobin 12.0 - 15.0 g/dL 13.3 12.5 13.5  Hematocrit 36.0 - 46.0 % 40.1 36.8 40.5  Platelets 150 - 400 K/uL 210 222 206     CMP Latest Ref Rng & Units 10/16/2019 09/24/2019 07/28/2019  Glucose 70 - 99 mg/dL 85 70 90  BUN 8 - 23 mg/dL _0 Creatinine 0.44 - 1.00 mg/dL 0.94 0.87 0.94  Sodium 135 - 145 mmol/L 144 141 142  Potassium 3.5 - 5.1 mmol/L 4.3 3.7 4.0  Chloride 98 - 111 mmol/L 109 105 106  CO2 22 - 32 mmol/L _1 Calcium 8.9 - 10.3 mg/dL 9.4 9.1 9.1  Total Protein 6.5 - 8.1 g/dL 7.4 - 6.8  Total Bilirubin 0.3 - 1.2 mg/dL 0.5 - 0.4  Alkaline Phos 38 - 126 U/L 80 - -  AST 15 - 41 U/L 16 - 18  ALT 0 - 44 U/L 13 - 13      RADIOGRAPHIC STUDIES: I have personally reviewed the radiological images as listed and agreed with the findings in the report. No results found.   ASSESSMENT & PLAN:  No problem-specific Assessment & Plan notes found for this encounter.   No orders of the defined types were placed in this encounter.  All questions were answered. The patient knows to call the clinic with any problems,  questions or concerns. No barriers to learning was detected. I spent {CHL ONC TIME VISIT - QMVHQ:[id number]} counseling the patient face to face. The total time spent in the appointment was {CHL ONC TIME VISIT - XLKGM:[id number]} and more than 50% was on counseling and review of test results     Alla Feeling, NP 05/10/20

## 2020-05-11 ENCOUNTER — Inpatient Hospital Stay: Payer: Self-pay | Admitting: Nurse Practitioner

## 2020-05-11 ENCOUNTER — Inpatient Hospital Stay: Payer: Self-pay | Attending: Nurse Practitioner

## 2020-05-29 ENCOUNTER — Encounter: Payer: Self-pay | Admitting: Family Medicine

## 2020-05-31 ENCOUNTER — Other Ambulatory Visit: Payer: Self-pay | Admitting: Family Medicine

## 2020-05-31 MED ORDER — ESCITALOPRAM OXALATE 20 MG PO TABS: 20.0000 mg | ORAL_TABLET | Freq: Every day | ORAL | 3 refills | Status: DC

## 2020-09-15 ENCOUNTER — Encounter: Payer: Self-pay | Admitting: Gastroenterology

## 2020-12-03 ENCOUNTER — Encounter: Payer: Self-pay | Admitting: Family Medicine

## 2020-12-10 ENCOUNTER — Encounter: Payer: Self-pay | Admitting: Family Medicine

## 2020-12-10 ENCOUNTER — Ambulatory Visit (INDEPENDENT_AMBULATORY_CARE_PROVIDER_SITE_OTHER): Payer: Self-pay | Admitting: Family Medicine

## 2020-12-10 ENCOUNTER — Other Ambulatory Visit: Payer: Self-pay

## 2020-12-10 VITALS — BP 130/64 | HR 72 | Temp 97.9°F | Resp 16 | Ht 69.0 in | Wt 218.0 lb

## 2020-12-10 DIAGNOSIS — R195 Other fecal abnormalities: Secondary | ICD-10-CM

## 2020-12-10 LAB — COMPLETE METABOLIC PANEL WITH GFR
AG Ratio: 1.4 (calc) (ref 1.0–2.5)
ALT: 13 U/L (ref 6–29)
AST: 15 U/L (ref 10–35)
Albumin: 4.2 g/dL (ref 3.6–5.1)
Alkaline phosphatase (APISO): 74 U/L (ref 37–153)
BUN: 16 mg/dL (ref 7–25)
CO2: 29 mmol/L (ref 20–32)
Calcium: 9.3 mg/dL (ref 8.6–10.4)
Chloride: 104 mmol/L (ref 98–110)
Creat: 1.04 mg/dL (ref 0.50–1.05)
Globulin: 3 g/dL (calc) (ref 1.9–3.7)
Glucose, Bld: 68 mg/dL (ref 65–99)
Potassium: 4.4 mmol/L (ref 3.5–5.3)
Sodium: 139 mmol/L (ref 135–146)
Total Bilirubin: 0.5 mg/dL (ref 0.2–1.2)
Total Protein: 7.2 g/dL (ref 6.1–8.1)
eGFR: 59 mL/min/{1.73_m2} — ABNORMAL LOW (ref >=60)

## 2020-12-10 LAB — CBC WITH DIFFERENTIAL/PLATELET
Absolute Monocytes: 453 cells/uL (ref 200–950)
Basophils Absolute: 19 cells/uL (ref 0–200)
Basophils Relative: 0.3 %
Eosinophils Absolute: 112 cells/uL (ref 15–500)
Eosinophils Relative: 1.8 %
HCT: 42.1 % (ref 35.0–45.0)
Hemoglobin: 14.1 g/dL (ref 11.7–15.5)
Lymphs Abs: 1761 cells/uL (ref 850–3900)
MCH: 31.5 pg (ref 27.0–33.0)
MCHC: 33.5 g/dL (ref 32.0–36.0)
MCV: 94.2 fL (ref 80.0–100.0)
MPV: 10.4 fL (ref 7.5–12.5)
Monocytes Relative: 7.3 %
Neutro Abs: 3856 cells/uL (ref 1500–7800)
Neutrophils Relative %: 62.2 %
Platelets: 241 10*3/uL (ref 140–400)
RBC: 4.47 10*6/uL (ref 3.80–5.10)
RDW: 11.9 % (ref 11.0–15.0)
Total Lymphocyte: 28.4 %
WBC: 6.2 10*3/uL (ref 3.8–10.8)

## 2020-12-10 NOTE — Progress Notes (Signed)
Subjective:    Patient ID: Kathryn Zavala, female    DOB: 1955-04-07, 65 y.o.   MRN: [id number]  Patient is a very pleasant 65 year old African-American female who presents today with several weeks of green-colored stool.  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  Her recent colonoscopy August of last year showed no evidence of any recurrence.  Today on examination she is not jaundiced.  There is no scleral icterus.  There is no asterixis.  Overall she is doing well with no concerns or abdominal pain.  She has been gaining weight actually.  However she admits to drinking grape Kool-Aid on a daily basis.  Therefore I believe that the change in her stool could be due to the dyes in the Kool-Aid Past Medical History:  Diagnosis Date   Cancer [facility])    2010 Colon with mets to the liver, partial colectomy, chemo, and liver met resection   Cataract    Left leg pain    numbness   Stroke ([place])    Pt reports that she thinks she had a stroke in her late 61s, early 40s.    Past Surgical History:  Procedure Laterality Date   ABDOMINAL HYSTERECTOMY     due to fibroids at 1, [facility] BSO   COLON SURGERY     COLONOSCOPY     JOINT REPLACEMENT     TKR x 2 on left knee   LIVER SURGERY     Current Outpatient Medications on File Prior to Visit  Medication Sig Dispense Refill   acetaminophen (TYLENOL) 500 MG tablet Take 500 mg by mouth every 6 (six) hours as needed.     escitalopram (LEXAPRO) 20 MG tablet Take 1 tablet (20 mg total) by mouth daily. 90 tablet 3   ibuprofen (ADVIL) 600 MG tablet Take 1 tablet (600 mg total) by mouth every 6 (six) hours as needed. 30 tablet 0   No current facility-administered medications on file prior to visit.   Allergies  Allergen Reactions   Duloxetine Diarrhea, Nausea And Vomiting and Other (See Comments)    red blisters on face and arms   Social History   Socioeconomic History   Marital  status: Soil scientist    Spouse name: Not on file   Number of children: 5   Years of education: 12   Highest education level: High school graduate  Occupational History   Occupation: Patent attorney  Tobacco Use   Smoking status: Former    Types: Cigarettes   Smokeless tobacco: Never   Tobacco comments:    high school days  Vaping Use   Vaping Use: Never used  Substance and Sexual Activity   Alcohol use: Yes    Comment: beer every other day   Drug use: No   Sexual activity: Not on file  Other Topics Concern   Not on file  Social History Narrative   Lives at home with her domestic partner, Peter Congo.   Right-handed.   Two cups coffee per day.   Social Determinants of Health   Financial Resource Strain: Not on file  Food Insecurity: Not on file  Transportation Needs: Not on file  Physical Activity: Not on file  Stress: Not on file  Social Connections: Not on file  Intimate Partner Violence: Not on file   Family History  Problem Relation Age of Onset   Cancer Mother  unsure of type   Cancer Father        unsure of type   Colon cancer Neg Hx    Esophageal cancer Neg Hx    Rectal cancer Neg Hx    Stomach cancer Neg Hx       Review of Systems  All other systems reviewed and are negative.     Objective:   Physical Exam Vitals reviewed.  Constitutional:      General: She is not in acute distress.    Appearance: She is well-developed. She is not diaphoretic.  HENT:     Head: Normocephalic and atraumatic.  Neck:     Thyroid: No thyromegaly.     Vascular: No JVD.     Trachea: No tracheal deviation.  Cardiovascular:     Rate and Rhythm: Normal rate and regular rhythm.     Heart sounds: Normal heart sounds. No murmur heard.   No friction rub. No gallop.  Pulmonary:     Effort: Pulmonary effort is normal. No respiratory distress.     Breath sounds: Normal breath sounds. No stridor. No wheezing or rales.  Chest:     Chest wall: No tenderness.   Neurological:     Mental Status: She is alert.     Motor: No abnormal muscle tone.  Psychiatric:        Mood and Affect: Mood normal.        Behavior: Behavior normal.        Thought Content: Thought content normal.        Judgment: Judgment normal.          Assessment & Plan:  Green stool - Plan: COMPLETE METABOLIC PANEL WITH GFR, CBC with Differential/Platelet I believe the change in her stool that is due to the diet and the Kool-Aid.  I have asked the patient to stop Kool-Aid as this is likely contributing to her weight gain.  I believe that the color change to her stool will discontinue once she gets off the grape Kool-Aid.  I will check a CBC and a CMP today to check her bilirubin levels and her electrolytes.  If the changes subside after she discontinues the Kool-Aid, no further work-up is necessary

## 2021-02-22 ENCOUNTER — Encounter: Payer: Self-pay | Admitting: Family Medicine

## 2021-02-24 ENCOUNTER — Other Ambulatory Visit: Payer: Self-pay

## 2021-02-24 ENCOUNTER — Ambulatory Visit (INDEPENDENT_AMBULATORY_CARE_PROVIDER_SITE_OTHER): Payer: Self-pay | Admitting: Family Medicine

## 2021-02-24 ENCOUNTER — Encounter: Payer: Self-pay | Admitting: Family Medicine

## 2021-02-24 VITALS — BP 118/98 | HR 60 | Temp 97.2°F | Resp 18 | Ht 69.0 in | Wt 205.0 lb

## 2021-02-24 DIAGNOSIS — L723 Sebaceous cyst: Secondary | ICD-10-CM

## 2021-02-24 MED ORDER — HYDROCODONE-ACETAMINOPHEN 5-325 MG PO TABS: 1.0000 | ORAL_TABLET | Freq: Four times a day (QID) | ORAL | 0 refills | Status: DC | PRN

## 2021-02-24 MED ORDER — SULFAMETHOXAZOLE-TRIMETHOPRIM 800-160 MG PO TABS: 1.0000 | ORAL_TABLET | Freq: Two times a day (BID) | ORAL | 0 refills | Status: DC

## 2021-02-24 NOTE — Progress Notes (Signed)
Subjective:    Patient ID: Kathryn Zavala, female    DOB: Nov 14, 1955, 66 y.o.   MRN: [id number]  Patient has had a painful enlarging red tender spot "boil" on her right labia for several days.  Is extremely tender to the touch.  It is partially drained.  She denies any fevers or chills.  With a chaperone present I inspected the area.  There is a 6 mm opening.  There is a visible cyst sac partially protruding from the opening.  It appears that this was an infected/inflamed sebaceous cyst. Past Medical History:  Diagnosis Date   Cancer ([place])    2010 Colon with mets to the liver, partial colectomy, chemo, and liver met resection   Cataract    Left leg pain    numbness   Stroke ([place])    Pt reports that she thinks she had a stroke in her late 9s, early 68s.    Past Surgical History:  Procedure Laterality Date   ABDOMINAL HYSTERECTOMY     due to fibroids at 28, [facility] BSO   COLON SURGERY     COLONOSCOPY     JOINT REPLACEMENT     TKR x 2 on left knee   LIVER SURGERY     Current Outpatient Medications on File Prior to Visit  Medication Sig Dispense Refill   acetaminophen (TYLENOL) 500 MG tablet Take 500 mg by mouth every 6 (six) hours as needed.     escitalopram (LEXAPRO) 20 MG tablet Take 1 tablet (20 mg total) by mouth daily. 90 tablet 3   ibuprofen (ADVIL) 600 MG tablet Take 1 tablet (600 mg total) by mouth every 6 (six) hours as needed. 30 tablet 0   No current facility-administered medications on file prior to visit.   Allergies  Allergen Reactions   Duloxetine Diarrhea, Nausea And Vomiting and Other (See Comments)    red blisters on face and arms   Social History   Socioeconomic History   Marital status: Soil scientist    Spouse name: Not on file   Number of children: 5   Years of education: 12   Highest education level: High school graduate  Occupational History   Occupation: Patent attorney  Tobacco Use   Smoking status: Former    Types: Cigarettes    Smokeless tobacco: Never   Tobacco comments:    high school days  Vaping Use   Vaping Use: Never used  Substance and Sexual Activity   Alcohol use: Yes    Comment: beer every other day   Drug use: No   Sexual activity: Not on file  Other Topics Concern   Not on file  Social History Narrative   Lives at home with her domestic partner, Peter Congo.   Right-handed.   Two cups coffee per day.   Social Determinants of Health   Financial Resource Strain: Not on file  Food Insecurity: Not on file  Transportation Needs: Not on file  Physical Activity: Not on file  Stress: Not on file  Social Connections: Not on file  Intimate Partner Violence: Not on file   Family History  Problem Relation Age of Onset   Cancer Mother        unsure of type   Cancer Father        unsure of type   Colon cancer Neg Hx    Esophageal cancer Neg Hx    Rectal cancer Neg Hx    Stomach cancer Neg Hx  Review of Systems  All other systems reviewed and are negative.     Objective:   Physical Exam Vitals reviewed.  Constitutional:      General: She is not in acute distress.    Appearance: She is well-developed. She is not diaphoretic.  HENT:     Head: Normocephalic and atraumatic.  Neck:     Thyroid: No thyromegaly.     Vascular: No JVD.     Trachea: No tracheal deviation.  Cardiovascular:     Rate and Rhythm: Normal rate and regular rhythm.     Heart sounds: Normal heart sounds. No murmur heard.   No friction rub. No gallop.  Pulmonary:     Effort: Pulmonary effort is normal. No respiratory distress.     Breath sounds: Normal breath sounds. No stridor. No wheezing or rales.  Chest:     Chest wall: No tenderness.  Genitourinary:   Neurological:     Mental Status: She is alert.     Motor: No abnormal muscle tone.  Psychiatric:        Mood and Affect: Mood normal.        Behavior: Behavior normal.        Thought Content: Thought content normal.        Judgment: Judgment normal.     Cyst is 2.5 cm in diameter      Assessment & Plan:  Inflamed sebaceous cyst I anesthetized the area with 0.1% lidocaine without epinephrine.  I enlarged the opening with a scalpel.  Using a pair of hemostats I was able to grasp the cyst sac and bluntly dissected from the surrounding tissue within the cavity.  I was then able to remove the entire cyst sac in its entirety although it had ruptured and drained its contents previously.  I then cleaned and probed the wound with Q-tip soaked in peroxide.  Begin Bactrim double strength tablets twice daily for 5 days.  Recommended warm sitz bath's daily and discussed wound care.  Anticipate closure through secondary intention over the next 5 to 7 days

## 2021-03-31 ENCOUNTER — Other Ambulatory Visit: Payer: Self-pay | Admitting: Family Medicine

## 2021-03-31 MED ORDER — NIRMATRELVIR/RITONAVIR (PAXLOVID)TABLET: 3.0000 | ORAL_TABLET | Freq: Two times a day (BID) | ORAL | 0 refills | Status: AC

## 2021-04-20 ENCOUNTER — Telehealth: Payer: Self-pay | Admitting: Family Medicine

## 2021-04-20 NOTE — Telephone Encounter (Signed)
Left message for patient to call back and schedule Medicare Annual Wellness Visit (AWV) in office.  ? ?If not able to come in office, please offer to do virtually or by telephone.  Left office number and my jabber [phone]. ? ?Last AWV:07/07/2013 ? ?Please schedule at anytime with Nurse Health Advisor. ?  ?

## 2021-04-22 ENCOUNTER — Other Ambulatory Visit: Payer: Self-pay

## 2021-04-22 ENCOUNTER — Ambulatory Visit (INDEPENDENT_AMBULATORY_CARE_PROVIDER_SITE_OTHER): Payer: Medicare Other

## 2021-04-22 VITALS — Ht 69.0 in | Wt 205.0 lb

## 2021-04-22 DIAGNOSIS — Z0000 Encounter for general adult medical examination without abnormal findings: Secondary | ICD-10-CM

## 2021-04-22 DIAGNOSIS — Z1231 Encounter for screening mammogram for malignant neoplasm of breast: Secondary | ICD-10-CM | POA: Diagnosis not present

## 2021-04-22 NOTE — Patient Instructions (Signed)
[name] , ?Thank you for taking time to come for your Medicare Wellness Visit. I appreciate your ongoing commitment to your health goals. Please review the following plan we discussed and let me know if I can assist you in the future.  ? ?Screening recommendations/referrals: ?Colonoscopy: Done 10/02/2019. Check with Gastroenterologist to see when follow up is needed. ? ?Mammogram: Order placed today to schedule.  ? ?Bone Density: Due after age 67. ? ?Recommended yearly ophthalmology/optometry visit for glaucoma screening and checkup ?Recommended yearly dental visit for hygiene and checkup ? ?Vaccinations: ?Influenza vaccine: Due Repeat annually ? ?Pneumococcal vaccine: Discussed. ?Tdap vaccine: Due. Repeat in 10 years ? ?Shingles vaccine: Discussed.   ?Covid-19:Done 04/19/2019, 05/10/2019 and 11/13/2019 ? ?Advanced directives: Please bring a copy of your health care power of attorney and living will to the office to be added to your chart at your convenience. ? ? ?Conditions/risks identified: KEEP UP THE GOOD WORK!! ? ?Next appointment: Follow up in one year for your annual wellness visit 2024. ? ? ?Preventive Care 78 Years and Older, Female ?Preventive care refers to lifestyle choices and visits with your health care provider that can promote health and wellness. ?What does preventive care include? ?A yearly physical exam. This is also called an annual well check. ?Dental exams once or twice a year. ?Routine eye exams. Ask your health care provider how often you should have your eyes checked. ?Personal lifestyle choices, including: ?Daily care of your teeth and gums. ?Regular physical activity. ?Eating a healthy diet. ?Avoiding tobacco and drug use. ?Limiting alcohol use. ?Practicing safe sex. ?Taking low-dose aspirin every day. ?Taking vitamin and mineral supplements as recommended by your health care provider. ?What happens during an annual well check? ?The services and screenings done by your health care  provider during your annual well check will depend on your age, overall health, lifestyle risk factors, and family history of disease. ?Counseling  ?Your health care provider may ask you questions about your: ?Alcohol use. ?Tobacco use. ?Drug use. ?Emotional well-being. ?Home and relationship well-being. ?Sexual activity. ?Eating habits. ?History of falls. ?Memory and ability to understand (cognition). ?Work and work Statistician. ?Reproductive health. ?Screening  ?You may have the following tests or measurements: ?Height, weight, and BMI. ?Blood pressure. ?Lipid and cholesterol levels. These may be checked every 5 years, or more frequently if you are over 49 years old. ?Skin check. ?Lung cancer screening. You may have this screening every year starting at age 70 if you have a 30-pack-year history of smoking and currently smoke or have quit within the past 15 years. ?Fecal occult blood test (FOBT) of the stool. You may have this test every year starting at age 17. ?Flexible sigmoidoscopy or colonoscopy. You may have a sigmoidoscopy every 5 years or a colonoscopy every 10 years starting at age 14. ?Hepatitis C blood test. ?Hepatitis B blood test. ?Sexually transmitted disease (STD) testing. ?Diabetes screening. This is done by checking your blood sugar (glucose) after you have not eaten for a while (fasting). You may have this done every 1-3 years. ?Bone density scan. This is done to screen for osteoporosis. You may have this done starting at age 11. ?Mammogram. This may be done every 1-2 years. Talk to your health care provider about how often you should have regular mammograms. ?Talk with your health care provider about your test results, treatment options, and if necessary, the need for more tests. ?Vaccines  ?Your health care provider may recommend certain vaccines, such as: ?Influenza vaccine. This is  recommended every year. ?Tetanus, diphtheria, and acellular pertussis (Tdap, Td) vaccine. You may need a Td  booster every 10 years. ?Zoster vaccine. You may need this after age 37. ?Pneumococcal 13-valent conjugate (PCV13) vaccine. One dose is recommended after age 7. ?Pneumococcal polysaccharide (PPSV23) vaccine. One dose is recommended after age 35. ?Talk to your health care provider about which screenings and vaccines you need and how often you need them. ?This information is not intended to replace advice given to you by your health care provider. Make sure you discuss any questions you have with your health care provider. ?Document Released: 02/19/2015 Document Revised: 10/13/2015 Document Reviewed: 11/24/2014 ?Elsevier Interactive Patient Education ? 2017 [place]. ? ?Fall Prevention in the Home ?Falls can cause injuries. They can happen to people of all ages. There are many things you can do to make your home safe and to help prevent falls. ?What can I do on the outside of my home? ?Regularly fix the edges of walkways and driveways and fix any cracks. ?Remove anything that might make you trip as you walk through a door, such as a raised step or threshold. ?Trim any bushes or trees on the path to your home. ?Use bright outdoor lighting. ?Clear any walking paths of anything that might make someone trip, such as rocks or tools. ?Regularly check to see if handrails are loose or broken. Make sure that both sides of any steps have handrails. ?Any raised decks and porches should have guardrails on the edges. ?Have any leaves, snow, or ice cleared regularly. ?Use sand or salt on walking paths during winter. ?Clean up any spills in your garage right away. This includes oil or grease spills. ?What can I do in the bathroom? ?Use night lights. ?Install grab bars by the toilet and in the tub and shower. Do not use towel bars as grab bars. ?Use non-skid mats or decals in the tub or shower. ?If you need to sit down in the shower, use a plastic, non-slip stool. ?Keep the floor dry. Clean up any water that spills on the floor  as soon as it happens. ?Remove soap buildup in the tub or shower regularly. ?Attach bath mats securely with double-sided non-slip rug tape. ?Do not have throw rugs and other things on the floor that can make you trip. ?What can I do in the bedroom? ?Use night lights. ?Make sure that you have a light by your bed that is easy to reach. ?Do not use any sheets or blankets that are too big for your bed. They should not hang down onto the floor. ?Have a firm chair that has side arms. You can use this for support while you get dressed. ?Do not have throw rugs and other things on the floor that can make you trip. ?What can I do in the kitchen? ?Clean up any spills right away. ?Avoid walking on wet floors. ?Keep items that you use a lot in easy-to-reach places. ?If you need to reach something above you, use a strong step stool that has a grab bar. ?Keep electrical cords out of the way. ?Do not use floor polish or wax that makes floors slippery. If you must use wax, use non-skid floor wax. ?Do not have throw rugs and other things on the floor that can make you trip. ?What can I do with my stairs? ?Do not leave any items on the stairs. ?Make sure that there are handrails on both sides of the stairs and use them. Fix handrails that are  broken or loose. Make sure that handrails are as long as the stairways. ?Check any carpeting to make sure that it is firmly attached to the stairs. Fix any carpet that is loose or worn. ?Avoid having throw rugs at the top or bottom of the stairs. If you do have throw rugs, attach them to the floor with carpet tape. ?Make sure that you have a light switch at the top of the stairs and the bottom of the stairs. If you do not have them, ask someone to add them for you. ?What else can I do to help prevent falls? ?Wear shoes that: ?Do not have high heels. ?Have rubber bottoms. ?Are comfortable and fit you well. ?Are closed at the toe. Do not wear sandals. ?If you use a stepladder: ?Make sure that it is  fully opened. Do not climb a closed stepladder. ?Make sure that both sides of the stepladder are locked into place. ?Ask someone to hold it for you, if possible. ?Clearly mark and make sure that you can see: ?

## 2021-04-22 NOTE — Progress Notes (Signed)
? ?Subjective:  ? Kathryn Zavala is a 66 y.o. female who presents for Medicare Annual (Subsequent) preventive examination. ?Virtual Visit via Telephone Note ? ?I connected with  Kathryn Zavala on 04/22/21 at  9:00 AM EDT by telephone and verified that I am speaking with the correct person using two identifiers. ? ?Location: ?Patient: HOME ?Provider: [facility] ?Persons participating in the virtual visit: patient/Nurse Health Advisor ?  ?I discussed the limitations, risks, security and privacy concerns of performing an evaluation and management service by telephone and the availability of in person appointments. The patient expressed understanding and agreed to proceed. ? ?Interactive audio and video telecommunications were attempted between this nurse and patient, however failed, due to patient having technical difficulties OR patient did not have access to video capability.  We continued and completed visit with audio only. ? ?Some vital signs may be absent or patient reported.  ? ?Chriss Driver, LPN ? ?Review of Systems    ? ?Cardiac Risk Factors include: advanced age (>45mn, >>14women);sedentary lifestyle;obesity (BMI >30kg/m2);Other (see comment), Risk factor comments: Hx of Colon ca with mets to liver, CVA ? ?   ?Objective:  ?  ?Today's Vitals  ? 04/22/21 0903  ?Weight: 205 lb (93 kg)  ?Height: _0  (1.753 m)  ? ?Body mass index is 30.27 kg/m?. ? ?Advanced Directives 04/22/2021 06/02/2019 10/05/2018 05/19/2016  ?Does Patient Have a Medical Advance Directive? No No No No  ?Would patient like information on creating a medical advance directive? No - Patient declined No - Patient declined - No - Patient declined  ? ? ?Current Medications (verified) ?Outpatient Encounter Medications as of 04/22/2021  ?Medication Sig  ? acetaminophen (TYLENOL) 500 MG tablet Take 500 mg by mouth every 6 (six) hours as needed.  ? escitalopram (LEXAPRO) 20 MG tablet Take 1 tablet (20 mg total) by mouth daily.  ?  HYDROcodone-acetaminophen (NORCO) 5-325 MG tablet Take 1 tablet by mouth every 6 (six) hours as needed for moderate pain.  ? ibuprofen (ADVIL) 600 MG tablet Take 1 tablet (600 mg total) by mouth every 6 (six) hours as needed.  ? sulfamethoxazole-trimethoprim (BACTRIM DS) 800-160 MG tablet Take 1 tablet by mouth 2 (two) times daily.  ? ?No facility-administered encounter medications on file as of 04/22/2021.  ? ? ?Allergies (verified) ?Duloxetine  ? ?History: ?Past Medical History:  ?Diagnosis Date  ? Cancer ([facility]   ? 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 Cataract   ? Left leg pain   ? numbness  ? Stroke ([facility]   ? Pt reports that she thinks she had a stroke in her late 268s early 318s  ? ?Past Surgical History:  ?Procedure Laterality Date  ? ABDOMINAL HYSTERECTOMY    ? due to fibroids at 35, [facility] BSO  ? COLON SURGERY    ? COLONOSCOPY    ? JOINT REPLACEMENT    ? TKR x 2 on left knee  ? LIVER SURGERY    ? ?Family History  ?Problem Relation Age of Onset  ? Cancer Mother   ?     unsure of type  ? Cancer Father   ?     unsure of type  ? Colon cancer Neg Hx   ? Esophageal cancer Neg Hx   ? Rectal cancer Neg Hx   ? Stomach cancer Neg Hx   ? ?Social History  ? ?Socioeconomic History  ? Marital status: DSoil scientist ?  Spouse  name: Peter Congo  ? Number of children: 5  ? Years of education: 42  ? Highest education level: High school graduate  ?Occupational History  ? Occupation: Patent attorney  ?Tobacco Use  ? Smoking status: Former  ?  Types: Cigarettes  ? Smokeless tobacco: Never  ? Tobacco comments:  ?  high school days  ?Vaping Use  ? Vaping Use: Never used  ?Substance and Sexual Activity  ? Alcohol use: Yes  ?  Comment: beer every other day  ? Drug use: No  ? Sexual activity: Not on file  ?Other Topics Concern  ? Not on file  ?Social History Narrative  ? Lives at home with her domestic partner, Peter Congo.  ? Right-handed.  ? Two cups coffee per day.  ? Works at [employer].  ? 11 grandchildren.  ? ?Social Determinants of Health  ? ?Financial Resource Strain: Low Risk   ? Difficulty of Paying Living Expenses: Not hard at all  ?Food Insecurity: No Food Insecurity  ? Worried About Charity fundraiser in the Last Year: Never true  ? Ran Out of Food in the Last Year: Never true  ?Transportation Needs: No Transportation Needs  ? Lack of Transportation (Medical): No  ? Lack of Transportation (Non-Medical): No  ?Physical Activity: Sufficiently Active  ? Days of Exercise per Week: 7 days  ? Minutes of Exercise per Session: 30 min  ?Stress: No Stress Concern Present  ? Feeling of Stress : Not at all  ?Social Connections: Socially Integrated  ? Frequency of Communication with Friends and Family: More than three times a week  ? Frequency of Social Gatherings with Friends and Family: More than three times a week  ? Attends Religious Services: More than 4 times per year  ? Active Member of Clubs or Organizations: Yes  ? Attends Archivist Meetings: More than 4 times per year  ? Marital Status: Married  ? ? ?Tobacco Counseling ?Counseling given: Not Answered ?Tobacco comments: high school days ? ? ?Clinical Intake: ? ?Pre-visit preparation completed: Yes ? ?Pain : No/denies pain ? ?  ? ?BMI - recorded: 30.27 ?Nutritional Status: BMI > 30  Obese ?Nutritional Risks: None ?Diabetes: No ? ?How often do you need to have someone help you when you read instructions, pamphlets, or other written materials from your doctor or pharmacy?: 1 - Never ? ?Diabetic?No ? ?Interpreter Needed?: No ? ?Information entered by :: mj Yuriy Cui, lpn ? ? ?Activities of Daily Living ?In your present state of health, do you have any difficulty performing the following activities: 04/22/2021  ?Hearing? N  ?Vision? N  ?Difficulty concentrating or making decisions? N  ?Walking or climbing stairs? N  ?Dressing or bathing? N  ?Doing errands, shopping? N  ?Preparing Food and eating ? N  ?Using the Toilet? N  ?In the  past six months, have you accidently leaked urine? N  ?Do you have problems with loss of bowel control? N  ?Managing your Medications? N  ?Managing your Finances? N  ?Housekeeping or managing your Housekeeping? N  ?Some recent data might be hidden  ? ? ?Patient Care Team: ?Susy Frizzle, MD as PCP - General (Family Medicine) ?Jonnie Finner, RN (Inactive) as Oncology Nurse Navigator ?Jonnie Finner, RN (Inactive) as Oncology Nurse Navigator ?Truitt Merle, MD as Consulting Physician (Oncology) ?Thornton Park, MD as Consulting Physician (Gastroenterology) ? ?Indicate any recent Medical Services you may have received from other than [facility] providers in the past year (  date may be approximate). ? ?   ?Assessment:  ? This is a routine wellness examination for Kimyetta. ? ?Hearing/Vision screen ?Hearing Screening - Comments:: No hearing issues.  ?Vision Screening - Comments:: Glasses. Dr. Nat Christen. ? ?Dietary issues and exercise activities discussed: ?Current Exercise Habits: Home exercise routine, Type of exercise: walking, Time (Minutes): 30, Frequency (Times/Week): 7, Weekly Exercise (Minutes/Week): 210, Intensity: Mild, Exercise limited by: cardiac condition(s) ? ? Goals Addressed   ? ?  ?  ?  ?  ? This Visit's Progress  ?  Weight (lb) < 200 lb (90.7 kg)   205 lb (93 kg)  ?  Increase exercise.  ?Spend time with kids and grandchildren.  ?  ? ?  ? ?Depression Screen ?PHQ 2/9 Scores 04/22/2021 12/10/2020 02/27/2020 09/24/2017 01/02/2015  ?PHQ - 2 Score 0 0 4 0 0  ?PHQ- 9 Score - 4 13 - -  ?  ?Fall Risk ?Fall Risk  04/22/2021 12/10/2020 02/27/2020 09/24/2017  ?Falls in the past year? 0 0 0 No  ?Number falls in past yr: 0 0 - -  ?Injury with Fall? 0 0 - -  ?Risk for fall due to : No Fall Risks No Fall Risks No Fall Risks -  ?Follow up Falls prevention discussed Falls evaluation completed Falls evaluation completed -  ? ? ?FALL RISK PREVENTION PERTAINING TO THE HOME: ? ?Any stairs in or around the home? Yes  ?If so, are  there any without handrails? No  ?Home free of loose throw rugs in walkways, pet beds, electrical cords, etc? Yes  ?Adequate lighting in your home to reduce risk of falls? Yes  ? ?ASSISTIVE DEVICES UTILIZED TO PREVEN

## 2021-04-25 ENCOUNTER — Encounter: Payer: Self-pay | Admitting: *Deleted

## 2021-04-29 ENCOUNTER — Ambulatory Visit
Admission: RE | Admit: 2021-04-29 | Discharge: 2021-04-29 | Disposition: A | Discharge status: Home / Self Care | Payer: Medicare Other | Source: Ambulatory Visit | Attending: Family Medicine | Admitting: Family Medicine

## 2021-04-29 ENCOUNTER — Other Ambulatory Visit: Payer: Self-pay

## 2021-04-29 DIAGNOSIS — Z1231 Encounter for screening mammogram for malignant neoplasm of breast: Secondary | ICD-10-CM

## 2021-04-29 IMAGING — MG MM DIGITAL SCREENING BILAT W/ TOMO AND CAD
8 series · 9 of 24 positions shown · non-contrast
Comparison: Previous exam(s).

CLINICAL DATA: Screening.

EXAM:
DIGITAL SCREENING BILATERAL MAMMOGRAM WITH TOMOSYNTHESIS AND CAD
TECHNIQUE: Bilateral screening digital craniocaudal and mediolateral oblique
mammograms were obtained. Bilateral screening digital breast
tomosynthesis was performed. The images were evaluated with
computer-aided detection.

[L CC synth-2D]
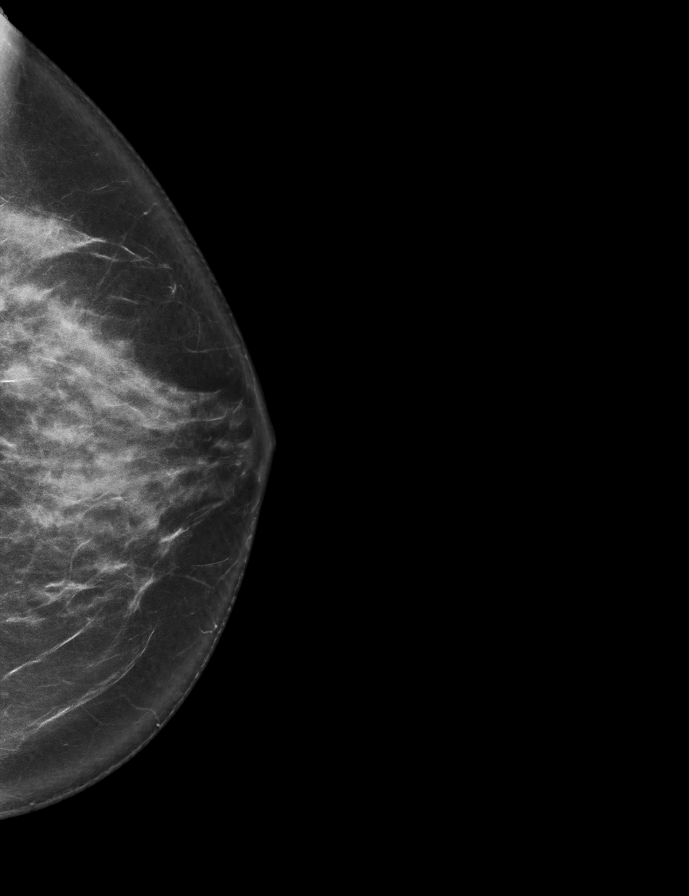

[L MLO synth-2D]
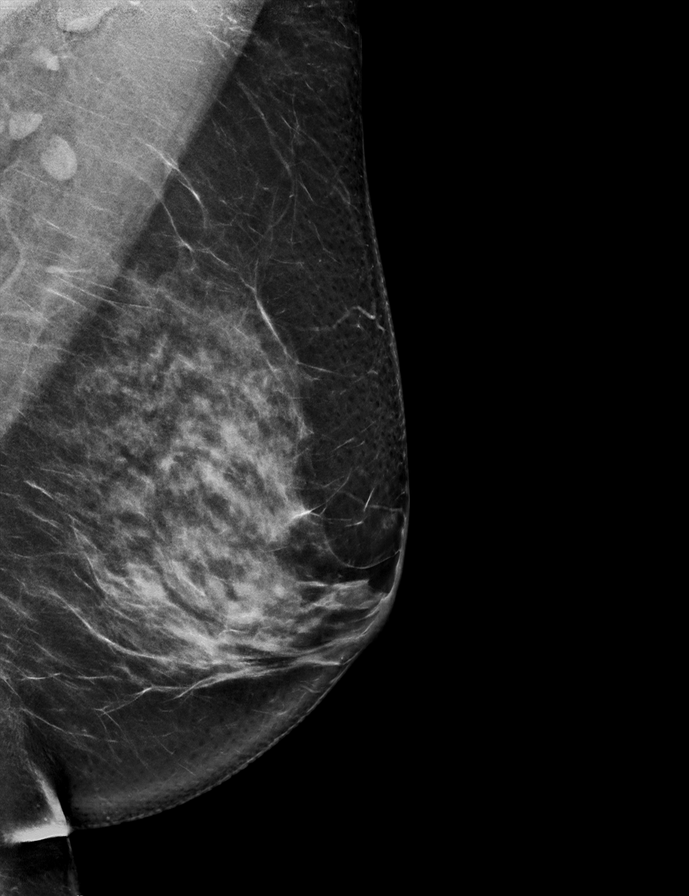

[R MLO synth-2D]
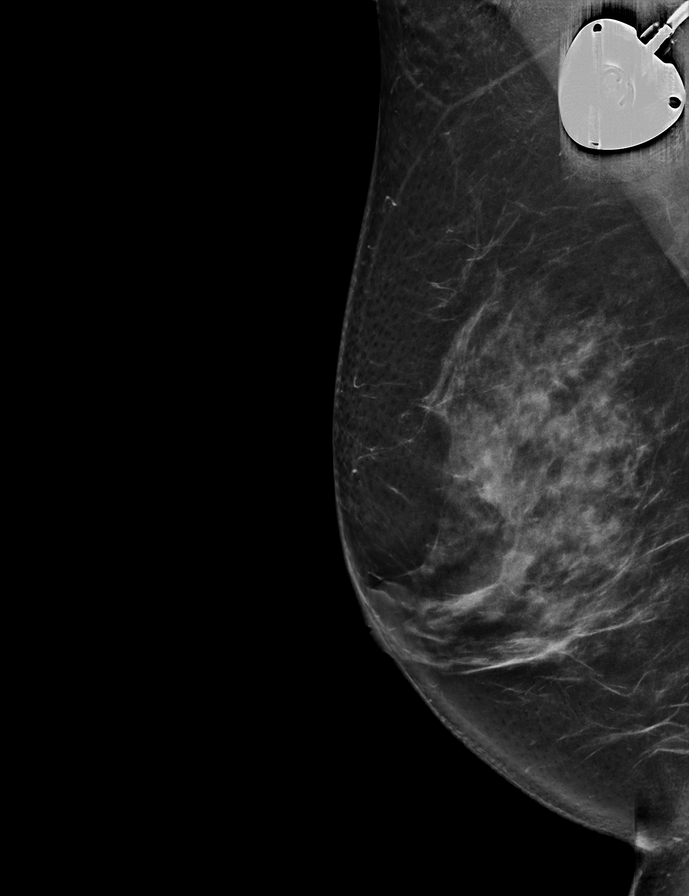

[R CC synth-2D]
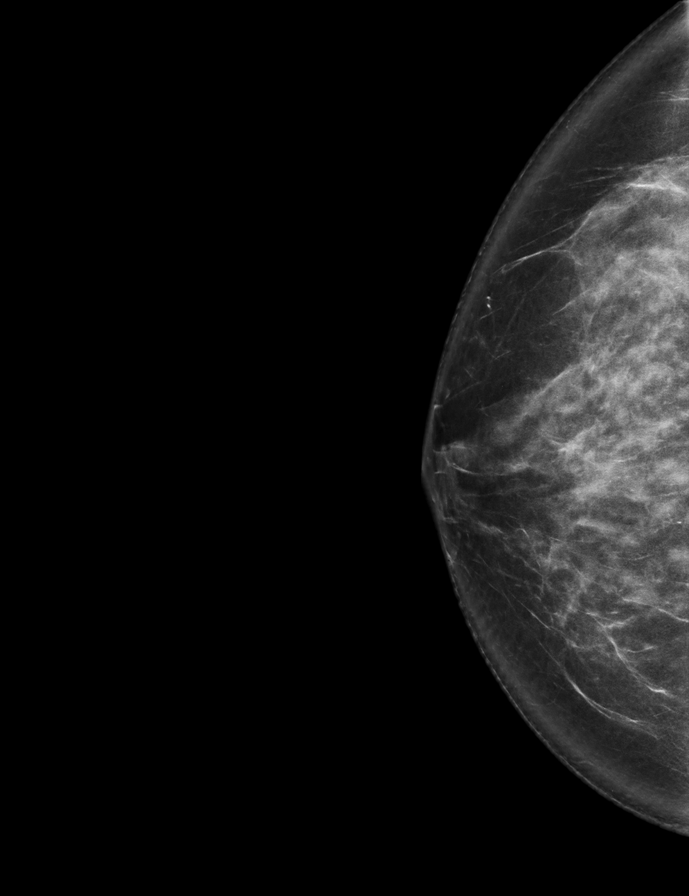

[L MLO tomo · 2 of 84 frames shown]
[frame 28/84]
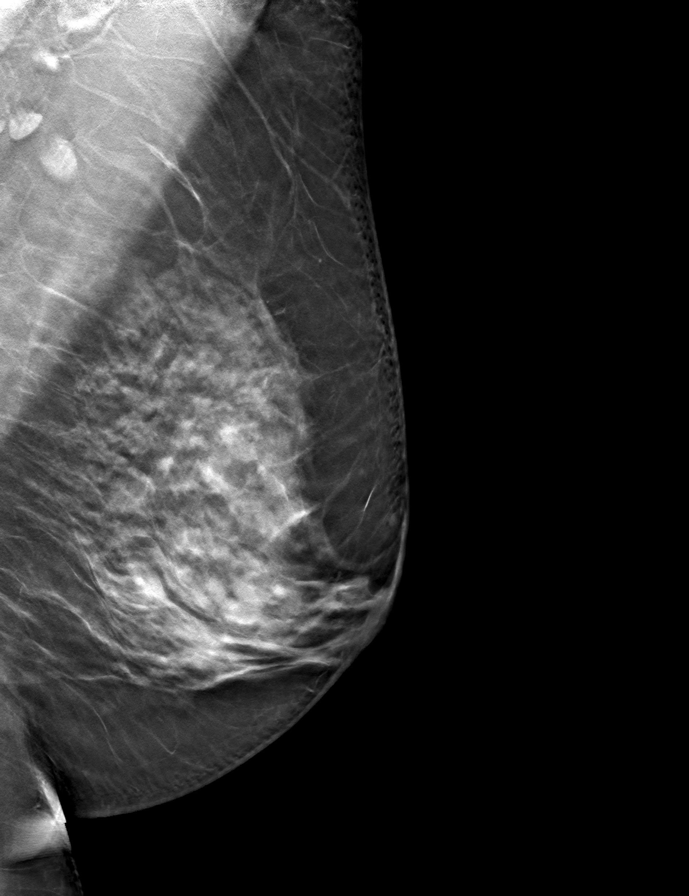
[frame 43/84]
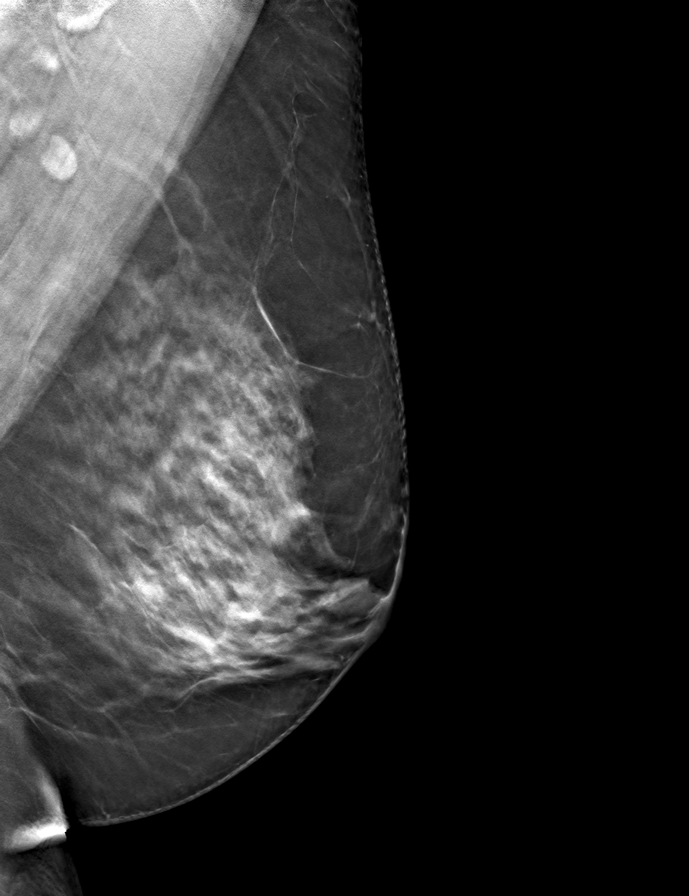

[R CC tomo · tomo slice 41/82.0]
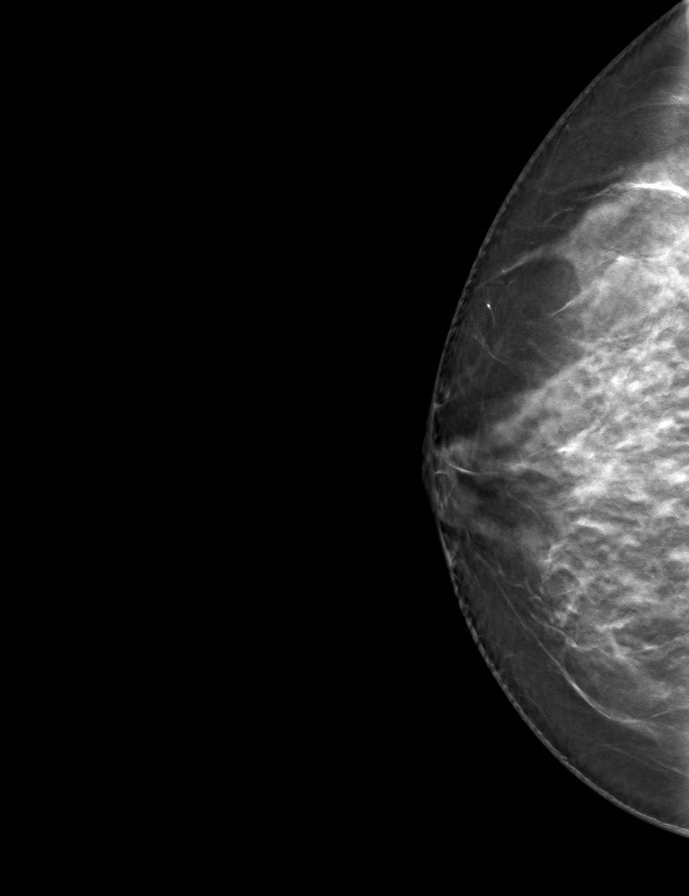

[R MLO tomo · tomo slice 43/84.0]
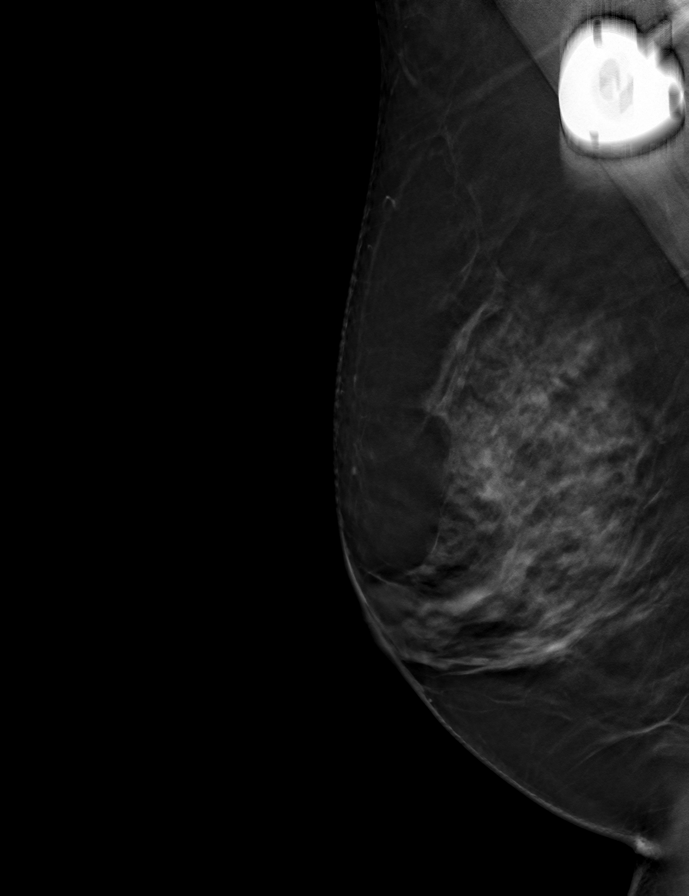

[L CC tomo · tomo slice 41/81.0]
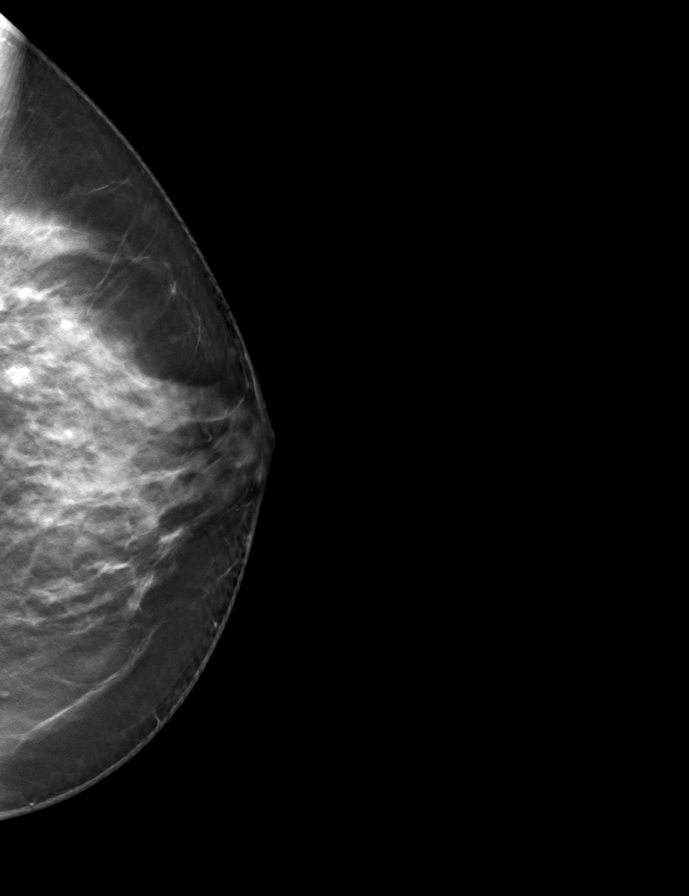

[9 of 24 positions shown; findings below may reference images not displayed]

ACR Breast Density Category c: The breast tissue is heterogeneously
dense, which may obscure small masses.
FINDINGS: There are no findings suspicious for malignancy.
IMPRESSION: No mammographic evidence of malignancy. A result letter of this
screening mammogram will be mailed directly to the patient.

RECOMMENDATION:
Screening mammogram in one year. (Code:[V2])

BI-RADS CATEGORY  1: Negative.

## 2021-05-21 ENCOUNTER — Encounter: Payer: Self-pay | Admitting: Family Medicine

## 2021-05-23 NOTE — Telephone Encounter (Signed)
Call pt and made an appt for 05/26/21 at 10:15 to discuss issues with Dr. Dennard Schaumann ?

## 2021-05-26 ENCOUNTER — Other Ambulatory Visit: Payer: Self-pay

## 2021-05-26 ENCOUNTER — Ambulatory Visit (INDEPENDENT_AMBULATORY_CARE_PROVIDER_SITE_OTHER): Payer: Medicare HMO | Admitting: Family Medicine

## 2021-05-26 VITALS — BP 115/86 | HR 62 | Temp 97.3°F | Ht 69.0 in | Wt 211.2 lb

## 2021-05-26 DIAGNOSIS — R200 Anesthesia of skin: Secondary | ICD-10-CM | POA: Diagnosis not present

## 2021-05-26 DIAGNOSIS — M7712 Lateral epicondylitis, left elbow: Secondary | ICD-10-CM | POA: Diagnosis not present

## 2021-05-26 DIAGNOSIS — R202 Paresthesia of skin: Secondary | ICD-10-CM

## 2021-05-26 MED ORDER — HYDROCODONE-ACETAMINOPHEN 5-325 MG PO TABS: 1.0000 | ORAL_TABLET | Freq: Four times a day (QID) | ORAL | 0 refills | Status: DC | PRN

## 2021-05-26 NOTE — Progress Notes (Signed)
? ?Subjective:  ? ? Patient ID: Kathryn Zavala, female    DOB: 10/05/1955, 66 y.o.   MRN: [id number] ? ?Patient is a very pleasant 66 year old African-American female who presents today with pain in her left arm.  She has pain over the lateral epicondyle and the flexor compartment of the forearm.  She has some pain with gripping motion in the hand.  She is tender to palpation of the lateral epicondyle.  However she also reports some numbness and tingling in the hand.  She has a positive Tinel's sign.  She has a positive Phalen sign.  She has no pain at the ulnar groove.  I believe the patient may be dealing with 2 separate issues 1 tennis elbow and 1 carpal tunnel.  Her biggest issue is the pain in her forearm and the pain around the lateral epicondyle. ?Past Medical History:  ?Diagnosis Date  ? Cancer [facility])   ? 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 Cataract   ? Left leg pain   ? numbness  ? Stroke [facility])   ? Pt reports that she thinks she had a stroke in her late 92s, early 5s.  ? ? ?Past Surgical History:  ?Procedure Laterality Date  ? ABDOMINAL HYSTERECTOMY    ? due to fibroids at 35, [facility] BSO  ? COLON SURGERY    ? COLONOSCOPY    ? JOINT REPLACEMENT    ? TKR x 2 on left knee  ? LIVER SURGERY    ? ?Current Outpatient Medications on File Prior to Visit  ?Medication Sig Dispense Refill  ? acetaminophen (TYLENOL) 500 MG tablet Take 500 mg by mouth every 6 (six) hours as needed.    ? escitalopram (LEXAPRO) 20 MG tablet Take 1 tablet (20 mg total) by mouth daily. 90 tablet 3  ? HYDROcodone-acetaminophen (NORCO) 5-325 MG tablet Take 1 tablet by mouth every 6 (six) hours as needed for moderate pain. 10 tablet 0  ? ibuprofen (ADVIL) 600 MG tablet Take 1 tablet (600 mg total) by mouth every 6 (six) hours as needed. 30 tablet 0  ? sulfamethoxazole-trimethoprim (BACTRIM DS) 800-160 MG tablet Take 1 tablet by mouth 2 (two) times daily. (Patient not taking: Reported on  05/26/2021) 10 tablet 0  ? ?No current facility-administered medications on file prior to visit.  ? ?Allergies  ?Allergen Reactions  ? Duloxetine Diarrhea, Nausea And Vomiting and Other (See Comments)  ?  red blisters on face and arms  ? ?Social History  ? ?Socioeconomic History  ? Marital status: Soil scientist  ?  Spouse name: Peter Congo  ? Number of children: 5  ? Years of education: 49  ? Highest education level: High school graduate  ?Occupational History  ? Occupation: Patent attorney  ?Tobacco Use  ? Smoking status: Former  ?  Types: Cigarettes  ? Smokeless tobacco: Never  ? Tobacco comments:  ?  high school days  ?Vaping Use  ? Vaping Use: Never used  ?Substance and Sexual Activity  ? Alcohol use: Yes  ?  Comment: beer every other day  ? Drug use: No  ? Sexual activity: Not on file  ?Other Topics Concern  ? Not on file  ?Social History Narrative  ? Lives at home with her domestic partner, Peter Congo.  ? Right-handed.  ? Two cups coffee per day.  ? Works at [employer].  ? 11 grandchildren.  ? ?Social Determinants of Health  ? ?Emergency planning/management officer  Strain: Low Risk   ? Difficulty of Paying Living Expenses: Not hard at all  ?Food Insecurity: No Food Insecurity  ? Worried About Charity fundraiser in the Last Year: Never true  ? Ran Out of Food in the Last Year: Never true  ?Transportation Needs: No Transportation Needs  ? Lack of Transportation (Medical): No  ? Lack of Transportation (Non-Medical): No  ?Physical Activity: Sufficiently Active  ? Days of Exercise per Week: 7 days  ? Minutes of Exercise per Session: 30 min  ?Stress: No Stress Concern Present  ? Feeling of Stress : Not at all  ?Social Connections: Socially Integrated  ? Frequency of Communication with Friends and Family: More than three times a week  ? Frequency of Social Gatherings with Friends and Family: More than three times a week  ? Attends Religious Services: More than 4 times per year  ? Active Member of Clubs or Organizations: Yes  ?  Attends Archivist Meetings: More than 4 times per year  ? Marital Status: Married  ?Intimate Partner Violence: Not At Risk  ? Fear of Current or Ex-Partner: No  ? Emotionally Abused: No  ? Physically Abused: No  ? Sexually Abused: No  ? ?Family History  ?Problem Relation Age of Onset  ? Cancer Mother   ?     unsure of type  ? Cancer Father   ?     unsure of type  ? Colon cancer Neg Hx   ? Esophageal cancer Neg Hx   ? Rectal cancer Neg Hx   ? Stomach cancer Neg Hx   ? Breast cancer Neg Hx   ? ? ? ? ?Review of Systems  ?All other systems reviewed and are negative. ? ?   ?Objective:  ? Physical Exam ?Vitals reviewed.  ?Constitutional:   ?   General: She is not in acute distress. ?   Appearance: She is well-developed. She is not diaphoretic.  ?HENT:  ?   Head: Normocephalic and atraumatic.  ?Eyes:  ?   General: No visual field deficit. ?Neck:  ?   Thyroid: No thyromegaly.  ?   Vascular: No JVD.  ?   Trachea: No tracheal deviation.  ?Cardiovascular:  ?   Rate and Rhythm: Normal rate and regular rhythm.  ?   Heart sounds: Normal heart sounds. No murmur heard. ?  No friction rub. No gallop.  ?Pulmonary:  ?   Effort: Pulmonary effort is normal. No respiratory distress.  ?   Breath sounds: Normal breath sounds. No stridor. No wheezing or rales.  ?Chest:  ?   Chest wall: No tenderness.  ?Musculoskeletal:  ?   Left elbow: Normal range of motion. Tenderness present in lateral epicondyle.  ?Neurological:  ?   Mental Status: She is alert and oriented to person, place, and time. Mental status is at baseline.  ?   Cranial Nerves: Cranial nerves 2-12 are intact. No cranial nerve deficit, dysarthria or facial asymmetry.  ?   Motor: Motor function is intact. No weakness, tremor, atrophy, abnormal muscle tone, seizure activity or pronator drift.  ?   Coordination: Coordination is intact. Romberg sign negative. Finger-Nose-Finger Test normal.  ?   Gait: Gait is intact.  ?   Deep Tendon Reflexes: Reflexes are normal and  symmetric.  ?Psychiatric:     ?   Mood and Affect: Mood normal.     ?   Behavior: Behavior normal.     ?   Thought Content: Thought  content normal.     ?   Judgment: Judgment normal.  ? ? ? ? ? ?   ?Assessment & Plan:  ?Numbness and tingling in left hand ? ?Lateral epicondylitis of left elbow ?I see no evidence of a stroke today.  I believe there are 2 separate issues.  I believe the patient is dealing with carpal tunnel syndrome in her left hand.  We discussed referral for nerve conduction studies.  However her bigger concern is not the numbness and tingling and occasional pain in her hand with constant aching pain around the lateral epicondyle.  Based on her exam I think she has lateral epicondylitis.  Using sterile technique, I injected just distal to the lateral epicondyle mixture of 1/2 cc of 0.1% lidocaine and 1/2 cc of 40 mg per mill Kenalog.  The patient tolerated procedure well.  Await to see the patient's response to treatment to determine if she would benefit from nerve conduction studies as a second step ?

## 2021-05-26 NOTE — Telephone Encounter (Signed)
LOV 05/25/21 ?Last refill 02/24/21, #10, 0 refills ? ?Please review, thanks! ? ? ?

## 2021-06-02 ENCOUNTER — Encounter: Payer: Self-pay | Admitting: Family Medicine

## 2021-07-05 DIAGNOSIS — H5213 Myopia, bilateral: Secondary | ICD-10-CM | POA: Diagnosis not present

## 2021-07-27 ENCOUNTER — Other Ambulatory Visit: Payer: Self-pay | Admitting: Family Medicine

## 2021-09-13 ENCOUNTER — Encounter: Payer: Self-pay | Admitting: Family Medicine

## 2021-09-14 NOTE — Telephone Encounter (Signed)
Please schedule this pt to come see Dr. Dennard Schaumann. Re sleep issues.   Thank you.

## 2021-09-19 ENCOUNTER — Ambulatory Visit (INDEPENDENT_AMBULATORY_CARE_PROVIDER_SITE_OTHER): Payer: Medicare HMO | Admitting: Family Medicine

## 2021-09-19 VITALS — BP 122/80 | HR 65 | Temp 98.2°F | Ht 69.0 in | Wt 215.0 lb

## 2021-09-19 DIAGNOSIS — G4700 Insomnia, unspecified: Secondary | ICD-10-CM

## 2021-09-19 MED ORDER — HYDROXYZINE PAMOATE 25 MG PO CAPS: 25.0000 mg | ORAL_CAPSULE | Freq: Every evening | ORAL | 0 refills | Status: DC | PRN

## 2021-09-19 NOTE — Progress Notes (Signed)
Subjective:    Patient ID: Kathryn Zavala, female    DOB: 1955/10/15, 66 y.o.   MRN: [id number]   Patient is here today with her partner.  They state for the last several months she has been having trouble sleeping.  She started working third shift and getting off at 3:30 in the morning.  She has never done this before.  She gets home and then tosses and turns for several hours and does not fall asleep till later in the day.  She is not getting resistant restful restorative sleep.  She tried melatonin and other things to try to help reset her sleep schedule but nothing seems to be working.  She is also having more stress.  Her brother just passed away.  Her partners family also has a sick family member who is possibly dying.  This is adding to her anxiety and stress. 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    Past Surgical History:  Procedure Laterality Date   ABDOMINAL HYSTERECTOMY     due to fibroids at 76, [facility] BSO   COLON SURGERY     COLONOSCOPY     JOINT REPLACEMENT     TKR x 2 on left knee   LIVER SURGERY     Current Outpatient Medications on File Prior to Visit  Medication Sig Dispense Refill   acetaminophen (TYLENOL) 500 MG tablet Take 500 mg by mouth every 6 (six) hours as needed.     escitalopram (LEXAPRO) 20 MG tablet Take 1 tablet by mouth once daily 90 tablet 0   HYDROcodone-acetaminophen (NORCO) 5-325 MG tablet Take 1 tablet by mouth every 6 (six) hours as needed for moderate pain. 10 tablet 0   ibuprofen (ADVIL) 600 MG tablet Take 1 tablet (600 mg total) by mouth every 6 (six) hours as needed. 30 tablet 0   sulfamethoxazole-trimethoprim (BACTRIM DS) 800-160 MG tablet Take 1 tablet by mouth 2 (two) times daily. (Patient not taking:  Reported on 09/19/2021) 10 tablet 0   No current facility-administered medications on file prior to visit.   Allergies  Allergen Reactions   Duloxetine Diarrhea, Nausea And Vomiting and Other (See Comments)    red blisters on face and arms   Social History   Socioeconomic History   Marital status: Domestic Partner    Spouse name: Peter Congo   Number of children: 5   Years of education: 12   Highest education level: High school graduate  Occupational History   Occupation: Patent attorney  Tobacco Use   Smoking status: Former    Types: Cigarettes   Smokeless tobacco: Never   Tobacco comments:    high school days  Vaping Use   Vaping Use: Never used  Substance and Sexual Activity   Alcohol use: Yes    Comment: beer every other day   Drug use: No   Sexual activity: Not on file  Other Topics Concern   Not on file  Social History Narrative   Lives at home with her domestic partner, Peter Congo.   Right-handed.   Two cups coffee per day.   Works at [employer].   11 grandchildren.   Social Determinants of  Health   Financial Resource Strain: Low Risk  (04/22/2021)   Overall Financial Resource Strain (CARDIA)    Difficulty of Paying Living Expenses: Not hard at all  Food Insecurity: No Food Insecurity (04/22/2021)   Hunger Vital Sign    Worried About Running Out of Food in the Last Year: Never true    Ran Out of Food in the Last Year: Never true  Transportation Needs: No Transportation Needs (04/22/2021)   PRAPARE - Hydrologist (Medical): No    Lack of Transportation (Non-Medical): No  Physical Activity: Sufficiently Active (04/22/2021)   Exercise Vital Sign    Days of Exercise per Week: 7 days    Minutes of Exercise per Session: 30 min  Stress: No Stress Concern Present (04/22/2021)   [place]    Feeling of Stress : Not at all  Social Connections: [place]  (04/22/2021)   Social Connection and Isolation Panel [NHANES]    Frequency of Communication with Friends and Family: More than three times a week    Frequency of Social Gatherings with Friends and Family: More than three times a week    Attends Religious Services: More than 4 times per year    Active Member of [employer] or Organizations: Yes    Attends Music therapist: More than 4 times per year    Marital Status: Married  Human resources officer Violence: Not At Risk (04/22/2021)   Humiliation, Afraid, Rape, and Kick questionnaire    Fear of Current or Ex-Partner: No    Emotionally Abused: No    Physically Abused: No    Sexually Abused: No   Family History  Problem Relation Age of Onset   Cancer Mother        unsure of type   Cancer Father        unsure of type   Colon cancer Neg Hx    Esophageal cancer Neg Hx    Rectal cancer Neg Hx    Stomach cancer Neg Hx    Breast cancer Neg Hx       Review of Systems  All other systems reviewed and are negative.      Objective:   Physical Exam Vitals reviewed.  Constitutional:      General: She is not in acute distress.    Appearance: She is well-developed. She is not diaphoretic.  HENT:     Head: Normocephalic and atraumatic.  Neck:     Thyroid: No thyromegaly.     Vascular: No JVD.     Trachea: No tracheal deviation.  Cardiovascular:     Rate and Rhythm: Normal rate and regular rhythm.     Heart sounds: Normal heart sounds. No murmur heard.    No friction rub. No gallop.  Pulmonary:     Effort: Pulmonary effort is normal. No respiratory distress.     Breath sounds: Normal breath sounds. No stridor. No wheezing or rales.  Chest:     Chest wall: No tenderness.  Neurological:     Mental Status: She is alert.     Motor: No abnormal muscle tone.  Psychiatric:        Mood and Affect: Mood normal.        Behavior: Behavior normal.        Thought Content: Thought content normal.        Judgment: Judgment normal.            Assessment &  Plan:  Insomnia, unspecified type Patient would like to try nonaddictive versions first.  We discussed options and she would like to try hydroxyzine 25 mg p.o. nightly as needed insomnia.  If this does not work we can potentially try something like Ambien however she wants to avoid addictive medications if at all possible.

## 2021-09-20 DIAGNOSIS — H25042 Posterior subcapsular polar age-related cataract, left eye: Secondary | ICD-10-CM | POA: Diagnosis not present

## 2021-09-20 DIAGNOSIS — Z961 Presence of intraocular lens: Secondary | ICD-10-CM | POA: Diagnosis not present

## 2021-09-20 DIAGNOSIS — H2512 Age-related nuclear cataract, left eye: Secondary | ICD-10-CM | POA: Diagnosis not present

## 2021-09-20 DIAGNOSIS — H18413 Arcus senilis, bilateral: Secondary | ICD-10-CM | POA: Diagnosis not present

## 2021-10-08 ENCOUNTER — Ambulatory Visit
Admission: EM | Admit: 2021-10-08 | Discharge: 2021-10-08 | Disposition: A | Discharge status: Home / Self Care | Payer: Medicare HMO | Attending: Urgent Care | Admitting: Urgent Care

## 2021-10-08 ENCOUNTER — Encounter: Payer: Self-pay | Admitting: Emergency Medicine

## 2021-10-08 DIAGNOSIS — R0602 Shortness of breath: Secondary | ICD-10-CM | POA: Insufficient documentation

## 2021-10-08 DIAGNOSIS — J069 Acute upper respiratory infection, unspecified: Secondary | ICD-10-CM | POA: Insufficient documentation

## 2021-10-08 DIAGNOSIS — B349 Viral infection, unspecified: Secondary | ICD-10-CM | POA: Diagnosis not present

## 2021-10-08 DIAGNOSIS — R062 Wheezing: Secondary | ICD-10-CM | POA: Diagnosis not present

## 2021-10-08 DIAGNOSIS — R052 Subacute cough: Secondary | ICD-10-CM | POA: Insufficient documentation

## 2021-10-08 DIAGNOSIS — R5383 Other fatigue: Secondary | ICD-10-CM | POA: Diagnosis not present

## 2021-10-08 DIAGNOSIS — Z20822 Contact with and (suspected) exposure to covid-19: Secondary | ICD-10-CM | POA: Diagnosis not present

## 2021-10-08 DIAGNOSIS — R5381 Other malaise: Secondary | ICD-10-CM | POA: Insufficient documentation

## 2021-10-08 MED ORDER — PROMETHAZINE-DM 6.25-15 MG/5ML PO SYRP: 2.5000 mL | ORAL_SOLUTION | Freq: Three times a day (TID) | ORAL | 0 refills | Status: DC | PRN

## 2021-10-08 MED ORDER — ACETAMINOPHEN 325 MG PO TABS: 650.0000 mg | ORAL_TABLET | Freq: Four times a day (QID) | ORAL | 0 refills | Status: DC | PRN

## 2021-10-08 MED ORDER — IPRATROPIUM BROMIDE 0.03 % NA SOLN: 2.0000 | Freq: Two times a day (BID) | NASAL | 0 refills | Status: DC

## 2021-10-08 MED ORDER — CETIRIZINE HCL 10 MG PO TABS: 10.0000 mg | ORAL_TABLET | Freq: Every day | ORAL | 0 refills | Status: DC

## 2021-10-08 MED ORDER — BENZONATATE 100 MG PO CAPS: 100.0000 mg | ORAL_CAPSULE | Freq: Three times a day (TID) | ORAL | 0 refills | Status: DC | PRN

## 2021-10-08 NOTE — ED Triage Notes (Signed)
Pt here with cough, congestion, dizziness and weakness x 3 days.

## 2021-10-08 NOTE — ED Provider Notes (Signed)
[facility]  Note:  This document was prepared using Dragon voice recognition software and may include unintentional dictation errors.  MRN: [id number] DOB: 10/04/1955  Subjective:   Kathryn Zavala is a 66 y.o. female presenting for 3-day history of acute onset sinus congestion, coughing, shortness of breath malaise and fatigue, dizziness and weakness.  Patient is agreeable to a COVID test.  No chest pain, smoking history, history of respiratory disorders.  Patient is agreeable to have metabolic panel to confirm her kidney function.  No current facility-administered medications for this encounter.  Current Outpatient Medications:    acetaminophen (TYLENOL) 325 MG tablet, Take 2 tablets (650 mg total) by mouth every 6 (six) hours as needed for moderate pain., Disp: 30 tablet, Rfl: 0   benzonatate (TESSALON) 100 MG capsule, Take 1-2 capsules (100-200 mg total) by mouth 3 (three) times daily as needed for cough., Disp: 60 capsule, Rfl: 0   cetirizine (ZYRTEC ALLERGY) 10 MG tablet, Take 1 tablet (10 mg total) by mouth daily., Disp: 30 tablet, Rfl: 0   ipratropium (ATROVENT) 0.03 % nasal spray, Place 2 sprays into both nostrils 2 (two) times daily., Disp: 30 mL, Rfl: 0   promethazine-dextromethorphan (PROMETHAZINE-DM) 6.25-15 MG/5ML syrup, Take 2.5 mLs by mouth 3 (three) times daily as needed for cough., Disp: 100 mL, Rfl: 0   escitalopram (LEXAPRO) 20 MG tablet, Take 1 tablet by mouth once daily, Disp: 90 tablet, Rfl: 0   HYDROcodone-acetaminophen (NORCO) 5-325 MG tablet, Take 1 tablet by mouth every 6 (six) hours as needed for moderate pain., Disp: 10 tablet, Rfl: 0   hydrOXYzine (VISTARIL) 25 MG capsule, Take 1 capsule (25 mg total) by mouth at bedtime as needed (insomnia)., Disp: 30 capsule, Rfl: 0   ibuprofen (ADVIL) 600 MG tablet, Take 1 tablet (600 mg total) by mouth every 6 (six) hours as needed., Disp: 30 tablet, Rfl: 0   sulfamethoxazole-trimethoprim (BACTRIM  DS) 800-160 MG tablet, Take 1 tablet by mouth 2 (two) times daily. (Patient not taking: Reported on 09/19/2021), Disp: 10 tablet, Rfl: 0   Allergies  Allergen Reactions   Duloxetine Diarrhea, Nausea And Vomiting and Other (See Comments)    red blisters on face and arms    Past Medical History:  Diagnosis Date   Cancer (HCC)    2010 Colon with mets to the liver, partial colectomy, chemo, and liver met resection   Cataract    Left leg pain    numbness   Stroke (HCC)    Pt reports that she thinks she had a stroke in her late 20s, early 30s.     Past Surgical History:  Procedure Laterality Date   ABDOMINAL HYSTERECTOMY     due to fibroids at 35, TAH BSO   COLON SURGERY     COLONOSCOPY     JOINT REPLACEMENT     TKR x 2 on left knee   LIVER SURGERY      Family History  Problem Relation Age of Onset   Cancer Mother        unsure of type   Cancer Father        unsure of type   Colon cancer Neg Hx    Esophageal cancer Neg Hx    Rectal cancer Neg Hx    Stomach cancer Neg Hx    Breast cancer Neg Hx     Social History   Tobacco Use   Smoking status: Former    Types: Cigarettes   Smokeless tobacco:   Never   Tobacco comments:    high school days  Vaping Use   Vaping Use: Never used  Substance Use Topics   Alcohol use: Yes    Comment: beer every other day   Drug use: No    ROS   Objective:   Vitals: BP 126/86   Pulse 63   Temp 98.7 F (37.1 C)   Resp 18   SpO2 97%   Physical Exam Constitutional:      General: She is not in acute distress.    Appearance: Normal appearance. She is well-developed and normal weight. She is not ill-appearing, toxic-appearing or diaphoretic.  HENT:     Head: Normocephalic and atraumatic.     Right Ear: Tympanic membrane, ear canal and external ear normal. No drainage or tenderness. No middle ear effusion. There is no impacted cerumen. Tympanic membrane is not erythematous.     Left Ear: Tympanic membrane, ear canal and external  ear normal. No drainage or tenderness.  No middle ear effusion. There is no impacted cerumen. Tympanic membrane is not erythematous.     Nose: Congestion present. No rhinorrhea.     Mouth/Throat:     Mouth: Mucous membranes are moist. No oral lesions.     Pharynx: No pharyngeal swelling, oropharyngeal exudate, posterior oropharyngeal erythema or uvula swelling.     Tonsils: No tonsillar exudate or tonsillar abscesses.  Eyes:     General: No scleral icterus.       Right eye: No discharge.        Left eye: No discharge.     Extraocular Movements: Extraocular movements intact.     Right eye: Normal extraocular motion.     Left eye: Normal extraocular motion.     Conjunctiva/sclera: Conjunctivae normal.  Cardiovascular:     Rate and Rhythm: Normal rate and regular rhythm.     Heart sounds: Normal heart sounds. No murmur heard.    No friction rub. No gallop.  Pulmonary:     Effort: Pulmonary effort is normal. No respiratory distress.     Breath sounds: No stridor. No wheezing, rhonchi or rales.  Chest:     Chest wall: No tenderness.  Musculoskeletal:     Cervical back: Normal range of motion and neck supple.  Lymphadenopathy:     Cervical: No cervical adenopathy.  Skin:    General: Skin is warm and dry.  Neurological:     General: No focal deficit present.     Mental Status: She is alert and oriented to person, place, and time.     Cranial Nerves: No cranial nerve deficit.     Motor: No weakness.     Coordination: Coordination normal.     Gait: Gait normal.     Deep Tendon Reflexes: Reflexes normal.  Psychiatric:        Mood and Affect: Mood normal.        Behavior: Behavior normal.        Thought Content: Thought content normal.        Judgment: Judgment normal.     Assessment and Plan :   PDMP not reviewed this encounter.  1. Acute viral syndrome   2. Subacute cough   3. Shortness of breath   4. Wheezing   5. Malaise and fatigue    Deferred imaging given clear  cardiopulmonary exam, hemodynamically stable vital signs. Will manage for viral illness such as viral URI, viral syndrome, viral rhinitis, COVID-19. Recommended supportive care. Offered scripts for symptomatic relief.  Testing is pending. Counseled patient on potential for adverse effects with medications prescribed/recommended today, ER and return-to-clinic precautions discussed, patient verbalized understanding.   Patient would be a great candidate for Paxlovid should her kidney function allow for this.   Mani, Mario, PA-C 10/08/21 1428  

## 2021-10-08 NOTE — Discharge Instructions (Addendum)
We will notify you of your test results as they arrive and may take between about 24 hours.  I encourage you to sign up for MyChart if you have not already done so as this can be the easiest way for us to communicate results to you online or through a phone app.  Generally, we only contact you if it is a positive test result.  In the meantime, if you develop worsening symptoms including fever, chest pain, shortness of breath despite our current treatment plan then please report to the emergency room as this may be a sign of worsening status from possible viral infection.  Otherwise, we will manage this as a viral syndrome. For sore throat or cough try using a honey-based tea. Use 3 teaspoons of honey with juice squeezed from half lemon. Place shaved pieces of ginger into 1/2-1 cup of water and warm over stove top. Then mix the ingredients and repeat every 4 hours as needed. Please take Tylenol 500mg-650mg every 6 hours for aches and pains, fevers. Hydrate very well with at least 2 liters of water. Eat light meals such as soups to replenish electrolytes and soft fruits, veggies. Start an antihistamine like Zyrtec for postnasal drainage, sinus congestion.  You can take this together with the nose spray.  Use the cough medications as needed.   

## 2021-10-09 LAB — BASIC METABOLIC PANEL
BUN/Creatinine Ratio: 12 (ref 12–28)
BUN: 11 mg/dL (ref 8–27)
CO2: 21 mmol/L (ref 20–29)
Calcium: 8.9 mg/dL (ref 8.7–10.3)
Chloride: 108 mmol/L — ABNORMAL HIGH (ref 96–106)
Creatinine, Ser: 0.91 mg/dL (ref 0.57–1.00)
Glucose: 94 mg/dL (ref 70–99)
Potassium: 4 mmol/L (ref 3.5–5.2)
Sodium: 145 mmol/L — ABNORMAL HIGH (ref 134–144)
eGFR: 69 mL/min/{1.73_m2} (ref >59)

## 2021-10-09 LAB — SARS CORONAVIRUS 2 (TAT 6-24 HRS): SARS Coronavirus 2: NEGATIVE

## 2021-10-19 ENCOUNTER — Encounter: Payer: Self-pay | Admitting: Family Medicine

## 2021-10-21 ENCOUNTER — Other Ambulatory Visit: Payer: Self-pay | Admitting: Family Medicine

## 2021-10-21 DIAGNOSIS — H2512 Age-related nuclear cataract, left eye: Secondary | ICD-10-CM | POA: Diagnosis not present

## 2021-10-21 DIAGNOSIS — Z961 Presence of intraocular lens: Secondary | ICD-10-CM | POA: Diagnosis not present

## 2021-10-21 DIAGNOSIS — H2513 Age-related nuclear cataract, bilateral: Secondary | ICD-10-CM | POA: Diagnosis not present

## 2021-11-14 ENCOUNTER — Other Ambulatory Visit: Payer: Self-pay | Admitting: Family Medicine

## 2021-11-15 MED ORDER — ESCITALOPRAM OXALATE 20 MG PO TABS: 20.0000 mg | ORAL_TABLET | Freq: Every day | ORAL | 0 refills | Status: DC

## 2021-11-16 ENCOUNTER — Other Ambulatory Visit: Payer: Self-pay

## 2021-11-16 MED ORDER — ESCITALOPRAM OXALATE 20 MG PO TABS: 20.0000 mg | ORAL_TABLET | Freq: Every day | ORAL | 0 refills | Status: DC

## 2021-11-24 ENCOUNTER — Other Ambulatory Visit: Payer: Self-pay

## 2021-11-24 MED ORDER — ESCITALOPRAM OXALATE 20 MG PO TABS: 20.0000 mg | ORAL_TABLET | Freq: Every day | ORAL | 0 refills | Status: DC

## 2022-04-26 ENCOUNTER — Other Ambulatory Visit: Payer: Self-pay | Admitting: Family Medicine

## 2022-04-26 DIAGNOSIS — Z1231 Encounter for screening mammogram for malignant neoplasm of breast: Secondary | ICD-10-CM

## 2022-04-27 ENCOUNTER — Encounter: Payer: Self-pay | Admitting: Family Medicine

## 2022-04-27 ENCOUNTER — Ambulatory Visit (INDEPENDENT_AMBULATORY_CARE_PROVIDER_SITE_OTHER): Payer: Medicare Other | Admitting: Family Medicine

## 2022-04-27 DIAGNOSIS — Z0000 Encounter for general adult medical examination without abnormal findings: Secondary | ICD-10-CM | POA: Diagnosis not present

## 2022-04-27 DIAGNOSIS — Z780 Asymptomatic menopausal state: Secondary | ICD-10-CM

## 2022-04-27 DIAGNOSIS — Z1211 Encounter for screening for malignant neoplasm of colon: Secondary | ICD-10-CM

## 2022-04-27 NOTE — Progress Notes (Signed)
Subjective:   Kathryn Zavala is a 67 y.o. female who presents for Medicare Annual (Subsequent) preventive examination.  Review of Systems     Cardiac Risk Factors include: none     Objective:    There were no vitals filed for this visit. There is no height or weight on file to calculate BMI.     04/22/2021    9:09 AM 06/02/2019    9:36 PM 10/05/2018    7:40 AM 05/19/2016    1:12 PM  Advanced Directives  Does Patient Have a Medical Advance Directive? No No No No  Would patient like information on creating a medical advance directive? No - Patient declined No - Patient declined  No - Patient declined    Current Medications (verified) Outpatient Encounter Medications as of 04/27/2022  Medication Sig   acetaminophen (TYLENOL) 325 MG tablet Take 2 tablets (650 mg total) by mouth every 6 (six) hours as needed for moderate pain. (Patient not taking: Reported on 04/27/2022)   benzonatate (TESSALON) 100 MG capsule Take 1-2 capsules (100-200 mg total) by mouth 3 (three) times daily as needed for cough. (Patient not taking: Reported on 04/27/2022)   cetirizine (ZYRTEC ALLERGY) 10 MG tablet Take 1 tablet (10 mg total) by mouth daily. (Patient not taking: Reported on 04/27/2022)   escitalopram (LEXAPRO) 20 MG tablet Take 1 tablet (20 mg total) by mouth daily. (Patient not taking: Reported on 04/27/2022)   HYDROcodone-acetaminophen (NORCO) 5-325 MG tablet Take 1 tablet by mouth every 6 (six) hours as needed for moderate pain. (Patient not taking: Reported on 04/27/2022)   hydrOXYzine (VISTARIL) 25 MG capsule Take 1 capsule (25 mg total) by mouth at bedtime as needed (insomnia). (Patient not taking: Reported on 04/27/2022)   ibuprofen (ADVIL) 600 MG tablet Take 1 tablet (600 mg total) by mouth every 6 (six) hours as needed. (Patient not taking: Reported on 04/27/2022)   ipratropium (ATROVENT) 0.03 % nasal spray Place 2 sprays into both nostrils 2 (two) times daily. (Patient not taking: Reported on  04/27/2022)   promethazine-dextromethorphan (PROMETHAZINE-DM) 6.25-15 MG/5ML syrup Take 2.5 mLs by mouth 3 (three) times daily as needed for cough. (Patient not taking: Reported on 04/27/2022)   sulfamethoxazole-trimethoprim (BACTRIM DS) 800-160 MG tablet Take 1 tablet by mouth 2 (two) times daily. (Patient not taking: Reported on 09/19/2021)   No facility-administered encounter medications on file as of 04/27/2022.    Allergies (verified) Duloxetine   History: Past Medical History:  Diagnosis Date   Cancer ([place])    2010 Colon with mets to the liver, partial colectomy, chemo, and liver met resection   Cataract    Left leg pain    numbness   Stroke ([place])    Pt reports that she thinks she had a stroke in her late 39s, early 36s.   Past Surgical History:  Procedure Laterality Date   ABDOMINAL HYSTERECTOMY     due to fibroids at 15, TAH BSO   COLON SURGERY     COLONOSCOPY     JOINT REPLACEMENT     TKR x 2 on left knee   LIVER SURGERY     Family History  Problem Relation Age of Onset   Cancer Mother        unsure of type   Cancer Father        unsure of type   Colon cancer Neg Hx    Esophageal cancer Neg Hx    Rectal cancer Neg Hx    Stomach cancer Neg  Hx    Breast cancer Neg Hx    Social History   Socioeconomic History   Marital status: Soil scientist    Spouse name: Peter Congo   Number of children: 5   Years of education: 12   Highest education level: High school graduate  Occupational History   Occupation: Patent attorney  Tobacco Use   Smoking status: Former    Types: Cigarettes   Smokeless tobacco: Never   Tobacco comments:    high school days  Vaping Use   Vaping Use: Never used  Substance and Sexual Activity   Alcohol use: Yes    Comment: beer every other day   Drug use: No   Sexual activity: Not on file  Other Topics Concern   Not on file  Social History Narrative   Lives at home with her domestic partner, Peter Congo.   Right-handed.   Two cups  coffee per day.   Works at [employer].   11 grandchildren.   Social Determinants of Health   Financial Resource Strain: Low Risk  (04/27/2022)   Overall Financial Resource Strain (CARDIA)    Difficulty of Paying Living Expenses: Not hard at all  Food Insecurity: No Food Insecurity (04/27/2022)   Hunger Vital Sign    Worried About Running Out of Food in the Last Year: Never true    Ran Out of Food in the Last Year: Never true  Transportation Needs: No Transportation Needs (04/27/2022)   PRAPARE - Hydrologist (Medical): No    Lack of Transportation (Non-Medical): No  Physical Activity: Sufficiently Active (04/27/2022)   Exercise Vital Sign    Days of Exercise per Week: 3 days    Minutes of Exercise per Session: 90 min  Stress: No Stress Concern Present (04/27/2022)   [place]    Feeling of Stress : Not at all  Social Connections: Moderately Integrated (04/27/2022)   Social Connection and Isolation Panel [NHANES]    Frequency of Communication with Friends and Family: More than three times a week    Frequency of Social Gatherings with Friends and Family: More than three times a week    Attends Religious Services: More than 4 times per year    Active Member of [employer] or Organizations: Yes    Attends Music therapist: More than 4 times per year    Marital Status: Separated    Tobacco Counseling Counseling given: Not Answered Tobacco comments: high school days   Clinical Intake:  Pre-visit preparation completed: Yes  Pain : No/denies pain     Nutritional Risks: None Diabetes: No  How often do you need to have someone help you when you read instructions, pamphlets, or other written materials from your doctor or pharmacy?: (P) 1 - Never  Diabetic?no  Interpreter Needed?: No      Activities of Daily Living    04/27/2022   11:39 AM 04/21/2022    5:04 PM   In your present state of health, do you have any difficulty performing the following activities:  Hearing? 0 0  Vision? 0 1  Difficulty concentrating or making decisions? 0 0  Walking or climbing stairs? 0 0  Dressing or bathing? 0 0  Doing errands, shopping? 0 0  Preparing Food and eating ? N N  Using the Toilet? N N  In the past six months, have you accidently leaked urine? N Y  Do you have problems with  loss of bowel control? N N  Managing your Medications? N N  Managing your Finances? N N  Housekeeping or managing your Housekeeping? N N    Patient Care Team: Susy Frizzle, MD as PCP - General (Family Medicine) Jonnie Finner, RN (Inactive) as Oncology Nurse Navigator Jonnie Finner, RN (Inactive) as Oncology Nurse Navigator Truitt Merle, MD as Consulting Physician (Oncology) Thornton Park, MD as Consulting Physician (Gastroenterology)  Indicate any recent Medical Services you may have received from other than [facility] providers in the past year (date may be approximate).     Assessment:   This is a routine wellness examination for Dima.  Hearing/Vision screen No results found.  Dietary issues and exercise activities discussed:     Goals Addressed               This Visit's Progress     Exercise 3x per week (30 min per time) (pt-stated)   On track      Depression Screen    04/27/2022   11:37 AM 04/22/2021    9:06 AM 12/10/2020    9:18 AM 02/27/2020    2:28 PM 09/24/2017    2:50 PM 01/02/2015    8:37 AM  PHQ 2/9 Scores  PHQ - 2 Score 0 0 0 4 0 0  PHQ- 9 Score   4 13      Fall Risk    04/27/2022   11:39 AM 04/21/2022    5:04 PM 04/22/2021    9:10 AM 12/10/2020    9:17 AM 02/27/2020    2:28 PM  Fall Risk   Falls in the past year? 0 0 0 0 0  Number falls in past yr: 0 0 0 0   Injury with Fall? 0 0 0 0   Risk for fall due to :   No Fall Risks No Fall Risks No Fall Risks  Follow up   Falls prevention discussed Falls evaluation completed Falls  evaluation completed    Zeb:  Any stairs in or around the home? Yes  If so, are there any without handrails? No  Home free of loose throw rugs in walkways, pet beds, electrical cords, etc? Yes  Adequate lighting in your home to reduce risk of falls? Yes   ASSISTIVE DEVICES UTILIZED TO PREVENT FALLS:  Life alert? No  Use of a cane, walker or w/c? No  Grab bars in the bathroom? Yes  Shower chair or bench in shower? No  Elevated toilet seat or a handicapped toilet? No   TIMED UP AND GO:  Was the test performed? No .  Length of time to ambulate 10 feet: 0 sec.   Gait steady and fast without use of assistive device  Cognitive Function:        04/27/2022   11:39 AM 04/22/2021    9:12 AM  6CIT Screen  What Year? 0 points 0 points  What month? 0 points 0 points  What time? 0 points 0 points  Count back from 20 0 points 0 points  Months in reverse 0 points 0 points  Repeat phrase 0 points 0 points  Total Score 0 points 0 points    Immunizations Immunization History  Administered Date(s) Administered   Fluad Quad(high Dose 65+) 11/19/2018   Influenza,inj,Quad PF,6+ Mos 10/30/2017   PFIZER(Purple Top)SARS-COV-2 Vaccination 04/19/2019, 05/10/2019, 11/13/2019    TDAP status: Due, Education has been provided regarding the importance of this vaccine. Advised may receive  this vaccine at local pharmacy or Health Dept. Aware to provide a copy of the vaccination record if obtained from local pharmacy or Health Dept. Verbalized acceptance and understanding.  Flu Vaccine status: Up to date  Pneumococcal vaccine status: Due, Education has been provided regarding the importance of this vaccine. Advised may receive this vaccine at local pharmacy or Health Dept. Aware to provide a copy of the vaccination record if obtained from local pharmacy or Health Dept. Verbalized acceptance and understanding.  Covid-19 vaccine status: Completed  vaccines  Qualifies for Shingles Vaccine? Yes   Zostavax completed No   Shingrix Completed?: No.    Education has been provided regarding the importance of this vaccine. Patient has been advised to call insurance company to determine out of pocket expense if they have not yet received this vaccine. Advised may also receive vaccine at local pharmacy or Health Dept. Verbalized acceptance and understanding.  Screening Tests Health Maintenance  Topic Date Due   DTaP/Tdap/Td (1 - Tdap) Never done   Zoster Vaccines- Shingrix (1 of 2) Never done   Pneumonia Vaccine 15+ Years old (1 of 1 - PCV) Never done   INFLUENZA VACCINE  09/06/2021   COVID-19 Vaccine (4 - 2023-24 season) 10/07/2021   DEXA SCAN  04/28/2022 (Originally 08/23/2019)   COLONOSCOPY (Pts 45-75yrs Insurance coverage will need to be confirmed)  04/28/2022 (Originally 10/01/2020)   Medicare Annual Wellness ([place])  04/27/2023   MAMMOGRAM  04/30/2023   Hepatitis C Screening  Completed   HPV VACCINES  Aged Out    Health Maintenance  Health Maintenance Due  Topic Date Due   DTaP/Tdap/Td (1 - Tdap) Never done   Zoster Vaccines- Shingrix (1 of 2) Never done   Pneumonia Vaccine 72+ Years old (1 of 1 - PCV) Never done   INFLUENZA VACCINE  09/06/2021   COVID-19 Vaccine (4 - 2023-24 season) 10/07/2021    Colorectal cancer screening: Type of screening: Colonoscopy. Completed 2021. Repeat every 1 years unable to keep contrast down, would prefer cologuard  Mammogram status: Completed 3/242023. Repeat every year scheduled  Bone Density status: Ordered DEXA. Pt provided with contact info and advised to call to schedule appt.  Lung Cancer Screening: (Low Dose CT Chest recommended if Age 30-80 years, 30 pack-year currently smoking OR have quit w/in 15years.) does not qualify.   Lung Cancer Screening Referral: no  Additional Screening:  Hepatitis C Screening: does not qualify; Completed 2019  Vision Screening: Recommended annual  ophthalmology exams for early detection of glaucoma and other disorders of the eye. Is the patient up to date with their annual eye exam?  Yes  Who is the provider or what is the name of the office in which the patient attends annual eye exams? Dr Truman Hayward If pt is not established with a provider, would they like to be referred to a provider to establish care? No .   Dental Screening: Recommended annual dental exams for proper oral hygiene  Community Resource Referral / Chronic Care Management: CRR required this visit?  No   CCM required this visit?  No      Plan:     I have personally reviewed and noted the following in the patient's chart:   Medical and social history Use of alcohol, tobacco or illicit drugs  Current medications and supplements including opioid prescriptions. Patient is not currently taking opioid prescriptions. Functional ability and status Nutritional status Physical activity Advanced directives List of other physicians Hospitalizations, surgeries, and ER visits in previous  12 months Vitals Screenings to include cognitive, depression, and falls Referrals and appointments  In addition, I have reviewed and discussed with patient certain preventive protocols, quality metrics, and best practice recommendations. A written personalized care plan for preventive services as well as general preventive health recommendations were provided to patient.     Rubie Maid,    04/27/2022   Nurse Notes: will schedule appointment for vaccines, ordered DEXA and cologuard (she is unable to complete bowel prep)

## 2022-05-03 ENCOUNTER — Ambulatory Visit (INDEPENDENT_AMBULATORY_CARE_PROVIDER_SITE_OTHER): Payer: Medicare Other

## 2022-05-03 VITALS — Ht 69.0 in | Wt 215.0 lb

## 2022-05-03 DIAGNOSIS — Z23 Encounter for immunization: Secondary | ICD-10-CM

## 2022-05-10 ENCOUNTER — Ambulatory Visit
Admission: RE | Admit: 2022-05-10 | Discharge: 2022-05-10 | Disposition: A | Discharge status: Home / Self Care | Payer: Medicare Other | Source: Ambulatory Visit | Attending: Family Medicine | Admitting: Family Medicine

## 2022-05-10 DIAGNOSIS — Z1231 Encounter for screening mammogram for malignant neoplasm of breast: Secondary | ICD-10-CM

## 2022-05-18 ENCOUNTER — Encounter: Payer: Self-pay | Admitting: Family Medicine

## 2022-05-18 ENCOUNTER — Ambulatory Visit (INDEPENDENT_AMBULATORY_CARE_PROVIDER_SITE_OTHER): Payer: Medicare Other | Admitting: Family Medicine

## 2022-05-18 VITALS — BP 122/82 | HR 74 | Temp 98.6°F | Ht 69.0 in | Wt 197.0 lb

## 2022-05-18 DIAGNOSIS — J019 Acute sinusitis, unspecified: Secondary | ICD-10-CM

## 2022-05-18 DIAGNOSIS — M722 Plantar fascial fibromatosis: Secondary | ICD-10-CM | POA: Diagnosis not present

## 2022-05-18 MED ORDER — PREDNISONE 20 MG PO TABS: ORAL_TABLET | ORAL | 0 refills | Status: DC

## 2022-05-18 MED ORDER — ESCITALOPRAM OXALATE 20 MG PO TABS: 20.0000 mg | ORAL_TABLET | Freq: Every day | ORAL | 3 refills | Status: AC

## 2022-05-18 NOTE — Progress Notes (Signed)
Subjective:    Patient ID: Kathryn Zavala, female    DOB: 1955/02/12, 67 y.o.   MRN: [id number]  09/2021 Patient is here today with her partner.  They state for the last several months she has been having trouble sleeping.  She started working third shift and getting off at 3:30 in the morning.  She has never done this before.  She gets home and then tosses and turns for several hours and does not fall asleep till later in the day.  She is not getting resistant restful restorative sleep.  She tried melatonin and other things to try to help reset her sleep schedule but nothing seems to be working.  She is also having more stress.  Her brother just passed away.  Her partners family also has a sick family member who is possibly dying.  This is adding to her anxiety and stress.  At that time, my plan was:  Patient would like to try nonaddictive versions first.  We discussed options and she would like to try hydroxyzine 25 mg p.o. nightly as needed insomnia.  If this does not work we can potentially try something like Ambien however she wants to avoid addictive medications if at all possible.  05/18/22 Patient presents today complaining of runny nose, sore throat for several days.  She is audibly congested.  She denies any fevers or chills.  She does report some shortness of breath and tightness in her chest.  There is no wheezing on exam but she does have diminished breath sounds bilaterally.  She also complains of pain in her left foot.  The pain is located over the insertion of the plantar fascia on the plantar aspect of the calcaneus.  She is very tender to palpation in that area.  She states that it hurts severely when she gets out of bed in the morning.  It also hurts with prolonged standing and walking.  This flares up from time to time usually 1 to 2 days/month.  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  She is requesting FMLA so that she can miss days occasionally  due to pain in her foot or due to anxiety.  She has occasional anxiety related to a break-up in her relationship. Past Medical History:  Diagnosis Date  . Cancer    2010 Colon with mets to the liver, partial colectomy, chemo, and liver met resection  . Cataract   . Left leg pain    numbness  . Stroke    Pt reports that she thinks she had a stroke in her late 76s, early 22s.    Past Surgical History:  Procedure Laterality Date  . ABDOMINAL HYSTERECTOMY     due to fibroids at 35, [facility] BSO  . COLON SURGERY    . COLONOSCOPY    . JOINT REPLACEMENT     TKR x 2 on left knee  . LIVER SURGERY     Current Outpatient Medications on File Prior to Visit  Medication Sig Dispense Refill  . acetaminophen (TYLENOL) 325 MG tablet Take 2 tablets (650 mg total) by mouth every 6 (six) hours as needed for moderate pain. (Patient not taking: Reported on 04/27/2022) 30 tablet 0  . benzonatate (TESSALON) 100 MG capsule Take 1-2 capsules (100-200 mg total) by mouth 3 (three) times daily as needed for cough. (Patient not taking: Reported on 04/27/2022) 60 capsule 0  . cetirizine (ZYRTEC ALLERGY) 10 MG tablet Take 1 tablet (10 mg  total) by mouth daily. (Patient not taking: Reported on 04/27/2022) 30 tablet 0  . escitalopram (LEXAPRO) 20 MG tablet Take 1 tablet (20 mg total) by mouth daily. (Patient not taking: Reported on 04/27/2022) 90 tablet 0  . HYDROcodone-acetaminophen (NORCO) 5-325 MG tablet Take 1 tablet by mouth every 6 (six) hours as needed for moderate pain. (Patient not taking: Reported on 04/27/2022) 10 tablet 0  . hydrOXYzine (VISTARIL) 25 MG capsule Take 1 capsule (25 mg total) by mouth at bedtime as needed (insomnia). (Patient not taking: Reported on 04/27/2022) 30 capsule 0  . ibuprofen (ADVIL) 600 MG tablet Take 1 tablet (600 mg total) by mouth every 6 (six) hours as needed. (Patient not taking: Reported on 04/27/2022) 30 tablet 0  . ipratropium (ATROVENT) 0.03 % nasal spray Place 2 sprays into both  nostrils 2 (two) times daily. (Patient not taking: Reported on 04/27/2022) 30 mL 0  . promethazine-dextromethorphan (PROMETHAZINE-DM) 6.25-15 MG/5ML syrup Take 2.5 mLs by mouth 3 (three) times daily as needed for cough. (Patient not taking: Reported on 04/27/2022) 100 mL 0  . sulfamethoxazole-trimethoprim (BACTRIM DS) 800-160 MG tablet Take 1 tablet by mouth 2 (two) times daily. (Patient not taking: Reported on 09/19/2021) 10 tablet 0   No current facility-administered medications on file prior to visit.   Allergies  Allergen Reactions  . Duloxetine Diarrhea, Nausea And Vomiting and Other (See Comments)    red blisters on face and arms   Social History   Socioeconomic History  . Marital status: Media planner    Spouse name: Malachi Bonds  . Number of children: 5  . Years of education: 52  . Highest education level: High school graduate  Occupational History  . Occupation: Production designer, theatre/television/film  Tobacco Use  . Smoking status: Former    Types: Cigarettes  . Smokeless tobacco: Never  . Tobacco comments:    high school days  Vaping Use  . Vaping Use: Never used  Substance and Sexual Activity  . Alcohol use: Yes    Comment: beer every other day  . Drug use: No  . Sexual activity: Not on file  Other Topics Concern  . Not on file  Social History Narrative   Lives at home with her domestic partner, Malachi Bonds.   Right-handed.   Two cups coffee per day.   Works at [employer].   11 grandchildren.   Social Determinants of Health   Financial Resource Strain: Low Risk  (04/27/2022)   Overall Financial Resource Strain (CARDIA)   . Difficulty of Paying Living Expenses: Not hard at all  Food Insecurity: No Food Insecurity (04/27/2022)   Hunger Vital Sign   . Worried About Programme researcher, broadcasting/film/video in the Last Year: Never true   . Ran Out of Food in the Last Year: Never true  Transportation Needs: No Transportation Needs (04/27/2022)   PRAPARE - Transportation   . Lack of Transportation  (Medical): No   . Lack of Transportation (Non-Medical): No  Physical Activity: Sufficiently Active (04/27/2022)   Exercise Vital Sign   . Days of Exercise per Week: 3 days   . Minutes of Exercise per Session: 90 min  Stress: No Stress Concern Present (04/27/2022)   [employer] of Occupational Health - Occupational Stress Questionnaire   . Feeling of Stress : Not at all  Social Connections: Moderately Integrated (04/27/2022)   Social Connection and Isolation Panel [NHANES]   . Frequency of Communication with Friends and Family: More than three times a week   .  Frequency of Social Gatherings with Friends and Family: More than three times a week   . Attends Religious Services: More than 4 times per year   . Active Member of Clubs or Organizations: Yes   . Attends BankerClub or Organization Meetings: More than 4 times per year   . Marital Status: Separated  Intimate Partner Violence: Not At Risk (04/27/2022)   Humiliation, Afraid, Rape, and Kick questionnaire   . Fear of Current or Ex-Partner: No   . Emotionally Abused: No   . Physically Abused: No   . Sexually Abused: No   Family History  Problem Relation Age of Onset  . Cancer Mother        unsure of type  . Cancer Father        unsure of type  . Colon cancer Neg Hx   . Esophageal cancer Neg Hx   . Rectal cancer Neg Hx   . Stomach cancer Neg Hx   . Breast cancer Neg Hx       Review of Systems  All other systems reviewed and are negative.      Objective:   Physical Exam Vitals reviewed.  Constitutional:      General: She is not in acute distress.    Appearance: She is well-developed. She is not diaphoretic.  HENT:     Head: Normocephalic and atraumatic.     Nose: Mucosal edema, congestion and rhinorrhea present.     Right Turbinates: Swollen and pale.     Left Turbinates: Swollen and pale.     Right Sinus: No maxillary sinus tenderness or frontal sinus tenderness.     Left Sinus: No maxillary sinus tenderness or  frontal sinus tenderness.     Mouth/Throat:     Pharynx: No pharyngeal swelling, oropharyngeal exudate or posterior oropharyngeal erythema.  Neck:     Thyroid: No thyromegaly.     Vascular: No JVD.     Trachea: No tracheal deviation.  Cardiovascular:     Rate and Rhythm: Normal rate and regular rhythm.     Heart sounds: Normal heart sounds. No murmur heard.    No friction rub. No gallop.  Pulmonary:     Effort: Pulmonary effort is normal. No respiratory distress.     Breath sounds: Normal breath sounds. No stridor. No wheezing or rales.  Chest:     Chest wall: No tenderness.  Musculoskeletal:     Left foot: Tenderness and bony tenderness present.       Feet:  Neurological:     Mental Status: She is alert.     Motor: No abnormal muscle tone.  Psychiatric:        Mood and Affect: Mood normal.        Behavior: Behavior normal.        Thought Content: Thought content normal.        Judgment: Judgment normal.          Assessment & Plan:  Acute non-recurrent sinusitis, unspecified location  Plantar fasciitis of left foot Patient has plantar fasciitis in the left foot.  We discussed a night splint which she can wear for plantar fasciitis.  We also discussed stretches she can perform.  We will try if she did cortisone injection in the heel.  I also believe she has sinusitis most likely due to allergies.  I will start her on the prednisone in addition for this to help with drainage and postnasal drip that I feel could be causing the sore throat

## 2022-05-26 ENCOUNTER — Telehealth: Payer: Self-pay | Admitting: Family Medicine

## 2022-05-26 NOTE — Telephone Encounter (Signed)
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 Patient stated additional information is required which needs to state her FMLA should be long term due to the nature of her physical condition (patient stated it's unpredictable).  Paperwork placed on nurse's desk along with a copy of the first set of completed FMLA papers.  Patient requesting call when new forms completed and ready for pickup.  Please advise at [phone]

## 2022-06-07 ENCOUNTER — Ambulatory Visit: Payer: Medicare Other | Admitting: Orthopaedic Surgery

## 2022-06-09 ENCOUNTER — Other Ambulatory Visit: Payer: Self-pay

## 2022-06-09 ENCOUNTER — Ambulatory Visit (INDEPENDENT_AMBULATORY_CARE_PROVIDER_SITE_OTHER): Payer: Medicare HMO | Admitting: Orthopaedic Surgery

## 2022-06-09 ENCOUNTER — Other Ambulatory Visit (INDEPENDENT_AMBULATORY_CARE_PROVIDER_SITE_OTHER): Payer: Medicare HMO

## 2022-06-09 DIAGNOSIS — Z96652 Presence of left artificial knee joint: Secondary | ICD-10-CM

## 2022-06-09 NOTE — Telephone Encounter (Signed)
Pt advised forms are ready via identified voice mail, [phone]. Mjp,lpn

## 2022-06-09 NOTE — Progress Notes (Signed)
Office Visit Note   Patient: Kathryn Zavala           Date of Birth: 1955-05-24           MRN: [id number] Visit Date: 06/09/2022              Requested by: Donita Brooks, MD [facility] [facility] [address] [place],  [place] [address] PCP: Donita Brooks, MD   Assessment & Plan: Visit Diagnoses:  1. Presence of left artificial knee joint     Plan: Impression is 67 year old female with a painful left total knee replacement.  I am concerned about loosening of the tibial component based on radiographic findings.  Will obtain inflammatory markers and bone scan to look for loosening and/or infection.  Follow-up after the studies.  Follow-Up Instructions: No follow-ups on file.   Orders:  Orders Placed This Encounter  Procedures   XR KNEE 3 VIEW LEFT   No orders of the defined types were placed in this encounter.     Procedures: No procedures performed   Clinical Data: No additional findings.   Subjective: Chief Complaint  Patient presents with   Left Knee - Pain    HPI Kathryn Zavala is a very pleasant 67 year old female here for evaluation of chronic left knee replacement that was done in [place] about 5 years ago.  The tibial component had to be revised for aseptic loosening about a year or 2 after the primary surgery.  She states that she had a period of 2 months of pain relief after the revision but then the pain returned.  Denies any constitutional symptoms.  Takes Tylenol for the pain as needed.  She is on her feet a lot for work.  She endorses pain across the top of the tibia. Review of Systems  Constitutional: Negative.   HENT: Negative.    Eyes: Negative.   Respiratory: Negative.    Cardiovascular: Negative.   Endocrine: Negative.   Musculoskeletal: Negative.   Neurological: Negative.   Hematological: Negative.   Psychiatric/Behavioral: Negative.    All other systems reviewed and are negative.    Objective: Vital Signs: There were no vitals taken for this  visit.  Physical Exam Vitals and nursing note reviewed.  Constitutional:      Appearance: She is well-developed.  HENT:     Head: Atraumatic.     Nose: Nose normal.  Eyes:     Extraocular Movements: Extraocular movements intact.  Cardiovascular:     Pulses: Normal pulses.  Pulmonary:     Effort: Pulmonary effort is normal.  Abdominal:     Palpations: Abdomen is soft.  Musculoskeletal:     Cervical back: Neck supple.  Skin:    General: Skin is warm.     Capillary Refill: Capillary refill takes less than 2 seconds.  Neurological:     Mental Status: She is alert. Mental status is at baseline.  Psychiatric:        Behavior: Behavior normal.        Thought Content: Thought content normal.        Judgment: Judgment normal.     Ortho Exam Emanation of the left knee shows a fully healed surgical scar.  She has excellent range of motion.  Good varus valgus stability.  No evidence of infection.  No joint effusion. Specialty Comments:  No specialty comments available.  Imaging: XR KNEE 3 VIEW LEFT  Result Date: 06/09/2022 X-rays demonstrate status post left total knee replacement with a revision tibial  component with a long press-fit stem.  There is varus remodeling of the tibial component.  There is lucency between the interface between the cement and the bone concerning for loosening.    PMFS History: Patient Active Problem List   Diagnosis Date Noted   Left leg pain 08/07/2019   Gait abnormality 08/07/2019   Stroke (HCC) 10/29/2017   Depression 10/29/2017   Paronychia of finger of right hand 12/21/2016   Presence of left artificial knee joint 09/04/2013   Knee pain 11/20/2012   Colon cancer (HCC) 07/16/2012   Metastasis to liver (HCC) 03/09/2009   Past Medical History:  Diagnosis Date   Cancer (HCC)    2010 Colon with mets to the liver, partial colectomy, chemo, and liver met resection   Cataract    Left leg pain    numbness   Stroke (HCC)    Pt reports that she  thinks she had a stroke in her late 60s, early 30s.    Family History  Problem Relation Age of Onset   Cancer Mother        unsure of type   Cancer Father        unsure of type   Colon cancer Neg Hx    Esophageal cancer Neg Hx    Rectal cancer Neg Hx    Stomach cancer Neg Hx    Breast cancer Neg Hx     Past Surgical History:  Procedure Laterality Date   ABDOMINAL HYSTERECTOMY     due to fibroids at 16, [facility] BSO   COLON SURGERY     COLONOSCOPY     JOINT REPLACEMENT     TKR x 2 on left knee   LIVER SURGERY     Social History   Occupational History   Occupation: Production designer, theatre/television/film  Tobacco Use   Smoking status: Former    Types: Cigarettes   Smokeless tobacco: Never   Tobacco comments:    high school days  Vaping Use   Vaping Use: Never used  Substance and Sexual Activity   Alcohol use: Yes    Comment: beer every other day   Drug use: No   Sexual activity: Not on file

## 2022-06-09 NOTE — Addendum Note (Signed)
Addended by: Hans Eden on: 06/09/2022 12:46 PM   Modules accepted: Orders

## 2022-06-10 LAB — SEDIMENTATION RATE: Sed Rate: 114 mm/h — ABNORMAL HIGH (ref 0–30)

## 2022-06-10 LAB — C-REACTIVE PROTEIN: CRP: 11.8 mg/L — ABNORMAL HIGH (ref <8.0)

## 2022-06-14 ENCOUNTER — Encounter: Payer: Self-pay | Admitting: Orthopaedic Surgery

## 2022-06-14 ENCOUNTER — Other Ambulatory Visit: Payer: Self-pay | Admitting: Physician Assistant

## 2022-06-14 MED ORDER — DIAZEPAM 2 MG PO TABS: ORAL_TABLET | ORAL | 0 refills | Status: DC

## 2022-06-14 NOTE — Telephone Encounter (Signed)
Sent in valium

## 2022-06-28 ENCOUNTER — Encounter (HOSPITAL_COMMUNITY)
Admission: RE | Admit: 2022-06-28 | Discharge: 2022-06-28 | Disposition: A | Discharge status: Home / Self Care | Payer: Medicare HMO | Source: Ambulatory Visit | Attending: Orthopaedic Surgery | Admitting: Orthopaedic Surgery

## 2022-06-28 DIAGNOSIS — Z96652 Presence of left artificial knee joint: Secondary | ICD-10-CM | POA: Insufficient documentation

## 2022-06-28 MED ORDER — TECHNETIUM TC 99M MEDRONATE IV KIT
20.0000 | PACK | Freq: Once | INTRAVENOUS | Status: AC | PRN
  Administered 2022-06-28: 21.5 via INTRAVENOUS

## 2022-06-29 NOTE — Progress Notes (Signed)
Needs follow-up to review bone scan.  Thank you.

## 2022-07-05 ENCOUNTER — Ambulatory Visit: Payer: Medicare HMO | Admitting: Orthopaedic Surgery

## 2022-07-05 ENCOUNTER — Encounter: Payer: Self-pay | Admitting: Orthopaedic Surgery

## 2022-07-05 DIAGNOSIS — Z96652 Presence of left artificial knee joint: Secondary | ICD-10-CM

## 2022-07-05 DIAGNOSIS — H5213 Myopia, bilateral: Secondary | ICD-10-CM | POA: Diagnosis not present

## 2022-07-05 MED ORDER — BUPIVACAINE HCL 0.5 % IJ SOLN
2.0000 mL | INTRAMUSCULAR | Status: AC | PRN
  Administered 2022-07-05: 2 mL via INTRA_ARTICULAR

## 2022-07-05 MED ORDER — LIDOCAINE HCL 1 % IJ SOLN
2.0000 mL | INTRAMUSCULAR | Status: AC | PRN
  Administered 2022-07-05: 2 mL

## 2022-07-05 NOTE — Progress Notes (Signed)
Office Visit Note   Patient: Kathryn Zavala           Date of Birth: 1955-09-21           MRN: [id number] Visit Date: 07/05/2022              Requested by: Donita Brooks, MD [address] [facility] [address] [place],  [place] [address] PCP: Donita Brooks, MD   Assessment & Plan: Visit Diagnoses:  1. Presence of left artificial knee joint     Plan: Inflammatory markers were both elevated and the bone scan is consistent with loosening of the tibial implant.  Based on these findings knee aspiration was performed which yielded 23 cc of fluid.  We will send this for [facility] analysis.  Follow-Up Instructions: No follow-ups on file.   Orders:  No orders of the defined types were placed in this encounter.  No orders of the defined types were placed in this encounter.     Procedures: Large Joint Inj: L knee on 07/05/2022 9:13 AM Details: 22 G needle Medications: 2 mL bupivacaine 0.5 %; 2 mL lidocaine 1 % Outcome: tolerated well, no immediate complications Patient was prepped and draped in the usual sterile fashion.       Clinical Data: No additional findings.   Subjective: Chief Complaint  Patient presents with   Left Knee - Follow-up    Bone Scan Review    HPI [name] returns today to review bone scan and inflammatory markers. Review of Systems   Objective: Vital Signs: There were no vitals taken for this visit.  Physical Exam  Ortho Exam Exam of the left knee is unchanged. Specialty Comments:  No specialty comments available.  Imaging: No results found.   PMFS History: Patient Active Problem List   Diagnosis Date Noted   Left leg pain 08/07/2019   Gait abnormality 08/07/2019   Stroke (HCC) 10/29/2017   Depression 10/29/2017   Paronychia of finger of right hand 12/21/2016   Presence of left artificial knee joint 09/04/2013   Knee pain 11/20/2012   Colon cancer (HCC) 07/16/2012   Metastasis to liver (HCC) 03/09/2009   Past Medical History:   Diagnosis Date   Cancer (HCC)    2010 Colon with mets to the liver, partial colectomy, chemo, and liver met resection   Cataract    Left leg pain    numbness   Stroke (HCC)    Pt reports that she thinks she had a stroke in her late 3s, early 30s.    Family History  Problem Relation Age of Onset   Cancer Mother        unsure of type   Cancer Father        unsure of type   Colon cancer Neg Hx    Esophageal cancer Neg Hx    Rectal cancer Neg Hx    Stomach cancer Neg Hx    Breast cancer Neg Hx     Past Surgical History:  Procedure Laterality Date   ABDOMINAL HYSTERECTOMY     due to fibroids at 86, [facility] BSO   COLON SURGERY     COLONOSCOPY     JOINT REPLACEMENT     TKR x 2 on left knee   LIVER SURGERY     Social History   Occupational History   Occupation: Production designer, theatre/television/film  Tobacco Use   Smoking status: Former    Types: Cigarettes   Smokeless tobacco: Never   Tobacco comments:  high school days  Vaping Use   Vaping Use: Never used  Substance and Sexual Activity   Alcohol use: Yes    Comment: beer every other day   Drug use: No   Sexual activity: Not on file

## 2022-07-21 ENCOUNTER — Ambulatory Visit (INDEPENDENT_AMBULATORY_CARE_PROVIDER_SITE_OTHER): Payer: Medicare HMO | Admitting: Family Medicine

## 2022-07-21 ENCOUNTER — Encounter: Payer: Self-pay | Admitting: Family Medicine

## 2022-07-21 VITALS — BP 120/76 | HR 66 | Temp 98.1°F | Ht 69.0 in | Wt 197.8 lb

## 2022-07-21 DIAGNOSIS — M722 Plantar fascial fibromatosis: Secondary | ICD-10-CM | POA: Diagnosis not present

## 2022-07-21 NOTE — Progress Notes (Signed)
Subjective:    Patient ID: Kathryn Zavala, female    DOB: [birth year], 67 y.o.   MRN: [id number]  Patient has plantar fasciitis in her left foot.  She reports severe pain in the heel of her foot on the plantar aspect where the plantar fascia inserts into the calcaneus.  It hurts like a sharp pain every time she gets out of bed in the morning.  It feels like a stabbing pain when she stands up from a seated position.  She has tried ibuprofen with no relief.  She states the pain is so bad that she has been to the point of tears recently Past Medical History:  Diagnosis Date   Cancer [facility])    2010 Colon with mets to the liver, partial colectomy, chemo, and liver met resection   Cataract    Left leg pain    numbness   Stroke (HCC)    Pt reports that she thinks she had a stroke in her late 11s, early 30s.    Past Surgical History:  Procedure Laterality Date   ABDOMINAL HYSTERECTOMY     due to fibroids at 32, T[facility] BSO   COLON SURGERY     COLONOSCOPY     JOINT REPLACEMENT     TKR x 2 on left knee   LIVER SURGERY     Current Outpatient Medications on File Prior to Visit  Medication Sig Dispense Refill   diazepam (VALIUM) 2 MG tablet Take one pill one hour prior to scan and then repeat just prior if needed 2 tablet 0   escitalopram (LEXAPRO) 20 MG tablet Take 1 tablet (20 mg total) by mouth daily. 90 tablet 3   No current facility-administered medications on file prior to visit.   Allergies  Allergen Reactions   Duloxetine Diarrhea, Nausea And Vomiting and Other (See Comments)    red blisters on face and arms   Social History   Socioeconomic History   Marital status: Domestic Partner    Spouse name: Malachi Bonds   Number of children: 5   Years of education: 12   Highest education level: High school graduate  Occupational History   Occupation: Production designer, theatre/television/film  Tobacco Use   Smoking status: Former    Types: Cigarettes   Smokeless tobacco: Never   Tobacco comments:    high  school days  Vaping Use   Vaping Use: Never used  Substance and Sexual Activity   Alcohol use: Yes    Comment: beer every other day   Drug use: No   Sexual activity: Not on file  Other Topics Concern   Not on file  Social History Narrative   Lives at home with her domestic partner, Malachi Bonds.   Right-handed.   Two cups coffee per day.   Works at [employer].   11 grandchildren.   Social Determinants of Health   Financial Resource Strain: Low Risk  (04/27/2022)   Overall Financial Resource Strain (CARDIA)    Difficulty of Paying Living Expenses: Not hard at all  Food Insecurity: No Food Insecurity (04/27/2022)   Hunger Vital Sign    Worried About Running Out of Food in the Last Year: Never true    Ran Out of Food in the Last Year: Never true  Transportation Needs: No Transportation Needs (04/27/2022)   PRAPARE - Administrator, Civil Service (Medical): No    Lack of Transportation (Non-Medical): No  Physical Activity: Sufficiently Active (04/27/2022)   Exercise Vital Sign  Days of Exercise per Week: 3 days    Minutes of Exercise per Session: 90 min  Stress: No Stress Concern Present (04/27/2022)   [employer] of Occupational Health - Occupational Stress Questionnaire    Feeling of Stress : Not at all  Social Connections: Moderately Integrated (04/27/2022)   Social Connection and Isolation Panel [NHANES]    Frequency of Communication with Friends and Family: More than three times a week    Frequency of Social Gatherings with Friends and Family: More than three times a week    Attends Religious Services: More than 4 times per year    Active Member of [employer] or Organizations: Yes    Attends Banker Meetings: More than 4 times per year    Marital Status: Separated  Intimate Partner Violence: Not At Risk (04/27/2022)   Humiliation, Afraid, Rape, and Kick questionnaire    Fear of Current or Ex-Partner: No    Emotionally Abused: No     Physically Abused: No    Sexually Abused: No   Family History  Problem Relation Age of Onset   Cancer Mother        unsure of type   Cancer Father        unsure of type   Colon cancer Neg Hx    Esophageal cancer Neg Hx    Rectal cancer Neg Hx    Stomach cancer Neg Hx    Breast cancer Neg Hx       Review of Systems  All other systems reviewed and are negative.      Objective:   Physical Exam Vitals reviewed.  Constitutional:      General: She is not in acute distress.    Appearance: She is well-developed. She is not diaphoretic.  HENT:     Head: Normocephalic and atraumatic.  Neck:     Thyroid: No thyromegaly.     Vascular: No JVD.     Trachea: No tracheal deviation.  Cardiovascular:     Rate and Rhythm: Normal rate and regular rhythm.     Pulses:          Dorsalis pedis pulses are 2+ on the left side.       Posterior tibial pulses are 2+ on the left side.     Heart sounds: Normal heart sounds. No murmur heard.    No friction rub. No gallop.  Pulmonary:     Effort: Pulmonary effort is normal. No respiratory distress.     Breath sounds: Normal breath sounds. No stridor. No wheezing or rales.  Chest:     Chest wall: No tenderness.  Musculoskeletal:     Left foot: No deformity, bunion or prominent metatarsal heads.       Feet:  Feet:     Left foot:     Skin integrity: No ulcer, blister, skin breakdown, erythema, warmth or callus.  Neurological:     Mental Status: She is alert.     Motor: No abnormal muscle tone.           Assessment & Plan:  Plantar fasciitis, left Patient has plantar fasciitis on the left foot.  Using sterile technique, I injected near the insertion of the plantar fascia on the calcaneus with a mixture of 1 cc of lidocaine and 1 cc of 40 mg/mm Kenalog.  The patient tolerated the procedure well.  There were no complications.  Recommended the patient rest over the weekend and return to work on Monday.  Try to stay  off the foot is much as  possible and apply ice to the affected area.  She can use ibuprofen as needed for pain

## 2022-07-28 ENCOUNTER — Ambulatory Visit: Payer: Medicare HMO | Admitting: Family Medicine

## 2022-07-31 MED ORDER — TRIAMCINOLONE ACETONIDE 40 MG/ML IJ SUSP
40.0000 mg | Freq: Once | INTRAMUSCULAR | Status: DC
  Administered 2022-07-21: 40 mg via INTRAMUSCULAR

## 2022-07-31 MED ORDER — LIDOCAINE HCL (PF) 1 % IJ SOLN
1.0000 mL | Freq: Once | INTRAMUSCULAR | Status: DC
  Administered 2022-07-21: 1 mL via INTRADERMAL

## 2022-07-31 NOTE — Addendum Note (Signed)
Addended by: Venia Carbon K on: 07/31/2022 09:30 AM   Modules accepted: Orders

## 2022-08-04 MED ORDER — TRIAMCINOLONE ACETONIDE 40 MG/ML IJ SUSP: 20.0000 mg | Freq: Once | INTRAMUSCULAR | Status: DC

## 2022-08-04 MED ORDER — LIDOCAINE HCL (PF) 1 % IJ SOLN: 1.0000 mL | Freq: Once | INTRAMUSCULAR | Status: DC

## 2022-08-04 NOTE — Addendum Note (Signed)
Addended by: Venia Carbon K on: 08/04/2022 04:45 PM   Modules accepted: Orders

## 2022-08-07 ENCOUNTER — Ambulatory Visit (INDEPENDENT_AMBULATORY_CARE_PROVIDER_SITE_OTHER): Payer: Medicare HMO

## 2022-08-07 ENCOUNTER — Ambulatory Visit (HOSPITAL_COMMUNITY)
Admission: EM | Admit: 2022-08-07 | Discharge: 2022-08-07 | Disposition: A | Discharge status: Home / Self Care | Payer: Medicare HMO | Attending: Urgent Care | Admitting: Urgent Care

## 2022-08-07 ENCOUNTER — Encounter (HOSPITAL_COMMUNITY): Payer: Self-pay

## 2022-08-07 DIAGNOSIS — M25572 Pain in left ankle and joints of left foot: Secondary | ICD-10-CM | POA: Diagnosis not present

## 2022-08-07 DIAGNOSIS — S93492A Sprain of other ligament of left ankle, initial encounter: Secondary | ICD-10-CM

## 2022-08-07 DIAGNOSIS — M7989 Other specified soft tissue disorders: Secondary | ICD-10-CM | POA: Diagnosis not present

## 2022-08-07 MED ORDER — ETODOLAC 500 MG PO TABS: 500.0000 mg | ORAL_TABLET | Freq: Two times a day (BID) | ORAL | 0 refills | Status: AC

## 2022-08-07 MED ORDER — IBUPROFEN 800 MG PO TABS
800.0000 mg | ORAL_TABLET | Freq: Once | ORAL | Status: AC
  Administered 2022-08-07: 800 mg via ORAL

## 2022-08-07 MED ORDER — IBUPROFEN 800 MG PO TABS
ORAL_TABLET | ORAL | Status: AC
  Filled 2022-08-07: qty 1

## 2022-08-07 NOTE — Discharge Instructions (Addendum)
Your xray is negative for an acute fracture. You have an ankle sprain, which is a stretching or tear of the ligaments in your ankle. Please start doing the exercises attached. Ice the ankle three times daily for the next three days. Elevate above the level of your heart.  Please take the anti-inflammatory medication called in today twice daily with food. Do not take any additional OTC NSAIDS (advil, motrin, ibuprofen, aleve, naproxen).    Please wear the air cast daily to prevent additional injuries. Wear this a minimum of two weeks, preferably three.  You can follow up with sports medicine, PT or acupuncture if symptoms are not improved or nearly resolved in the next 2 weeks.

## 2022-08-07 NOTE — ED Provider Notes (Signed)
[facility]    CSN: [id number] Arrival date & time: 08/07/22  1038      History   Chief Complaint Chief Complaint  Patient presents with   Foot Pain    HPI Rakita Jesser is a 67 y.o. female.   Pleasant 67 year old female presents to due to concerns of left ankle injury.  She states yesterday her left knee gave out causing her to fall down a stair.  She landed on the concrete, twisting her ankle.  She reports a mild scrape to her knees but her only concern at this time is her left ankle.  It is causing her significant pain to bear weight.  She reports full range of motion but a tightness and pulling sensation to the lateral aspect with dorsiflexion.  She denies sensation loss.  She reports significant bruising and discomfort to the lateral malleolus. No tx tried.    Foot Pain   Past Medical History:  Diagnosis Date   Cancer [facility])    2010 Colon with mets to the liver, partial colectomy, chemo, and liver met resection   Cataract    Left leg pain    numbness   Stroke (HCC)    Pt reports that she thinks she had a stroke in her late 29s, early 30s.    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      Home Medications    Prior to Admission medications   Medication Sig Start Date End Date Taking? Authorizing Provider  diazepam (VALIUM) 2 MG tablet Take one pill one hour prior to scan and  then repeat just prior if needed 06/14/22  Yes Jari Sportsman L, PA-C  escitalopram (LEXAPRO) 20 MG tablet Take 1 tablet (20 mg total) by mouth daily. 05/18/22  Yes Donita Brooks, MD  etodolac (LODINE) 500 MG tablet Take 1 tablet (500 mg total) by mouth 2 (two) times daily with a meal for 10 days. 08/07/22 08/17/22 Yes Layman Gully, Jodelle Gross, PA    Family History Family History  Problem Relation Age of Onset   Cancer Mother        unsure of type   Cancer Father        unsure of type   Colon cancer Neg Hx    Esophageal cancer Neg Hx    Rectal cancer Neg Hx    Stomach cancer Neg Hx    Breast cancer Neg Hx     Social History Social History   Tobacco Use   Smoking status: Former    Types: Cigarettes   Smokeless tobacco: Never   Tobacco comments:    high school days  Vaping Use   Vaping Use: Never used  Substance Use Topics   Alcohol use: Yes    Comment: beer  every other day   Drug use: No     Allergies   Duloxetine   Review of Systems Review of Systems As per HPI  Physical Exam Triage Vital Signs ED Triage Vitals [08/07/22 1151]  Enc Vitals Group     BP (!) 143/80     Pulse Rate 74     Resp 18     Temp 98.1 F (36.7 C)     Temp Source Oral     SpO2 94 %     Weight      Height      Head Circumference      Peak Flow      Pain Score      Pain Loc      Pain Edu?      Excl. in GC?    No data found.  Updated Vital Signs BP (!) 143/80 (BP Location: Left Arm)   Pulse 74   Temp 98.1 F (36.7 C) (Oral)   Resp 18   SpO2 94%   Visual Acuity Right Eye Distance:   Left Eye Distance:   Bilateral Distance:    Right Eye Near:   Left Eye Near:    Bilateral Near:     Physical Exam Vitals and nursing note reviewed. Exam conducted with a chaperone present.  Constitutional:      General: She is not in acute distress.    Appearance: Normal appearance. She is obese. She is not ill-appearing, toxic-appearing or diaphoretic.  HENT:     Head: Normocephalic and  atraumatic.  Cardiovascular:     Rate and Rhythm: Normal rate.  Pulmonary:     Effort: Pulmonary effort is normal. No respiratory distress.  Musculoskeletal:        General: Normal range of motion.     Cervical back: Normal range of motion and neck supple.     Right knee: Normal.     Left knee: Normal.     Right lower leg: Normal.     Left lower leg: Normal. No swelling, deformity, tenderness or bony tenderness. No edema.     Right ankle: Normal. No swelling, deformity, ecchymosis or lacerations. No tenderness. Normal range of motion.     Right Achilles Tendon: Normal. No tenderness or defects. Thompson's test negative.     Left ankle: Swelling present. No deformity, ecchymosis or lacerations. Tenderness present over the lateral malleolus, ATF ligament, AITF ligament, CF ligament and posterior TF ligament. No medial malleolus tenderness. Normal range of motion. Anterior drawer test negative. Normal pulse.     Left Achilles Tendon: Normal. No tenderness or defects. Thompson's test negative.     Right foot: Normal. Normal range of motion and normal capillary refill. No swelling, deformity, prominent metatarsal heads, laceration, tenderness, bony tenderness or crepitus. Normal pulse.     Left foot: Normal range of motion and normal capillary refill. Swelling present. No deformity, prominent metatarsal heads, laceration, tenderness, bony tenderness or crepitus. Normal pulse.       Feet:  Skin:    General: Skin is warm.     Capillary Refill: Capillary refill takes less than 2 seconds.     Findings: No bruising, erythema, lesion or rash.  Neurological:     General: No focal deficit present.     Mental Status: She is alert and oriented to person, place, and time. Mental status is at baseline.     Cranial Nerves: No cranial nerve deficit.     Sensory: No sensory deficit.  Motor: No weakness.     Coordination: Coordination normal.     Gait: Gait normal.  Psychiatric:        Mood and  Affect: Mood normal.      UC Treatments / Results  Labs (all labs ordered are listed, but only abnormal results are displayed) Labs Reviewed - No data to display  EKG   Radiology DG Ankle Complete Left  Result Date: 08/07/2022 CLINICAL DATA:  Fall, left ankle pain and swelling EXAM: LEFT ANKLE COMPLETE - 3+ VIEW COMPARISON:  None Available. FINDINGS: There is no evidence of fracture, dislocation, or joint effusion. There is no evidence of arthropathy or other focal bone abnormality. Soft tissue swelling about the ankle prominent about the lateral malleolus. IMPRESSION: 1. No acute fracture or dislocation. 2. Soft tissue swelling about the ankle prominent about the lateral malleolus. Electronically Signed   By: Larose Hires D.O.   On: 08/07/2022 12:56    Procedures Procedures (including critical care time)  Medications Ordered in UC Medications  ibuprofen (ADVIL) tablet 800 mg (800 mg Oral Given 08/07/22 1255)    Initial Impression / Assessment and Plan / UC Course  I have reviewed the triage vital signs and the nursing notes.  Pertinent labs & imaging results that were available during my care of the patient were reviewed by me and considered in my medical decision making (see chart for details).     Sprain of L ankle - no acute fx noted on xray. Suspect grade 2 ankle sprain.  Patient placed in Aircast.  Recommended ice, elevation, limited ambulation for the next 3 days.  Patient does work for [employer], can ambulate as tolerated after her 3 days off.  NSAID given today, follow-up with sports med should symptoms persist greater than 2 weeks.   Final Clinical Impressions(s) / UC Diagnoses   Final diagnoses:  Sprain of other ligament of left ankle, initial encounter     Discharge Instructions      Your xray is negative for an acute fracture. You have an ankle sprain, which is a stretching or tear of the ligaments in your ankle. Please start doing the exercises  attached. Ice the ankle three times daily for the next three days. Elevate above the level of your heart.  Please take the anti-inflammatory medication called in today twice daily with food. Do not take any additional OTC NSAIDS (advil, motrin, ibuprofen, aleve, naproxen).    Please wear the air cast daily to prevent additional injuries. Wear this a minimum of two weeks, preferably three.  You can follow up with sports medicine, PT or acupuncture if symptoms are not improved or nearly resolved in the next 2 weeks.       ED Prescriptions     Medication Sig Dispense Auth. Provider   etodolac (LODINE) 500 MG tablet Take 1 tablet (500 mg total) by mouth 2 (two) times daily with a meal for 10 days. 20 tablet Asiya Cutbirth L, [place]      PDMP not reviewed this encounter.   Maretta Bees, [place] 08/07/22 1416

## 2022-08-07 NOTE — ED Triage Notes (Signed)
Pt presents to office for fall yesterday. Pt reports left foot pain. Denies any LOC.

## 2022-08-24 ENCOUNTER — Ambulatory Visit: Payer: Medicare HMO | Admitting: Orthopaedic Surgery

## 2022-09-05 DIAGNOSIS — H524 Presbyopia: Secondary | ICD-10-CM | POA: Diagnosis not present

## 2022-10-03 ENCOUNTER — Ambulatory Visit (INDEPENDENT_AMBULATORY_CARE_PROVIDER_SITE_OTHER): Payer: Medicare HMO | Admitting: Family Medicine

## 2022-10-03 VITALS — HR 77 | Temp 98.0°F | Ht 69.0 in | Wt 194.4 lb

## 2022-10-03 DIAGNOSIS — R0602 Shortness of breath: Secondary | ICD-10-CM | POA: Diagnosis not present

## 2022-10-03 DIAGNOSIS — R0600 Dyspnea, unspecified: Secondary | ICD-10-CM

## 2022-10-03 DIAGNOSIS — F5101 Primary insomnia: Secondary | ICD-10-CM | POA: Diagnosis not present

## 2022-10-03 DIAGNOSIS — M722 Plantar fascial fibromatosis: Secondary | ICD-10-CM | POA: Diagnosis not present

## 2022-10-03 MED ORDER — ZOLPIDEM TARTRATE 10 MG PO TABS: 10.0000 mg | ORAL_TABLET | Freq: Every evening | ORAL | 1 refills | Status: AC | PRN

## 2022-10-03 NOTE — Progress Notes (Signed)
Subjective:    Patient ID: Kathryn Zavala, female    DOB: Jul 06, 1955, 67 y.o.   MRN: [id number]  Patient presents today complaining of shortness of breath.  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  There is been a tremendous amount of dust in the air.  She reports shortness of breath with activity and at rest.  She denies any chest pain.  She denies any angina.  She denies any fevers or chills.  She denies any purulent sputum or cough.  She does report trouble sleeping.  She does report fatigue.  She also continues to have pain in her foot where she is suffering from plantar fasciitis.  I previously tried a cortisone injection in that area but she received very little benefit from that.  On exam today, she has markedly diminished breath sounds bilaterally.  There are no crackles.  There are no rails.  I do not appreciate any wheezing however she does have very little air movement. Past Medical History:  Diagnosis Date   Cancer [facility])    2010 Colon with mets to the liver, partial colectomy, chemo, and liver met resection   Cataract    Left leg pain    numbness   Stroke (HCC)    Pt reports that she thinks she had a stroke in her late 81s, early 30s.    Past Surgical History:  Procedure Laterality Date   ABDOMINAL HYSTERECTOMY     due to fibroids at 60, T[facility] BSO   COLON SURGERY     COLONOSCOPY     JOINT REPLACEMENT     TKR x 2 on left knee   LIVER SURGERY     Current Outpatient Medications on File Prior to Visit  Medication Sig Dispense Refill   escitalopram (LEXAPRO) 20 MG tablet Take 1 tablet (20 mg total) by mouth daily. 90 tablet 3   diazepam (VALIUM) 2 MG tablet Take one pill one hour prior to scan and then repeat just prior if needed (Patient not taking: Reported on 10/03/2022) 2 tablet 0   Current Facility-Administered Medications on File Prior to Visit  Medication Dose Route Frequency Provider Last Rate Last Admin   lidocaine (PF)  (XYLOCAINE) 1 % injection 1 mL  1 mL Other Once Donita Brooks, MD       triamcinolone acetonide (KENALOG-40) injection 20 mg  20 mg Intra-articular Once Donita Brooks, MD       Allergies  Allergen Reactions   Duloxetine Diarrhea, Nausea And Vomiting and Other (See Comments)    red blisters on face and arms   Social History   Socioeconomic History   Marital status: Domestic Partner    Spouse name: Malachi Bonds   Number of children: 5   Years of education: 12   Highest education level: High school graduate  Occupational History   Occupation: Production designer, theatre/television/film  Tobacco Use   Smoking status: Former    Types: Cigarettes   Smokeless tobacco: Never   Tobacco comments:    high school days  Vaping Use   Vaping status: Never Used  Substance and Sexual Activity   Alcohol use: Yes    Comment: beer every other day   Drug use: No   Sexual activity: Not on file  Other Topics Concern   Not on file  Social History Narrative   Lives at home with her domestic partner, Malachi Bonds.   Right-handed.   Two cups coffee per day.  Works at [employer].   11 grandchildren.   Social Determinants of Health   Financial Resource Strain: Low Risk  (04/27/2022)   Overall Financial Resource Strain (CARDIA)    Difficulty of Paying Living Expenses: Not hard at all  Food Insecurity: No Food Insecurity (04/27/2022)   Hunger Vital Sign    Worried About Running Out of Food in the Last Year: Never true    Ran Out of Food in the Last Year: Never true  Transportation Needs: No Transportation Needs (04/27/2022)   PRAPARE - Administrator, Civil Service (Medical): No    Lack of Transportation (Non-Medical): No  Physical Activity: Sufficiently Active (04/27/2022)   Exercise Vital Sign    Days of Exercise per Week: 3 days    Minutes of Exercise per Session: 90 min  Stress: No Stress Concern Present (04/27/2022)   [employer] of Occupational Health - Occupational Stress Questionnaire     Feeling of Stress : Not at all  Social Connections: Moderately Integrated (04/27/2022)   Social Connection and Isolation Panel [NHANES]    Frequency of Communication with Friends and Family: More than three times a week    Frequency of Social Gatherings with Friends and Family: More than three times a week    Attends Religious Services: More than 4 times per year    Active Member of [employer] or Organizations: Yes    Attends Banker Meetings: More than 4 times per year    Marital Status: Separated  Intimate Partner Violence: Not At Risk (04/27/2022)   Humiliation, Afraid, Rape, and Kick questionnaire    Fear of Current or Ex-Partner: No    Emotionally Abused: No    Physically Abused: No    Sexually Abused: No   Family History  Problem Relation Age of Onset   Cancer Mother        unsure of type   Cancer Father        unsure of type   Colon cancer Neg Hx    Esophageal cancer Neg Hx    Rectal cancer Neg Hx    Stomach cancer Neg Hx    Breast cancer Neg Hx       Review of Systems  All other systems reviewed and are negative.      Objective:   Physical Exam Vitals reviewed.  Constitutional:      General: She is not in acute distress.    Appearance: She is well-developed. She is not diaphoretic.  HENT:     Head: Normocephalic and atraumatic.  Neck:     Thyroid: No thyromegaly.     Vascular: No JVD.     Trachea: No tracheal deviation.  Cardiovascular:     Rate and Rhythm: Normal rate and regular rhythm.     Pulses:          Dorsalis pedis pulses are 2+ on the left side.       Posterior tibial pulses are 2+ on the left side.     Heart sounds: Normal heart sounds. No murmur heard.    No friction rub. No gallop.  Pulmonary:     Effort: Pulmonary effort is normal. No tachypnea, accessory muscle usage, prolonged expiration or respiratory distress.     Breath sounds: Decreased air movement present. No stridor. Decreased breath sounds present. No wheezing or  rales.  Chest:     Chest wall: No tenderness.  Musculoskeletal:     Left foot: No deformity, bunion or prominent  metatarsal heads.       Feet:  Feet:     Left foot:     Skin integrity: No ulcer, blister, skin breakdown, erythema, warmth or callus.  Neurological:     Mental Status: She is alert.     Motor: No abnormal muscle tone.           Assessment & Plan:  SOB (shortness of breath) - Plan: CBC with Differential/Platelet, COMPLETE METABOLIC PANEL WITH GFR, Brain natriuretic peptide, DG Chest 2 View  Plantar fasciitis, left  Primary insomnia I will treat the patient's insomnia with Ambien 10 mg p.o. nightly as needed.  I recommended she take this nightly for 1 week to try to reestablish a normal sleeping pattern.  She goes to work and works third shift.  She gets up at 2:30 in the morning.  By the time she gets home she is unable to sleep.  She tosses and turns till 7 AM then falls asleep for 1 hour and then wakes up at 10 is making her extremely fatigued.  I am hoping that I can reestablish a normal sleep pattern for 6 hours every morning from 4 AM to 10 AM.  I also recommended a white noise generator and a fan blowing on her face every day when she tries to sleep to help facilitate the process.  Once she is doing better we will wean off the Ambien.  The patient saw no benefit from the albuterol nebulizer treatment that I gave her here in the office today.  I gave her 2.5 mg of albuterol x 1.  She saw no improvement in the shortness of breath.  Therefore I will start the workup by checking a CBC, CMP, and a BNP.  I will also check a chest x-ray.  The x-ray is clear and the lab work is normal, I would recommend getting an echocardiogram given her shortness of breath.  She does have a family history of coronary artery disease and congestive heart failure in her mother.  Last I will refer her to podiatry for plantar fasciitis

## 2022-10-04 ENCOUNTER — Ambulatory Visit
Admission: RE | Admit: 2022-10-04 | Discharge: 2022-10-04 | Disposition: A | Discharge status: Home / Self Care | Payer: Medicare HMO | Source: Ambulatory Visit | Attending: Family Medicine | Admitting: Family Medicine

## 2022-10-04 DIAGNOSIS — R0602 Shortness of breath: Secondary | ICD-10-CM

## 2022-10-04 LAB — COMPLETE METABOLIC PANEL WITH GFR
AG Ratio: 1.5 (calc) (ref 1.0–2.5)
ALT: 23 U/L (ref 6–29)
AST: 26 U/L (ref 10–35)
Albumin: 4.4 g/dL (ref 3.6–5.1)
Alkaline phosphatase (APISO): 81 U/L (ref 37–153)
BUN: 18 mg/dL (ref 7–25)
CO2: 26 mmol/L (ref 20–32)
Calcium: 9.4 mg/dL (ref 8.6–10.4)
Chloride: 104 mmol/L (ref 98–110)
Creat: 0.92 mg/dL (ref 0.50–1.05)
Globulin: 3 g/dL (calc) (ref 1.9–3.7)
Glucose, Bld: 83 mg/dL (ref 65–99)
Potassium: 4.1 mmol/L (ref 3.5–5.3)
Sodium: 140 mmol/L (ref 135–146)
Total Bilirubin: 0.5 mg/dL (ref 0.2–1.2)
Total Protein: 7.4 g/dL (ref 6.1–8.1)
eGFR: 68 mL/min/{1.73_m2} (ref >=60)

## 2022-10-04 LAB — CBC WITH DIFFERENTIAL/PLATELET
Absolute Monocytes: 603 {cells}/uL (ref 200–950)
Basophils Absolute: 52 {cells}/uL (ref 0–200)
Basophils Relative: 0.5 %
Eosinophils Absolute: 125 {cells}/uL (ref 15–500)
Eosinophils Relative: 1.2 %
HCT: 41.2 % (ref 35.0–45.0)
Hemoglobin: 13.7 g/dL (ref 11.7–15.5)
Lymphs Abs: 2725 {cells}/uL (ref 850–3900)
MCH: 30.9 pg (ref 27.0–33.0)
MCHC: 33.3 g/dL (ref 32.0–36.0)
MCV: 93 fL (ref 80.0–100.0)
MPV: 10.7 fL (ref 7.5–12.5)
Monocytes Relative: 5.8 %
Neutro Abs: 6895 {cells}/uL (ref 1500–7800)
Neutrophils Relative %: 66.3 %
Platelets: 240 10*3/uL (ref 140–400)
RBC: 4.43 10*6/uL (ref 3.80–5.10)
RDW: 12.4 % (ref 11.0–15.0)
Total Lymphocyte: 26.2 %
WBC: 10.4 10*3/uL (ref 3.8–10.8)

## 2022-10-04 LAB — BRAIN NATRIURETIC PEPTIDE: Brain Natriuretic Peptide: 6 pg/mL (ref <100)

## 2022-10-13 ENCOUNTER — Other Ambulatory Visit: Payer: Self-pay

## 2022-10-13 DIAGNOSIS — R0602 Shortness of breath: Secondary | ICD-10-CM

## 2022-10-17 ENCOUNTER — Ambulatory Visit (INDEPENDENT_AMBULATORY_CARE_PROVIDER_SITE_OTHER): Payer: Medicare HMO

## 2022-10-17 ENCOUNTER — Ambulatory Visit: Payer: Medicare HMO | Admitting: Podiatry

## 2022-10-17 ENCOUNTER — Encounter: Payer: Self-pay | Admitting: Podiatry

## 2022-10-17 DIAGNOSIS — M722 Plantar fascial fibromatosis: Secondary | ICD-10-CM

## 2022-10-17 MED ORDER — TRIAMCINOLONE ACETONIDE 40 MG/ML IJ SUSP
40.0000 mg | Freq: Once | INTRAMUSCULAR | Status: AC
Start: 2022-10-17 — End: 2022-10-17
  Administered 2022-10-17: 40 mg

## 2022-10-17 MED ORDER — MELOXICAM 15 MG PO TABS: 15.0000 mg | ORAL_TABLET | Freq: Every day | ORAL | 3 refills | Status: AC

## 2022-10-17 MED ORDER — METHYLPREDNISOLONE 4 MG PO TBPK: ORAL_TABLET | ORAL | 0 refills | Status: DC

## 2022-10-17 NOTE — Progress Notes (Signed)
Subjective:  Patient ID: Kathryn Zavala, female    DOB: 08-16-1955,  MRN: [id number] HPI Chief Complaint  Patient presents with   Foot Pain    Plantar heel bilateral (L>R) - aching x several months, AM pain, tried Ibuprofen, burning a lot recently   New Patient (Initial Visit)    67 y.o. female presents with the above complaint.   ROS: Denies fever chills nausea mobic muscle aches pains calf pain back pain chest pain shortness of breath.  Past Medical History:  Diagnosis Date   Cancer [facility])    2010 Colon with mets to the liver, partial colectomy, chemo, and liver met resection   Cataract    Left leg pain    numbness   Stroke (HCC)    Pt reports that she thinks she had a stroke in her late 39s, early 30s.   Past Surgical History:  Procedure Laterality Date   ABDOMINAL HYSTERECTOMY     due to fibroids at 50, TAH BSO   COLON SURGERY     COLONOSCOPY     JOINT REPLACEMENT     TKR x 2 on left knee   LIVER SURGERY      Current Outpatient Medications:    meloxicam (MOBIC) 15 MG tablet, Take 1 tablet (15 mg total) by mouth daily., Disp: 30 tablet, Rfl: 3   methylPREDNISolone (MEDROL DOSEPAK) 4 MG TBPK tablet, 6 day dose pack - take as directed, Disp: 21 tablet, Rfl: 0   escitalopram (LEXAPRO) 20 MG tablet, Take 1 tablet (20 mg total) by mouth daily., Disp: 90 tablet, Rfl: 3   zolpidem (AMBIEN) 10 MG tablet, Take 1 tablet (10 mg total) by mouth at bedtime as needed for sleep., Disp: 15 tablet, Rfl: 1  Current Facility-Administered Medications:    lidocaine (PF) (XYLOCAINE) 1 % injection 1 mL, 1 mL, Other, Once, Lynnea Ferrier T, MD   triamcinolone acetonide (KENALOG-40) injection 20 mg, 20 mg, Intra-articular, Once, Donita Brooks, MD  Allergies  Allergen Reactions   Duloxetine Diarrhea, Nausea And Vomiting and Other (See Comments)    red blisters on face and arms   Review of Systems Objective:  There were no vitals filed for this visit.  General: Well developed,  nourished, in no acute distress, alert and oriented x3   Dermatological: Skin is warm, dry and supple bilateral. Nails x 10 are well maintained; remaining integument appears unremarkable at this time. There are no open sores, no preulcerative lesions, no rash or signs of infection present.  Vascular: Dorsalis Pedis artery and Posterior Tibial artery pedal pulses are 2/4 bilateral with immedate capillary fill time. Pedal hair growth present. No varicosities and no lower extremity edema present bilateral.   Neruologic: Grossly intact via light touch bilateral. Vibratory intact via tuning fork bilateral. Protective threshold with Semmes Wienstein monofilament intact to all pedal sites bilateral. Patellar and Achilles deep tendon reflexes 2+ bilateral. No Babinski or clonus noted bilateral.   Musculoskeletal: No gross boney pedal deformities bilateral. No pain, crepitus, or limitation noted with foot and ankle range of motion bilateral. Muscular strength 5/5 in all groups tested bilateral.  Pain on palpation medial calcaneal tubercle bilateral.  Hallux abductovalgus deformity is noted bilateral.  Mild flexible hammertoe deformities.  Gait: Unassisted, Nonantalgic.    Radiographs:  Radiographs taken today demonstrate osseously mature individual good bone mineralization hallux abductovalgus deformity with mild hammertoe deformities is noted.  Mild pes planovalgus is noted bilateral.  Soft tissue increase in density plantar fascial calcaneal insertion sites bilateral.  Assessment & Plan:   Assessment: Plantar fasciitis bilateral hallux valgus forming hammertoe deformities.  Plan: Started her on methylprednisolone to be followed by meloxicam.  I injected the bilateral heels 20 mg Kenalog 5 mg Marcaine for maximal tenderness.  Tolerated procedure well discussed appropriate shoe gear stretching exercise ice therapy and shoe gear modifications.  I would like to follow-up with her in 1 month.  She works for  the [employer].     Kathryn Kirkland T. [place], [place]

## 2022-10-17 NOTE — Patient Instructions (Signed)

## 2022-10-18 ENCOUNTER — Ambulatory Visit (HOSPITAL_COMMUNITY): Payer: Medicare HMO | Attending: Family Medicine

## 2022-10-18 DIAGNOSIS — R0602 Shortness of breath: Secondary | ICD-10-CM | POA: Diagnosis not present

## 2022-10-18 LAB — ECHOCARDIOGRAM COMPLETE
Area-P 1/2: 3.06 cm2
S' Lateral: 2.5 cm

## 2022-10-26 ENCOUNTER — Ambulatory Visit: Payer: Medicare HMO | Admitting: Orthopaedic Surgery

## 2022-10-26 DIAGNOSIS — M2012 Hallux valgus (acquired), left foot: Secondary | ICD-10-CM | POA: Diagnosis not present

## 2022-10-26 DIAGNOSIS — Z96652 Presence of left artificial knee joint: Secondary | ICD-10-CM

## 2022-10-26 DIAGNOSIS — M2022 Hallux rigidus, left foot: Secondary | ICD-10-CM

## 2022-10-26 NOTE — Progress Notes (Signed)
Office Visit Note   Patient: Kathryn Zavala           Date of Birth: 1955-10-14           MRN: [id number] Visit Date: 10/26/2022              Requested by: Donita Brooks, MD [address] [facility] [address] [place],  [place] [address] PCP: Donita Brooks, MD   Assessment & Plan: Visit Diagnoses:  1. Presence of left artificial knee joint   2. Hallux rigidus, left foot   3. Hallux valgus (acquired), left foot     Plan: Kathryn Zavala is a 67 year old female with painful left total knee replacement from a loose tibial component.  This has been revised twice.  Workup was negative for infection.  She will think about her options and let us know if she wants to proceed with another revision.  I would likely refer her to a total joint fellowship surgeon she chooses to go down that route.  In regards to the left great toe she has hallux rigidus and hallux valgus.  We will try toe spacer and I have recommended a carbon insert.  Will see her back as needed.  Follow-Up Instructions: No follow-ups on file.   Orders:  No orders of the defined types were placed in this encounter.  No orders of the defined types were placed in this encounter.     Procedures: No procedures performed   Clinical Data: No additional findings.   Subjective: Chief Complaint  Patient presents with   Left Knee - Follow-up   Left Foot - Follow-up    HPI Kathryn Zavala returns today to follow-up for left prosthetic knee pain due to loosening of the tibial component and evaluation of a new problem of left great toe pain.  Review of Systems  Constitutional: Negative.   HENT: Negative.    Eyes: Negative.   Respiratory: Negative.    Cardiovascular: Negative.   Endocrine: Negative.   Musculoskeletal: Negative.   Neurological: Negative.   Hematological: Negative.   Psychiatric/Behavioral: Negative.    All other systems reviewed and are negative.    Objective: Vital Signs: There were no vitals taken for this  visit.  Physical Exam Vitals and nursing note reviewed.  Constitutional:      Appearance: She is well-developed.  HENT:     Head: Atraumatic.     Nose: Nose normal.  Eyes:     Extraocular Movements: Extraocular movements intact.  Cardiovascular:     Pulses: Normal pulses.  Pulmonary:     Effort: Pulmonary effort is normal.  Abdominal:     Palpations: Abdomen is soft.  Musculoskeletal:     Cervical back: Neck supple.  Skin:    General: Skin is warm.     Capillary Refill: Capillary refill takes less than 2 seconds.  Neurological:     Mental Status: She is alert. Mental status is at baseline.  Psychiatric:        Behavior: Behavior normal.        Thought Content: Thought content normal.        Judgment: Judgment normal.     Ortho Exam Examination of the left knee is unchanged.  Exam of the left great toe shows mild hallux valgus deformity.  She has pain with range of motion of the MTP joint.  Pain with grind test. Specialty Comments:  No specialty comments available.  Imaging: No results found.   PMFS History: Patient Active Problem List  Diagnosis Date Noted   Left leg pain 08/07/2019   Gait abnormality 08/07/2019   Stroke (HCC) 10/29/2017   Depression 10/29/2017   Paronychia of finger of right hand 12/21/2016   Presence of left artificial knee joint 09/04/2013   Knee pain 11/20/2012   Colon cancer (HCC) 07/16/2012   Metastasis to liver (HCC) 03/09/2009   Past Medical History:  Diagnosis Date   Cancer (HCC)    2010 Colon with mets to the liver, partial colectomy, chemo, and liver met resection   Cataract    Left leg pain    numbness   Stroke (HCC)    Pt reports that she thinks she had a stroke in her late 14s, early 30s.    Family History  Problem Relation Age of Onset   Cancer Mother        unsure of type   Cancer Father        unsure of type   Colon cancer Neg Hx    Esophageal cancer Neg Hx    Rectal cancer Neg Hx    Stomach cancer Neg Hx     Breast cancer Neg Hx     Past Surgical History:  Procedure Laterality Date   ABDOMINAL HYSTERECTOMY     due to fibroids at 51, [facility]   COLON SURGERY     COLONOSCOPY     JOINT REPLACEMENT     TKR x 2 on left knee   LIVER SURGERY     Social History   Occupational History   Occupation: Production designer, theatre/television/film  Tobacco Use   Smoking status: Former    Types: Cigarettes   Smokeless tobacco: Never   Tobacco comments:    high school days  Vaping Use   Vaping status: Never Used  Substance and Sexual Activity   Alcohol use: Yes    Comment: beer every other day   Drug use: No   Sexual activity: Not on file

## 2022-11-21 ENCOUNTER — Ambulatory Visit: Payer: Medicare HMO | Admitting: Podiatry

## 2022-11-21 ENCOUNTER — Encounter: Payer: Self-pay | Admitting: Podiatry

## 2022-11-21 DIAGNOSIS — S86012A Strain of left Achilles tendon, initial encounter: Secondary | ICD-10-CM | POA: Diagnosis not present

## 2022-11-21 DIAGNOSIS — S86312A Strain of muscle(s) and tendon(s) of peroneal muscle group at lower leg level, left leg, initial encounter: Secondary | ICD-10-CM | POA: Diagnosis not present

## 2022-11-21 DIAGNOSIS — S93692A Other sprain of left foot, initial encounter: Secondary | ICD-10-CM

## 2022-11-21 NOTE — Progress Notes (Signed)
She presents today for follow-up of her Planter fasciitis bilateral states that it really started back in January her PCP had provided her an injection provided her injection bilaterally steroids and nonsteroidals she states that it feels better for just a few days and then gets worse.  She states that since she saw [place] last her peroneal tendons hurt as she points to that area in the back of her foot hurts.  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  She states it has definitely worsened over the past several months.  Objective: Vital signs are stable alert oriented x 3.  Still has severe pain on palpation medial calcaneal tubercle of the medial band of the plantar fascia is nonpalpable she has tenderness on palpation of the peroneals just beneath the lateral malleolus and the insertion of the Achilles is warm to the touch and painful.  Reviewed radiographs once again not demonstrating any of the current findings other than soft tissue swelling of the plantar fascial insertion site.  Assessment: Cannot rule out a tear of the plantar fascia as a primary unit however I do believe that she probably has a tear of the peroneus brevis and possibly even an insertional tear of the Achilles.  Nonetheless these must have tendinosis or tendinopathy's at this point.  Plan: Discussed obtaining an MRI to rule out tear of the plantar fascia of the peroneal tendon particularly peroneus brevis and the insertional site of the Achilles particularly since some conservative therapy has failed to render her asymptomatic at this time.  She has tried multiple anti-inflammatories steroidal and nonsteroidal and multiple injections and bracing.  She is also tried shoe gear change.

## 2022-12-05 ENCOUNTER — Ambulatory Visit (HOSPITAL_COMMUNITY)
Admission: EM | Admit: 2022-12-05 | Discharge: 2022-12-05 | Disposition: A | Discharge status: Home / Self Care | Payer: Medicare HMO | Attending: Family Medicine | Admitting: Family Medicine

## 2022-12-05 ENCOUNTER — Encounter (HOSPITAL_COMMUNITY): Payer: Self-pay | Admitting: Emergency Medicine

## 2022-12-05 DIAGNOSIS — R051 Acute cough: Secondary | ICD-10-CM

## 2022-12-05 DIAGNOSIS — J111 Influenza due to unidentified influenza virus with other respiratory manifestations: Secondary | ICD-10-CM

## 2022-12-05 DIAGNOSIS — J029 Acute pharyngitis, unspecified: Secondary | ICD-10-CM

## 2022-12-05 LAB — POCT INFLUENZA A/B
Influenza A, POC: NEGATIVE
Influenza B, POC: NEGATIVE

## 2022-12-05 LAB — POCT RAPID STREP A (OFFICE): Rapid Strep A Screen: NEGATIVE

## 2022-12-05 MED ORDER — HYDROCODONE BIT-HOMATROP MBR 5-1.5 MG/5ML PO SOLN: 2.5000 mL | Freq: Four times a day (QID) | ORAL | 0 refills | Status: DC | PRN

## 2022-12-05 NOTE — ED Triage Notes (Signed)
Pt c/o sore throat, headache, lower back pain, cough, difficulty breathing, and fever for 2 days.

## 2022-12-05 NOTE — Discharge Instructions (Signed)
Be aware, your cough medication may cause drowsiness. Please do not drive, operate heavy machinery or make important decisions while on this medication, it can cloud your judgement.  You may use over the counter ibuprofen or acetaminophen as needed.  For a sore throat, over the counter products such as Colgate Peroxyl Mouth Sore Rinse or Chloraseptic Sore Throat Spray may provide some temporary relief. Your rapid strep test was negative today.

## 2022-12-05 NOTE — ED Provider Notes (Signed)
[facility]   [id number] 12/05/22 Arrival Time: 0950  ASSESSMENT & PLAN:  1. Sore throat   2. Influenza-like illness   3. Acute cough    Results for orders placed or performed during the hospital encounter of 12/05/22  POC Influenza A/B  Result Value Ref Range   Influenza A, POC Negative Negative   Influenza B, POC Negative Negative  POC rapid strep A  Result Value Ref Range   Rapid Strep A Screen Negative Negative   Discussed typical duration of likely viral illness. OTC symptom care as needed.  Discharge Medication List as of 12/05/2022 11:33 AM     START taking these medications   Details  HYDROcodone bit-homatropine (HYCODAN) 5-1.5 MG/5ML syrup Take 2.5-5 mLs by mouth every 6 (six) hours as needed for cough., Starting Tue 12/05/2022, Normal         Discharge Instructions      Be aware, your cough medication may cause drowsiness. Please do not drive, operate heavy machinery or make important decisions while on this medication, it can cloud your judgement.  You may use over the counter ibuprofen or acetaminophen as needed.  For a sore throat, over the counter products such as Colgate Peroxyl Mouth Sore Rinse or Chloraseptic Sore Throat Spray may provide some temporary relief. Your rapid strep test was negative today.        Follow-up Information     Donita Brooks, MD.   Specialty: Family Medicine Why: As needed. Contact information: [address] [place] [address] [phone]                 Reviewed expectations re: course of current medical issues. Questions answered. Outlined signs and symptoms indicating need for more acute intervention. Understanding verbalized. After Visit Summary given.   SUBJECTIVE: History from: Patient. Kathryn Zavala is a 67 y.o. female. Pt c/o sore throat, headache, lower back pain, cough; abrupt onset; x 2 days. Ques fever for 2 days. Denies: difficulty breathing. Normal PO  intake without n/v/d.  OBJECTIVE:  Vitals:   12/05/22 1010  BP: 116/75  Pulse: 80  Resp: 17  Temp: 98.4 F (36.9 C)  TempSrc: Oral  SpO2: 95%    General appearance: alert; no distress Eyes: PERRLA; EOMI; conjunctiva normal HENT: [place]; AT; with nasal congestion; throat with erythema and cobblestoning Neck: supple with small cerv LAD bilat Lungs: speaks full sentences without difficulty; unlabored; ctab; dry cough Extremities: no edema Skin: warm and dry Neurologic: normal gait Psychological: alert and cooperative; normal mood and affect  Labs: Results for orders placed or performed during the hospital encounter of 12/05/22  POC Influenza A/B  Result Value Ref Range   Influenza A, POC Negative Negative   Influenza B, POC Negative Negative  POC rapid strep A  Result Value Ref Range   Rapid Strep A Screen Negative Negative   Labs Reviewed  POCT INFLUENZA A/B  POCT RAPID STREP A (OFFICE)    Imaging: No results found.  Allergies  Allergen Reactions   Duloxetine Diarrhea, Nausea And Vomiting and Other (See Comments)    red blisters on face and arms    Past Medical History:  Diagnosis Date   Cancer (HCC)    2010 Colon with mets to the liver, partial colectomy, chemo, and liver met resection   Cataract    Left leg pain    numbness   Stroke [facility])    Pt reports that she thinks she had a stroke in her  late 110s, early 30s.   Social History   Socioeconomic History   Marital status: Media planner    Spouse name: Malachi Bonds   Number of children: 5   Years of education: 12   Highest education level: High school graduate  Occupational History   Occupation: Production designer, theatre/television/film  Tobacco Use   Smoking status: Former    Types: Cigarettes   Smokeless tobacco: Never   Tobacco comments:    high school days  Vaping Use   Vaping status: Never Used  Substance and Sexual Activity   Alcohol use: Yes    Comment: beer every other day   Drug use: No   Sexual activity: Not  on file  Other Topics Concern   Not on file  Social History Narrative   Lives at home with her domestic partner, Malachi Bonds.   Right-handed.   Two cups coffee per day.   Works at [employer].   11 grandchildren.   Social Determinants of Health   Financial Resource Strain: Low Risk  (04/27/2022)   Overall Financial Resource Strain (CARDIA)    Difficulty of Paying Living Expenses: Not hard at all  Food Insecurity: No Food Insecurity (04/27/2022)   Hunger Vital Sign    Worried About Running Out of Food in the Last Year: Never true    Ran Out of Food in the Last Year: Never true  Transportation Needs: No Transportation Needs (04/27/2022)   PRAPARE - Administrator, Civil Service (Medical): No    Lack of Transportation (Non-Medical): No  Physical Activity: Sufficiently Active (04/27/2022)   Exercise Vital Sign    Days of Exercise per Week: 3 days    Minutes of Exercise per Session: 90 min  Stress: No Stress Concern Present (04/27/2022)   [employer] of Occupational Health - Occupational Stress Questionnaire    Feeling of Stress : Not at all  Social Connections: Moderately Integrated (04/27/2022)   Social Connection and Isolation Panel [NHANES]    Frequency of Communication with Friends and Family: More than three times a week    Frequency of Social Gatherings with Friends and Family: More than three times a week    Attends Religious Services: More than 4 times per year    Active Member of [employer] or Organizations: Yes    Attends Banker Meetings: More than 4 times per year    Marital Status: Separated  Intimate Partner Violence: Not At Risk (04/27/2022)   Humiliation, Afraid, Rape, and Kick questionnaire    Fear of Current or Ex-Partner: No    Emotionally Abused: No    Physically Abused: No    Sexually Abused: No   Family History  Problem Relation Age of Onset   Cancer Mother        unsure of type   Cancer Father        unsure of type   Colon  cancer Neg Hx    Esophageal cancer Neg Hx    Rectal cancer Neg Hx    Stomach cancer Neg Hx    Breast cancer Neg Hx    Past Surgical History:  Procedure Laterality Date   ABDOMINAL HYSTERECTOMY     due to fibroids at 67, [facility]   COLON SURGERY     COLONOSCOPY     JOINT REPLACEMENT     TKR x 2 on left knee   LIVER SURGERY       Mardella Layman, MD 12/05/22 1141

## 2022-12-09 ENCOUNTER — Ambulatory Visit
Admission: RE | Admit: 2022-12-09 | Discharge: 2022-12-09 | Disposition: A | Discharge status: Home / Self Care | Payer: Medicare HMO | Source: Ambulatory Visit | Attending: Podiatry | Admitting: Podiatry

## 2022-12-09 DIAGNOSIS — S86312A Strain of muscle(s) and tendon(s) of peroneal muscle group at lower leg level, left leg, initial encounter: Secondary | ICD-10-CM

## 2022-12-09 DIAGNOSIS — S86012A Strain of left Achilles tendon, initial encounter: Secondary | ICD-10-CM

## 2022-12-09 DIAGNOSIS — R600 Localized edema: Secondary | ICD-10-CM | POA: Diagnosis not present

## 2022-12-09 DIAGNOSIS — S93692A Other sprain of left foot, initial encounter: Secondary | ICD-10-CM

## 2022-12-09 DIAGNOSIS — G8929 Other chronic pain: Secondary | ICD-10-CM | POA: Diagnosis not present

## 2022-12-09 DIAGNOSIS — M25572 Pain in left ankle and joints of left foot: Secondary | ICD-10-CM | POA: Diagnosis not present

## 2022-12-12 ENCOUNTER — Ambulatory Visit (INDEPENDENT_AMBULATORY_CARE_PROVIDER_SITE_OTHER): Payer: Medicare HMO | Admitting: Family Medicine

## 2022-12-12 VITALS — BP 120/72 | HR 64 | Temp 98.4°F | Ht 69.0 in | Wt 195.0 lb

## 2022-12-12 DIAGNOSIS — J069 Acute upper respiratory infection, unspecified: Secondary | ICD-10-CM | POA: Diagnosis not present

## 2022-12-12 MED ORDER — AMOXICILLIN 875 MG PO TABS: 875.0000 mg | ORAL_TABLET | Freq: Two times a day (BID) | ORAL | 0 refills | Status: AC

## 2022-12-12 NOTE — Progress Notes (Signed)
Subjective:    Patient ID: Kathryn Zavala, female    DOB: [birth year], 67 y.o.   MRN: [id number]  Patient reports a 1 week history of cough, sore throat, postnasal drip, head congestion.  She also reports a headache in her frontal sinus area.  She was seen at an urgent care and was given hycodan 1 week ago.  She has not got the prescription yet.  She has been taking Mucinex. Past Medical History:  Diagnosis Date   Cancer [facility])    2010 Colon with mets to the liver, partial colectomy, chemo, and liver met resection   Cataract    Left leg pain    numbness   Stroke (HCC)    Pt reports that she thinks she had a stroke in her late 78s, early 30s.    Past Surgical History:  Procedure Laterality Date   ABDOMINAL HYSTERECTOMY     due to fibroids at 28, [facility]   COLON SURGERY     COLONOSCOPY     JOINT REPLACEMENT     TKR x 2 on left knee   LIVER SURGERY     Current Outpatient Medications on File Prior to Visit  Medication Sig Dispense Refill   escitalopram (LEXAPRO) 20 MG tablet Take 1 tablet (20 mg total) by mouth daily. 90 tablet 3   HYDROcodone bit-homatropine (HYCODAN) 5-1.5 MG/5ML syrup Take 2.5-5 mLs by mouth every 6 (six) hours as needed for cough. 60 mL 0   meloxicam (MOBIC) 15 MG tablet Take 1 tablet (15 mg total) by mouth daily. 30 tablet 3   methylPREDNISolone (MEDROL DOSEPAK) 4 MG TBPK tablet 6 day dose pack - take as directed 21 tablet 0   zolpidem (AMBIEN) 10 MG tablet Take 1 tablet (10 mg total) by mouth at bedtime as needed for sleep. 15 tablet 1   Current Facility-Administered Medications on File Prior to Visit  Medication Dose Route Frequency Provider Last Rate Last Admin   lidocaine (PF) (XYLOCAINE) 1 % injection 1 mL  1 mL Other Once Donita Brooks, MD       triamcinolone acetonide (KENALOG-40) injection 20 mg  20 mg Intra-articular Once Donita Brooks, MD       Allergies  Allergen Reactions   Duloxetine Diarrhea, Nausea And Vomiting and Other (See  Comments)    red blisters on face and arms   Social History   Socioeconomic History   Marital status: Domestic Partner    Spouse name: Malachi Bonds   Number of children: 5   Years of education: 12   Highest education level: High school graduate  Occupational History   Occupation: Production designer, theatre/television/film  Tobacco Use   Smoking status: Former    Types: Cigarettes   Smokeless tobacco: Never   Tobacco comments:    high school days  Vaping Use   Vaping status: Never Used  Substance and Sexual Activity   Alcohol use: Yes    Comment: beer every other day   Drug use: No   Sexual activity: Not on file  Other Topics Concern   Not on file  Social History Narrative   Lives at home with her domestic partner, Malachi Bonds.   Right-handed.   Two cups coffee per day.   Works at [employer].   11 grandchildren.   Social Determinants of Health   Financial Resource Strain: Low Risk  (04/27/2022)   Overall Financial Resource Strain (CARDIA)    Difficulty of Paying Living Expenses: Not hard at all  Food Insecurity: No Food Insecurity (04/27/2022)   Hunger Vital Sign    Worried About Running Out of Food in the Last Year: Never true    Ran Out of Food in the Last Year: Never true  Transportation Needs: No Transportation Needs (04/27/2022)   PRAPARE - Administrator, Civil Service (Medical): No    Lack of Transportation (Non-Medical): No  Physical Activity: Sufficiently Active (04/27/2022)   Exercise Vital Sign    Days of Exercise per Week: 3 days    Minutes of Exercise per Session: 90 min  Stress: No Stress Concern Present (04/27/2022)   [employer] of Occupational Health - Occupational Stress Questionnaire    Feeling of Stress : Not at all  Social Connections: Moderately Integrated (04/27/2022)   Social Connection and Isolation Panel [NHANES]    Frequency of Communication with Friends and Family: More than three times a week    Frequency of Social Gatherings with Friends and  Family: More than three times a week    Attends Religious Services: More than 4 times per year    Active Member of [employer] or Organizations: Yes    Attends Banker Meetings: More than 4 times per year    Marital Status: Separated  Intimate Partner Violence: Not At Risk (04/27/2022)   Humiliation, Afraid, Rape, and Kick questionnaire    Fear of Current or Ex-Partner: No    Emotionally Abused: No    Physically Abused: No    Sexually Abused: No   Family History  Problem Relation Age of Onset   Cancer Mother        unsure of type   Cancer Father        unsure of type   Colon cancer Neg Hx    Esophageal cancer Neg Hx    Rectal cancer Neg Hx    Stomach cancer Neg Hx    Breast cancer Neg Hx       Review of Systems  All other systems reviewed and are negative.      Objective:   Physical Exam Vitals reviewed.  Constitutional:      General: She is not in acute distress.    Appearance: She is well-developed. She is not ill-appearing, toxic-appearing or diaphoretic.  HENT:     Head: Normocephalic and atraumatic.     Right Ear: Tympanic membrane and ear canal normal.     Left Ear: Tympanic membrane and ear canal normal.     Nose: Congestion and rhinorrhea present.     Mouth/Throat:     Pharynx: No oropharyngeal exudate or posterior oropharyngeal erythema.  Eyes:     Conjunctiva/sclera: Conjunctivae normal.  Neck:     Thyroid: No thyromegaly.     Vascular: No JVD.     Trachea: No tracheal deviation.  Cardiovascular:     Rate and Rhythm: Normal rate and regular rhythm.     Heart sounds: Normal heart sounds. No murmur heard.    No friction rub. No gallop.  Pulmonary:     Effort: Pulmonary effort is normal. No tachypnea, accessory muscle usage, prolonged expiration or respiratory distress.     Breath sounds: Decreased air movement present. No stridor. Decreased breath sounds present. No wheezing or rales.  Chest:     Chest wall: No tenderness.  Lymphadenopathy:      Cervical: No cervical adenopathy.  Neurological:     Mental Status: She is alert.     Motor: No abnormal muscle tone.  Assessment & Plan:  Viral URI Patient has a viral URI.  I recommended symptomatic treatment only.  She can take Mucinex and/or Hycodan for cough.  She can take Coricidin for head congestion and postnasal drip.  If she develops a high fever or worsening pain in her frontal sinuses she may be developing a secondary sinus infection that I will treat with amoxicillin.  However I have asked her to allow 3-4 more days for the symptoms to resolve on their own

## 2023-01-09 ENCOUNTER — Encounter: Payer: Self-pay | Admitting: Podiatry

## 2023-01-09 ENCOUNTER — Ambulatory Visit: Payer: Medicare HMO | Admitting: Podiatry

## 2023-01-09 DIAGNOSIS — S93692D Other sprain of left foot, subsequent encounter: Secondary | ICD-10-CM

## 2023-01-09 NOTE — Progress Notes (Addendum)
 She presents today for follow-up of her MRI and pain to her left foot.  She states that seems to be getting worse even the right foot is starting to hurt now in the same area.  Objective: Vital signs stable and oriented x 3.  Pulses are palpable.  There is no erythema Dem salines drainage odor she has severe pain on palpation medial calcaneal tubercles left greater than right.  MRI does demonstrate Planter fasciitis but no tears.  Assessment: Plantar fasciitis left greater than right.  Plan: Discussed etiology pathology conservative surgical therapies at this point consented her today for endoscopic plantar fasciotomy of the medial band left foot.  Also consented her for a PRP injection to the surgical site on the left foot and the plantar fascial calcaneal insertion site on the right foot. Patient was dispensed a walking boot for postoperative use and instructions for wear.  Pulmonary consent form on byline number number given her [facility] vascular she self-fed regarding this procedure answered best my ability layman's terms she understood and was amenable to it signed out the patient consent form I will follow-up with her in the near future for surgical intervention.

## 2023-01-23 ENCOUNTER — Telehealth: Payer: Self-pay | Admitting: Podiatry

## 2023-01-23 NOTE — Telephone Encounter (Signed)
Pt called and works at night and due to the severity of pain she is having is asking for a note to be out of work. She is scheduled to have surgery 03/02/23. She said she is on fmla with another doctors office currently not thru our office but her work is telling her she needs a note from our office.

## 2023-01-24 ENCOUNTER — Telehealth: Payer: Self-pay | Admitting: Podiatry

## 2023-01-24 NOTE — Telephone Encounter (Signed)
DOS- 03/02/23  EPF [id number] PRP INJECTION BILAT HEELS- 20550  [employer] EFFECTIVE DATE- 06/07/22 OOP-$ 3600.00 WITH REMAINING $2,716.05  COINSURANCE- 0%  PER THE COHERE PORTAL, PRIOR AUTH IS NOT REQUIRED FOR CPT CODES 14782 AND 20550.  AUTH Tracking #[id number]

## 2023-02-01 ENCOUNTER — Encounter: Payer: Self-pay | Admitting: Podiatry

## 2023-02-14 ENCOUNTER — Telehealth: Payer: Self-pay | Admitting: Urology

## 2023-02-14 NOTE — Telephone Encounter (Signed)
 Lm to call back and reschedule sx on 03/02/23  with Dr. Al Corpus.

## 2023-02-24 ENCOUNTER — Encounter (HOSPITAL_COMMUNITY): Payer: Self-pay

## 2023-02-24 ENCOUNTER — Ambulatory Visit (HOSPITAL_COMMUNITY)
Admission: EM | Admit: 2023-02-24 | Discharge: 2023-02-24 | Disposition: A | Discharge status: Home / Self Care | Payer: Medicare HMO | Attending: Emergency Medicine | Admitting: Emergency Medicine

## 2023-02-24 DIAGNOSIS — X58XXXA Exposure to other specified factors, initial encounter: Secondary | ICD-10-CM | POA: Insufficient documentation

## 2023-02-24 DIAGNOSIS — S39012A Strain of muscle, fascia and tendon of lower back, initial encounter: Secondary | ICD-10-CM | POA: Diagnosis not present

## 2023-02-24 DIAGNOSIS — R109 Unspecified abdominal pain: Secondary | ICD-10-CM

## 2023-02-24 LAB — POCT URINALYSIS DIP (MANUAL ENTRY)
Bilirubin, UA: NEGATIVE
Glucose, UA: NEGATIVE mg/dL
Ketones, POC UA: NEGATIVE mg/dL
Leukocytes, UA: NEGATIVE
Nitrite, UA: NEGATIVE
Protein Ur, POC: NEGATIVE mg/dL
Spec Grav, UA: >=1.03 — AB (ref 1.010–1.025)
Urobilinogen, UA: 0.2 U/dL
pH, UA: 5 (ref 5.0–8.0)

## 2023-02-24 MED ORDER — METHOCARBAMOL 500 MG PO TABS: 500.0000 mg | ORAL_TABLET | Freq: Two times a day (BID) | ORAL | 0 refills | Status: AC

## 2023-02-24 MED ORDER — DEXAMETHASONE SODIUM PHOSPHATE 10 MG/ML IJ SOLN
INTRAMUSCULAR | Status: AC
  Filled 2023-02-24: qty 1

## 2023-02-24 MED ORDER — DEXAMETHASONE SODIUM PHOSPHATE 10 MG/ML IJ SOLN
10.0000 mg | Freq: Once | INTRAMUSCULAR | Status: AC
  Administered 2023-02-24: 10 mg via INTRAMUSCULAR

## 2023-02-24 NOTE — Discharge Instructions (Addendum)
You can take Robaxin twice daily as needed for muscle pain and spasms.  It can make you drowsy so do not drive or work while taking.  Otherwise she can alternate between Tylenol ibuprofen as needed.  I also recommend alternating between heat and ice as needed.  Have attached an orthopedic doctor he can follow-up with if your pain persist.  I have sent a urine culture just to rule out underlying infection.  Someone will call you if results are positive and adjust treatment plan.  Return here as needed.

## 2023-02-24 NOTE — ED Triage Notes (Signed)
Patient here today with c/o left side LB pain X 1 week. She has been taking Advil with no relief.

## 2023-02-24 NOTE — ED Provider Notes (Signed)
[facility]    CSN: [id number] Arrival date & time: 02/24/23  1001      History   Chief Complaint Chief Complaint  Patient presents with   Back Pain    HPI Kathryn Zavala is a 68 y.o. female.   Patient presents with left-sided low back pain x 1 week.  Patient denies any injuries or falls.  Denies abdominal pain, dysuria, hematuria, and fever.  Patient states she has been taking Advil with little relief.   Back Pain Associated symptoms: no abdominal pain, no dysuria, no fever and no pelvic pain     Past Medical History:  Diagnosis Date   Cancer (HCC)    2010 Colon with mets to the liver, partial colectomy, chemo, and liver met resection   Cataract    Left leg pain    numbness   Stroke (HCC)    Pt reports that she thinks she had a stroke in her late 32s, early 30s.    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      Home Medications    Prior to Admission medications   Medication Sig Start Date End Date Taking? Authorizing Provider  methocarbamol (ROBAXIN) 500 MG tablet Take 1 tablet (500 mg total) by mouth 2 (two) times daily. 02/24/23  Yes Susann Givens, Sherline Eberwein A, NP  escitalopram (LEXAPRO) 20 MG tablet Take 1 tablet (20 mg total) by mouth daily. 05/18/22   Donita Brooks, MD  meloxicam (MOBIC) 15 MG tablet Take 1 tablet (15 mg total) by mouth daily. 10/17/22   Hyatt, Max T, DPM   zolpidem (AMBIEN) 10 MG tablet Take 1 tablet (10 mg total) by mouth at bedtime as needed for sleep. 10/03/22 11/02/22  Donita Brooks, MD    Family History Family History  Problem Relation Age of Onset   Cancer Mother        unsure of type   Cancer Father        unsure of type   Colon cancer Neg Hx    Esophageal cancer Neg Hx    Rectal cancer Neg Hx    Stomach cancer Neg Hx    Breast cancer Neg Hx     Social History Social History   Tobacco Use   Smoking status: Former    Types: Cigarettes   Smokeless tobacco: Never   Tobacco comments:    high school days  Vaping Use   Vaping status: Never Used  Substance Use Topics   Alcohol use: Yes    Comment: beer every other day   Drug use: No     Allergies   Duloxetine   Review of Systems Review of Systems  Constitutional:  Negative for chills, fatigue and fever.  Gastrointestinal:  Negative for abdominal pain,  diarrhea, nausea and vomiting.  Genitourinary:  Positive for flank pain. Negative for dysuria, frequency, hematuria and pelvic pain.  Musculoskeletal:  Positive for back pain.     Physical Exam Triage Vital Signs ED Triage Vitals  Encounter Vitals Group     BP 02/24/23 1021 128/84     Systolic BP Percentile --      Diastolic BP Percentile --      Pulse Rate 02/24/23 1021 63     Resp 02/24/23 1021 16     Temp 02/24/23 1021 (!) 97.5 F (36.4 C)     Temp Source 02/24/23 1021 Oral     SpO2 02/24/23 1021 96 %     Weight 02/24/23 1022 197 lb (89.4 kg)     Height 02/24/23 1022 5\' 9"  (1.753 m)     Head Circumference --      Peak Flow --      Pain Score 02/24/23 1022 10     Pain Loc --      Pain Education --      Exclude from Growth Chart --    No data found.  Updated Vital Signs BP 128/84 (BP Location: Left Arm)   Pulse 63   Temp (!) 97.5 F (36.4 C) (Oral)   Resp 16   Ht 5\' 9"  (1.753 m)   Wt 197 lb (89.4 kg)   SpO2 96%   BMI 29.09 kg/m   Visual Acuity Right Eye Distance:   Left Eye  Distance:   Bilateral Distance:    Right Eye Near:   Left Eye Near:    Bilateral Near:     Physical Exam Vitals and nursing note reviewed.  Constitutional:      General: She is awake. She is not in acute distress.    Appearance: Normal appearance. She is well-developed and well-groomed. She is not ill-appearing.  Abdominal:     General: Abdomen is flat.     Palpations: Abdomen is soft.     Tenderness: There is no abdominal tenderness. There is left CVA tenderness. There is no right CVA tenderness.  Musculoskeletal:     Cervical back: Normal.     Thoracic back: Normal.     Lumbar back: Tenderness present.     Comments: Moderate tenderness noted to left low back.  Skin:    General: Skin is warm and dry.  Neurological:     Mental Status: She is alert.  Psychiatric:        Behavior: Behavior is cooperative.      UC Treatments / Results  Labs (all labs ordered are listed, but only abnormal results are displayed) Labs Reviewed  POCT URINALYSIS DIP (MANUAL ENTRY) - Abnormal; Notable for the following components:      Result Value   Spec Grav, UA >=1.030 (*)    Blood, UA trace-lysed (*)    All other components within normal limits  URINE CULTURE    EKG   Radiology No results found.  Procedures Procedures (including critical care time)  Medications Ordered in UC Medications  dexamethasone (DECADRON) injection 10 mg (has no administration in time range)    Initial Impression / Assessment and Plan / UC Course  I have reviewed the triage vital signs and the nursing notes.  Pertinent labs & imaging results that were available during my care of the patient were reviewed by me and considered in my medical decision making (see chart for details).     Patient presented with left-sided low back pain x  1 week.  Denies any injuries or falls.  Denies any urinary symptoms.  Upon assessment patient has mild left CVA tenderness and moderate tenderness noted to left low back.   UA ordered to rule out infection.  UA did not reveal anything significant, will send culture to ensure no infection.  Given Decadron injection in clinic to help with inflammation causing pain.  Prescribed Robaxin as needed for pain.  Discussed follow-up and return precautions. Final Clinical Impressions(s) / UC Diagnoses   Final diagnoses:  Strain of lumbar region, initial encounter     Discharge Instructions      You can take Robaxin twice daily as needed for muscle pain and spasms.  It can make you drowsy so do not drive or work while taking.  Otherwise she can alternate between Tylenol ibuprofen as needed.  I also recommend alternating between heat and ice as needed.  Have attached an orthopedic doctor he can follow-up with if your pain persist.  I have sent a urine culture just to rule out underlying infection.  Someone will call you if results are positive and adjust treatment plan.  Return here as needed.     ED Prescriptions     Medication Sig Dispense Auth. Provider   methocarbamol (ROBAXIN) 500 MG tablet Take 1 tablet (500 mg total) by mouth 2 (two) times daily. 20 tablet Wynonia Lawman A, NP      PDMP not reviewed this encounter.   Wynonia Lawman A, NP 02/24/23 1109

## 2023-02-26 LAB — URINE CULTURE: Culture: <10000 — AB

## 2023-02-27 ENCOUNTER — Ambulatory Visit (INDEPENDENT_AMBULATORY_CARE_PROVIDER_SITE_OTHER): Payer: Medicare HMO | Admitting: Family Medicine

## 2023-02-27 ENCOUNTER — Telehealth: Payer: Self-pay

## 2023-02-27 ENCOUNTER — Encounter: Payer: Self-pay | Admitting: Family Medicine

## 2023-02-27 ENCOUNTER — Other Ambulatory Visit: Payer: Self-pay | Admitting: Family Medicine

## 2023-02-27 VITALS — BP 120/72 | HR 62 | Temp 97.8°F | Ht 69.0 in | Wt 205.6 lb

## 2023-02-27 DIAGNOSIS — R1032 Left lower quadrant pain: Secondary | ICD-10-CM

## 2023-02-27 DIAGNOSIS — R109 Unspecified abdominal pain: Secondary | ICD-10-CM

## 2023-02-27 LAB — URINALYSIS, ROUTINE W REFLEX MICROSCOPIC
Bilirubin Urine: NEGATIVE
Glucose, UA: NEGATIVE
Hyaline Cast: NONE SEEN /[LPF]
Ketones, ur: NEGATIVE
Leukocytes,Ua: NEGATIVE
Nitrite: NEGATIVE
Protein, ur: NEGATIVE
Specific Gravity, Urine: 1.025 (ref 1.001–1.035)
WBC, UA: NONE SEEN /[HPF] (ref 0–5)
pH: 5.5 (ref 5.0–8.0)

## 2023-02-27 LAB — MICROSCOPIC MESSAGE

## 2023-02-27 MED ORDER — OXYCODONE-ACETAMINOPHEN 5-325 MG PO TABS: 1.0000 | ORAL_TABLET | ORAL | 0 refills | Status: DC | PRN

## 2023-02-27 MED ORDER — TAMSULOSIN HCL 0.4 MG PO CAPS: 0.4000 mg | ORAL_CAPSULE | Freq: Every day | ORAL | 0 refills | Status: AC

## 2023-02-27 MED ORDER — OXYCODONE-ACETAMINOPHEN 5-325 MG PO TABS
1.0000 | ORAL_TABLET | ORAL | 0 refills | Status: AC | PRN
Start: 2023-02-27 — End: 2023-03-29

## 2023-02-27 NOTE — Progress Notes (Signed)
Subjective:    Patient ID: Kathryn Zavala, female    DOB: 04/29/1955, 68 y.o.   MRN: [id number]  Patient reports constant pain in her left flank.  The pain does not radiate.  She has left-sided CVA tenderness.  There is no tenderness to palpation over the lumbar or thoracic spinous processes.  There is no tenderness to palpation over the lumbar paraspinal muscles.  The patient is able to bend over easily and touch her toes without any pain in her back.  She is able to twist side-to-side without any pain in her back.  She denies any pain with movement.  Instead the pain is constant.  Her range of motion is normal.  There was a trace amount of blood on her previous urinalysis.  She denies any dysuria or urgency or frequency or fever.  She denies any nausea or vomiting.  She denies any abdominal pain Past Medical History:  Diagnosis Date   Cancer [facility])    2010 Colon with mets to the liver, partial colectomy, chemo, and liver met resection   Cataract    Left leg pain    numbness   Stroke (HCC)    Pt reports that she thinks she had a stroke in her late 106s, early 30s.    Past Surgical History:  Procedure Laterality Date   ABDOMINAL HYSTERECTOMY     due to fibroids at 46, [facility] BSO   COLON SURGERY     COLONOSCOPY     JOINT REPLACEMENT     TKR x 2 on left knee   LIVER SURGERY     Current Outpatient Medications on File Prior to Visit  Medication Sig Dispense Refill   escitalopram (LEXAPRO) 20 MG tablet Take 1 tablet (20 mg total) by mouth daily. 90 tablet 3   meloxicam (MOBIC) 15 MG tablet Take 1 tablet (15 mg total) by mouth daily. 30 tablet 3   methocarbamol (ROBAXIN) 500 MG tablet Take 1 tablet (500 mg total) by mouth 2 (two) times daily. 20 tablet 0   zolpidem (AMBIEN) 10 MG tablet Take 1 tablet (10 mg total) by mouth at bedtime as needed for sleep. 15 tablet 1   No current facility-administered medications on file prior to visit.   Allergies  Allergen Reactions   Duloxetine  Diarrhea, Nausea And Vomiting and Other (See Comments)    red blisters on face and arms   Social History   Socioeconomic History   Marital status: Domestic Partner    Spouse name: Malachi Bonds   Number of children: 5   Years of education: 12   Highest education level: Some college, no degree  Occupational History   Occupation: Production designer, theatre/television/film  Tobacco Use   Smoking status: Former    Types: Cigarettes   Smokeless tobacco: Never   Tobacco comments:    high school days  Vaping Use   Vaping status: Never Used  Substance and Sexual Activity   Alcohol use: Yes    Comment: beer every other day   Drug use: No   Sexual activity: Not on file  Other Topics Concern   Not on file  Social History Narrative   Lives at home with her domestic partner, Malachi Bonds.   Right-handed.   Two cups coffee per day.   Works at [employer].   11 grandchildren.   Social Drivers of Health   Financial Resource Strain: Low Risk  (02/26/2023)   Overall Financial Resource Strain (CARDIA)    Difficulty of Paying  Living Expenses: Not very hard  Food Insecurity: No Food Insecurity (02/26/2023)   Hunger Vital Sign    Worried About Running Out of Food in the Last Year: Never true    Ran Out of Food in the Last Year: Never true  Transportation Needs: No Transportation Needs (02/26/2023)   PRAPARE - Administrator, Civil Service (Medical): No    Lack of Transportation (Non-Medical): No  Physical Activity: Sufficiently Active (02/26/2023)   Exercise Vital Sign    Days of Exercise per Week: 5 days    Minutes of Exercise per Session: 30 min  Stress: Stress Concern Present (02/26/2023)   [employer] of Occupational Health - Occupational Stress Questionnaire    Feeling of Stress : To some extent  Social Connections: Socially Integrated (02/26/2023)   Social Connection and Isolation Panel [NHANES]    Frequency of Communication with Friends and Family: More than three times a week     Frequency of Social Gatherings with Friends and Family: Twice a week    Attends Religious Services: More than 4 times per year    Active Member of [employer] or Organizations: No    Attends Engineer, structural: More than 4 times per year    Marital Status: Married  Catering manager Violence: Not At Risk (04/27/2022)   Humiliation, Afraid, Rape, and Kick questionnaire    Fear of Current or Ex-Partner: No    Emotionally Abused: No    Physically Abused: No    Sexually Abused: No   Family History  Problem Relation Age of Onset   Cancer Mother        unsure of type   Cancer Father        unsure of type   Colon cancer Neg Hx    Esophageal cancer Neg Hx    Rectal cancer Neg Hx    Stomach cancer Neg Hx    Breast cancer Neg Hx       Review of Systems  All other systems reviewed and are negative.      Objective:   Physical Exam Vitals reviewed.  Constitutional:      General: She is not in acute distress.    Appearance: She is well-developed. She is not ill-appearing, toxic-appearing or diaphoretic.  HENT:     Head: Normocephalic and atraumatic.  Neck:     Thyroid: No thyromegaly.     Vascular: No JVD.     Trachea: No tracheal deviation.  Cardiovascular:     Rate and Rhythm: Normal rate and regular rhythm.     Heart sounds: Normal heart sounds. No murmur heard.    No friction rub. No gallop.  Pulmonary:     Effort: Pulmonary effort is normal. No tachypnea, accessory muscle usage, prolonged expiration or respiratory distress.     Breath sounds: Decreased air movement present. No stridor. Decreased breath sounds present. No wheezing or rales.  Chest:     Chest wall: No tenderness.  Abdominal:     General: Bowel sounds are normal. There is no distension.     Palpations: Abdomen is soft.     Tenderness: There is no abdominal tenderness. There is no guarding or rebound.  Musculoskeletal:       Arms:  Neurological:     Mental Status: She is alert.     Motor: No  abnormal muscle tone.           Assessment & Plan:  Flank pain - Plan: Urinalysis, Routine w reflex  microscopic  Left lower quadrant abdominal pain - Plan: CT RENAL STONE STUDY Patient's pain is constant and does not seem to be triggered or exacerbated by movement.  She has completely normal range of motion in her lower back.  Therefore I do not feel that this is a back issue.  I suspect that the patient has a kidney stone.  Recommended a CT scan to evaluate for kidney stone as soon as possible.  Start the patient on Flomax 0.4 mg daily to facilitate stone passage.  Recommended she push fluids.  Use Percocet 5/325 1-2 p.o. every 4 hours as needed pain.  Go to the emergency room if she develops fever or unrelenting pain at least until we have the CT scan results

## 2023-02-27 NOTE — Telephone Encounter (Signed)
Call from Pharmacist at [facility] asking if pain medication can be changed to 1 every 4 hours because they are unable to dispense it as written because it is an initial fill. Thanks.

## 2023-03-02 ENCOUNTER — Ambulatory Visit
Admission: RE | Admit: 2023-03-02 | Discharge: 2023-03-02 | Disposition: A | Discharge status: Home / Self Care | Payer: Medicare HMO | Source: Ambulatory Visit | Attending: Family Medicine | Admitting: Family Medicine

## 2023-03-02 DIAGNOSIS — R109 Unspecified abdominal pain: Secondary | ICD-10-CM | POA: Diagnosis not present

## 2023-03-02 DIAGNOSIS — R1032 Left lower quadrant pain: Secondary | ICD-10-CM

## 2023-03-05 ENCOUNTER — Other Ambulatory Visit: Payer: Self-pay

## 2023-03-05 DIAGNOSIS — R3129 Other microscopic hematuria: Secondary | ICD-10-CM

## 2023-03-08 ENCOUNTER — Encounter: Payer: Medicare HMO | Admitting: Podiatry

## 2023-03-14 ENCOUNTER — Other Ambulatory Visit: Payer: Self-pay | Admitting: Podiatry

## 2023-03-14 MED ORDER — CEPHALEXIN 500 MG PO CAPS: 500.0000 mg | ORAL_CAPSULE | Freq: Three times a day (TID) | ORAL | 0 refills | Status: DC

## 2023-03-14 MED ORDER — OXYCODONE-ACETAMINOPHEN 10-325 MG PO TABS: 1.0000 | ORAL_TABLET | Freq: Three times a day (TID) | ORAL | 0 refills | Status: AC | PRN

## 2023-03-14 MED ORDER — ONDANSETRON HCL 4 MG PO TABS: 4.0000 mg | ORAL_TABLET | Freq: Three times a day (TID) | ORAL | 0 refills | Status: DC | PRN

## 2023-03-16 DIAGNOSIS — M722 Plantar fascial fibromatosis: Secondary | ICD-10-CM | POA: Diagnosis not present

## 2023-03-16 DIAGNOSIS — G8918 Other acute postprocedural pain: Secondary | ICD-10-CM | POA: Diagnosis not present

## 2023-03-22 ENCOUNTER — Encounter: Payer: Medicare HMO | Admitting: Podiatry

## 2023-03-22 ENCOUNTER — Encounter: Payer: Self-pay | Admitting: Podiatry

## 2023-03-22 ENCOUNTER — Ambulatory Visit (INDEPENDENT_AMBULATORY_CARE_PROVIDER_SITE_OTHER): Payer: Medicare HMO | Admitting: Podiatry

## 2023-03-22 ENCOUNTER — Ambulatory Visit (INDEPENDENT_AMBULATORY_CARE_PROVIDER_SITE_OTHER): Payer: Medicare HMO

## 2023-03-22 VITALS — Ht 69.0 in | Wt 205.6 lb

## 2023-03-22 DIAGNOSIS — S93692D Other sprain of left foot, subsequent encounter: Secondary | ICD-10-CM

## 2023-03-22 DIAGNOSIS — Z9889 Other specified postprocedural states: Secondary | ICD-10-CM

## 2023-03-22 NOTE — Progress Notes (Signed)
  Subjective:  Patient ID: Kathryn Zavala, female    DOB: [birth year],  MRN: [id number]  Chief Complaint  Patient presents with   Routine Post Op    Pt is here for 1st post op visit since surgery on 2/7 pt states her foot is feeling fine, pain has been controlled just recently started using pain meds due to pain, but other than that she has no complaints.    DOS: 03/16/2023 Procedure: Left foot endoscopic plantar fasciotomy and PRP injection  68 y.o. female seen for post op check.  Patient reports she is doing well she has minimal pain.  She just recently started taking her pain medications as the postoperative nerve block was doing very well and she was not having any pain.  Has remained nonweightbearing to the left foot with aid of a knee scooter and crutches.  Has a cam boot.  Has kept dressing clean dry and intact as instructed.  Review of Systems: Negative except as noted in the HPI. Denies N/V/F/Ch.   Objective:  There were no vitals filed for this visit. Body mass index is 30.36 kg/m. Constitutional Well developed. Well nourished.  Vascular Foot warm and well perfused. Capillary refill normal to all digits.   No calf pain with palpation  Neurologic Normal speech. Oriented to person, place, and time. Epicritic sensation intact to the toes.  Dermatologic Portal sites without drainage erythema or dehiscence  Orthopedic: Mild tenderness to palpation at the portal sites and plantar heel   Radiographs: 03/22/2023 no acute osseous abnormality identified  Pathology: N/A  Micro: N/A  Assessment:   1 week status post EPF left foot  Plan:  Patient was evaluated and treated and all questions answered.  POD # 6 s/p left foot EPF -Progressing as would be expected doing well with minimal pain 1 week from surgery -XR: No acute osseous complication noted -WB Status: Nonweightbearing in CAM Walker boot -Sutures: To remain intact until next appointment. -Medications/ABX: Finish  antibiotics as previously instructed ibuprofen best for pain and narcotics as needed for breakthrough pain -Foot redressed.  Provided patient supplies she can change the dressing in 1 week and then leave that 1 on until she comes back in another week - Return in 2 weeks for suture removal        Corinna Gab, DPM [facility]

## 2023-04-05 ENCOUNTER — Encounter: Payer: Self-pay | Admitting: Podiatry

## 2023-04-05 ENCOUNTER — Ambulatory Visit (INDEPENDENT_AMBULATORY_CARE_PROVIDER_SITE_OTHER): Payer: Medicare HMO | Admitting: Podiatry

## 2023-04-05 DIAGNOSIS — S93692D Other sprain of left foot, subsequent encounter: Secondary | ICD-10-CM

## 2023-04-05 DIAGNOSIS — Z9889 Other specified postprocedural states: Secondary | ICD-10-CM

## 2023-04-05 NOTE — Progress Notes (Signed)
 He presents today for second postop visit endoscopic plantar fasciotomy medial band release with PRP injection to both heels.  She states is just a little pain he has been feeling better this week.  She does present today with in her cam boot.  Objective: Sutures were removed today margins remain well coapted mildly tender on palpation.  Assessment: Pain in limb just at the site of surgery.  Plan: Remove sutures today put her back in her tennis shoes she will wear her cam boot at night we will continue out of work for at least another 2 weeks we will follow-up with her at that time.

## 2023-04-12 ENCOUNTER — Encounter: Payer: Medicare HMO | Admitting: Podiatry

## 2023-04-26 ENCOUNTER — Encounter: Payer: Self-pay | Admitting: Podiatry

## 2023-04-26 ENCOUNTER — Ambulatory Visit (INDEPENDENT_AMBULATORY_CARE_PROVIDER_SITE_OTHER): Payer: Medicare HMO | Admitting: Podiatry

## 2023-04-26 DIAGNOSIS — Z9889 Other specified postprocedural states: Secondary | ICD-10-CM

## 2023-04-26 DIAGNOSIS — S93692D Other sprain of left foot, subsequent encounter: Secondary | ICD-10-CM

## 2023-04-26 NOTE — Progress Notes (Signed)
 She presents today for follow-up of her endoscopic fasciotomy of the medial band.  She states that is doing very well she is relates her pain level as anywhere from 3-5 out of 10.  States that it still hurts to walk long distances or be on her feet from hours at a time.  Objective: Vitals are stable alert oriented x 3 there is no erythema edema cellulitis drainage or odor.  She has gone on to heal uneventfully has very little pain at the surgical site.  I am unable to palpate the medial band of the plantar fascia.  Assessment: Well-healing endoscopic plantar fasciotomy.  Plan: Recommend that she follow-up with me in 2 weeks at which time we will assess to as to whether or not she needs to go back to work or to physical therapy that time.

## 2023-05-08 ENCOUNTER — Encounter: Payer: Self-pay | Admitting: Orthopaedic Surgery

## 2023-05-29 ENCOUNTER — Ambulatory Visit (INDEPENDENT_AMBULATORY_CARE_PROVIDER_SITE_OTHER): Admitting: Podiatry

## 2023-05-29 ENCOUNTER — Encounter: Payer: Self-pay | Admitting: Podiatry

## 2023-05-29 DIAGNOSIS — Z9889 Other specified postprocedural states: Secondary | ICD-10-CM

## 2023-05-29 DIAGNOSIS — S93692D Other sprain of left foot, subsequent encounter: Secondary | ICD-10-CM

## 2023-05-30 NOTE — Progress Notes (Signed)
 She presents today for follow-up of her endoscopic plantar fasciotomy she states that she is approximately 75% improved.  She states that she is not able to work yet standing on concrete floors.  She has been taking Tylenol  for the pain.  Surgery was performed March 16, 2023.  Objective: Vital signs are stable alert oriented x 3.  Pulses are palpable.  There is no erythema edema cellulitis drainage or odor.  She does have considerable discomfort on palpation of the surgical site and distally along the medial longitudinal arch.  The medial band of the plantar fascia is no longer palpable.  Assessment: Well-healing surgical foot 75% improved.  Plan: Will follow-up with her in 1 month continue physical therapy at home if she has not improved in 1 month formal physical therapy will be necessary.  She will continue out of work

## 2023-06-19 ENCOUNTER — Encounter: Payer: Self-pay | Admitting: Podiatry

## 2023-06-19 ENCOUNTER — Ambulatory Visit (INDEPENDENT_AMBULATORY_CARE_PROVIDER_SITE_OTHER): Admitting: Podiatry

## 2023-06-19 DIAGNOSIS — G629 Polyneuropathy, unspecified: Secondary | ICD-10-CM

## 2023-06-19 DIAGNOSIS — S93692A Other sprain of left foot, initial encounter: Secondary | ICD-10-CM

## 2023-06-19 MED ORDER — GABAPENTIN 300 MG PO CAPS: 300.0000 mg | ORAL_CAPSULE | Freq: Two times a day (BID) | ORAL | 3 refills | Status: AC

## 2023-06-19 NOTE — Progress Notes (Signed)
 She presents today postop visit date of surgery is 03/16/2023 endoscopic fasciotomy medial band.  She states that she is experiencing some burning sensation by the end of the day in both feet not just the surgical foot.  She has ranked her progress by 75% last visit.  At this point she is 80+ percent improved.  Objective: Vital signs are stable alert oriented x 3.  She has pain on palpation of the surgical site at the diastases of the plantar fascia.  This is much improved it is not hard it is much more supple than previously noted.  Assessment: Resolving plantar fasciitis with neuropathy.  Plan: I will start her on 300 mg of gabapentin  she will take 1 tablet twice daily follow-up with me in 1 month I offered her physical therapy again she declined due to her work hours.

## 2023-07-19 ENCOUNTER — Ambulatory Visit: Admitting: Podiatry

## 2023-12-04 DIAGNOSIS — Z6830 Body mass index (BMI) 30.0-30.9, adult: Secondary | ICD-10-CM | POA: Diagnosis not present

## 2023-12-04 DIAGNOSIS — M2020 Hallux rigidus, unspecified foot: Secondary | ICD-10-CM | POA: Diagnosis not present

## 2023-12-04 DIAGNOSIS — H269 Unspecified cataract: Secondary | ICD-10-CM | POA: Diagnosis not present

## 2023-12-04 DIAGNOSIS — M1990 Unspecified osteoarthritis, unspecified site: Secondary | ICD-10-CM | POA: Diagnosis not present

## 2023-12-04 DIAGNOSIS — Z809 Family history of malignant neoplasm, unspecified: Secondary | ICD-10-CM | POA: Diagnosis not present

## 2023-12-04 DIAGNOSIS — E669 Obesity, unspecified: Secondary | ICD-10-CM | POA: Diagnosis not present

## 2023-12-04 DIAGNOSIS — Z8249 Family history of ischemic heart disease and other diseases of the circulatory system: Secondary | ICD-10-CM | POA: Diagnosis not present

## 2023-12-04 DIAGNOSIS — M2010 Hallux valgus (acquired), unspecified foot: Secondary | ICD-10-CM | POA: Diagnosis not present

## 2023-12-04 DIAGNOSIS — Z85038 Personal history of other malignant neoplasm of large intestine: Secondary | ICD-10-CM | POA: Diagnosis not present

## 2023-12-20 ENCOUNTER — Ambulatory Visit

## 2024-01-04 ENCOUNTER — Encounter: Payer: Self-pay | Admitting: Family Medicine

## 2024-02-04 ENCOUNTER — Ambulatory Visit: Admitting: Family Medicine

## 2024-02-04 ENCOUNTER — Encounter: Payer: Self-pay | Admitting: Family Medicine

## 2024-02-04 VITALS — BP 122/74 | HR 80 | Temp 98.4°F | Ht 69.0 in | Wt 203.0 lb

## 2024-02-04 DIAGNOSIS — Z23 Encounter for immunization: Secondary | ICD-10-CM

## 2024-02-04 DIAGNOSIS — Z1211 Encounter for screening for malignant neoplasm of colon: Secondary | ICD-10-CM | POA: Diagnosis not present

## 2024-02-04 DIAGNOSIS — Z13220 Encounter for screening for lipoid disorders: Secondary | ICD-10-CM | POA: Diagnosis not present

## 2024-02-04 DIAGNOSIS — Z1231 Encounter for screening mammogram for malignant neoplasm of breast: Secondary | ICD-10-CM

## 2024-02-04 DIAGNOSIS — Z0000 Encounter for general adult medical examination without abnormal findings: Secondary | ICD-10-CM

## 2024-02-04 DIAGNOSIS — Z0001 Encounter for general adult medical examination with abnormal findings: Secondary | ICD-10-CM | POA: Diagnosis not present

## 2024-02-04 DIAGNOSIS — Z780 Asymptomatic menopausal state: Secondary | ICD-10-CM | POA: Diagnosis not present

## 2024-02-04 NOTE — Addendum Note (Signed)
 Addended by: ANGELENA RONAL BRADLEY K on: 02/04/2024 12:16 PM   Modules accepted: Orders

## 2024-02-04 NOTE — Progress Notes (Signed)
 "  Subjective:    Patient ID: Kathryn Zavala , female    DOB: [birth year], 68 y.o.   MRN: [id number]  HPI  Patient has a very complicated past medical history.  Patient was diagnosed with colon cancer in 2010.  She underwent a hemicolectomy per her report.  There was also metastasis to the liver.  Again per her report the individual metastasis/masses were removed surgically and then she was treated with chemotherapy.  She was reportedly told that she was cancer free after chemotherapy starting in 2013.  She was followed up annually by her oncologist but had not seen him since 2018.  She also had a total abdominal hysterectomy/bilateral salpingo-oophorectomy when she was 68 years old due to fibroids.  She states that both of her ovaries were removed at that time.  She is here today for complete physical exam.  She is due for a flu shot.  She states that she has had a shingles vaccine in the past.  Her pneumonia shot is up-to-date. Immunization History  Administered Date(s) Administered   Fluad Quad(high Dose 65+) 11/19/2018   Influenza,inj,Quad PF,6+ Mos 10/30/2017   PFIZER(Purple Top)SARS-COV-2 Vaccination 04/19/2019, 05/10/2019, 11/13/2019   PNEUMOCOCCAL CONJUGATE-20 05/03/2022   She agrees to receive the flu shot.  She has not had a mammogram in the last 12 months.  She has not had a bone density in more than 5 years.  She had a colonoscopy in 2021.  They removed 2 polyps and recommended a repeat colonoscopy in 1 year.  The patient did not follow back up with GI.  She is overdue for a colonoscopy.  Blood pressure today is outstanding Past Medical History:  Diagnosis Date   Cancer (HCC)    2010 Colon with mets to the liver, partial colectomy, chemo, and liver met resection   Cataract    Left leg pain    numbness   Stroke (HCC)    Pt reports that she thinks she had a stroke in her late 63s, early 30s.    Past Surgical History:  Procedure Laterality Date   ABDOMINAL HYSTERECTOMY      due to fibroids at 42, [facility]   COLON SURGERY     COLONOSCOPY     JOINT REPLACEMENT     TKR x 2 on left knee   LIVER SURGERY     Current Outpatient Medications on File Prior to Visit  Medication Sig Dispense Refill   escitalopram  (LEXAPRO ) 20 MG tablet Take 1 tablet (20 mg total) by mouth daily. (Patient not taking: Reported on 06/19/2023) 90 tablet 3   gabapentin  (NEURONTIN ) 300 MG capsule Take 1 capsule (300 mg total) by mouth 2 (two) times daily. 180 capsule 3   meloxicam  (MOBIC ) 15 MG tablet Take 1 tablet (15 mg total) by mouth daily. (Patient not taking: Reported on 06/19/2023) 30 tablet 3   methocarbamol  (ROBAXIN ) 500 MG tablet Take 1 tablet (500 mg total) by mouth 2 (two) times daily. (Patient not taking: Reported on 06/19/2023) 20 tablet 0   tamsulosin  (FLOMAX ) 0.4 MG CAPS capsule Take 1 capsule (0.4 mg total) by mouth daily. (Patient not taking: Reported on 06/19/2023) 30 capsule 0   zolpidem  (AMBIEN ) 10 MG tablet Take 1 tablet (10 mg total) by mouth at bedtime as needed for sleep. 15 tablet 1   No current facility-administered medications on file prior to visit.   Allergies  Allergen Reactions   Duloxetine  Diarrhea, Nausea And Vomiting and Other (See Comments)    red blisters  on face and arms   Social History   Socioeconomic History   Marital status: Media Planner    Spouse name: Meade   Number of children: 5   Years of education: 12   Highest education level: Some college, no degree  Occupational History   Occupation: production designer, theatre/television/film  Tobacco Use   Smoking status: Former    Types: Cigarettes   Smokeless tobacco: Never   Tobacco comments:    high school days  Vaping Use   Vaping status: Never Used  Substance and Sexual Activity   Alcohol use: Yes    Comment: beer every other day   Drug use: No   Sexual activity: Not on file  Other Topics Concern   Not on file  Social History Narrative   Lives at home with her domestic partner, Meade.   Right-handed.    Two cups coffee per day.   Works at [employer].   11 grandchildren.   Social Drivers of Health   Tobacco Use: Medium Risk (06/19/2023)   Patient History    Smoking Tobacco Use: Former    Smokeless Tobacco Use: Never    Passive Exposure: Not on file  Financial Resource Strain: Low Risk (02/26/2023)   Overall Financial Resource Strain (CARDIA)    Difficulty of Paying Living Expenses: Not very hard  Food Insecurity: No Food Insecurity (02/26/2023)   Hunger Vital Sign    Worried About Running Out of Food in the Last Year: Never true    Ran Out of Food in the Last Year: Never true  Transportation Needs: No Transportation Needs (02/26/2023)   PRAPARE - Administrator, Civil Service (Medical): No    Lack of Transportation (Non-Medical): No  Physical Activity: Sufficiently Active (02/26/2023)   Exercise Vital Sign    Days of Exercise per Week: 5 days    Minutes of Exercise per Session: 30 min  Stress: Stress Concern Present (02/26/2023)   [employer] of Occupational Health - Occupational Stress Questionnaire    Feeling of Stress : To some extent  Social Connections: Moderately Integrated (02/26/2023)   Social Connection and Isolation Panel    Frequency of Communication with Friends and Family: More than three times a week    Frequency of Social Gatherings with Friends and Family: Twice a week    Attends Religious Services: More than 4 times per year    Active Member of [employer] or Organizations: No    Attends Banker Meetings: Not on file    Marital Status: Married  Catering Manager Violence: Not At Risk (04/27/2022)   Humiliation, Afraid, Rape, and Kick questionnaire    Fear of Current or Ex-Partner: No    Emotionally Abused: No    Physically Abused: No    Sexually Abused: No  Depression (PHQ2-9): Low Risk (02/27/2023)   Depression (PHQ2-9)    PHQ-2 Score: 0  Alcohol Screen: Low Risk (02/26/2023)   Alcohol Screen    Last Alcohol Screening Score  (AUDIT): 3  Housing: Low Risk (02/26/2023)   Housing Stability Vital Sign    Unable to Pay for Housing in the Last Year: No    Number of Times Moved in the Last Year: 0    Homeless in the Last Year: No  Utilities: Not At Risk (04/27/2022)   AHC Utilities    Threatened with loss of utilities: No  Health Literacy: Not on file   Family History  Problem Relation Age of Onset   Cancer  Mother        unsure of type   Cancer Father        unsure of type   Colon cancer Neg Hx    Esophageal cancer Neg Hx    Rectal cancer Neg Hx    Stomach cancer Neg Hx    Breast cancer Neg Hx       Review of Systems  All other systems reviewed and are negative.      Objective:   Physical Exam Vitals reviewed.  Constitutional:      General: She is not in acute distress.    Appearance: She is well-developed. She is not diaphoretic.  HENT:     Head: Normocephalic and atraumatic.     Right Ear: External ear normal.     Left Ear: External ear normal.     Nose: Nose normal.     Mouth/Throat:     Pharynx: No oropharyngeal exudate.  Eyes:     General: No scleral icterus.       Right eye: No discharge.        Left eye: No discharge.     Conjunctiva/sclera: Conjunctivae normal.     Pupils: Pupils are equal, round, and reactive to light.  Neck:     Thyroid : No thyromegaly.     Vascular: No JVD.     Trachea: No tracheal deviation.  Cardiovascular:     Rate and Rhythm: Normal rate and regular rhythm.     Heart sounds: Normal heart sounds. No murmur heard.    No friction rub. No gallop.  Pulmonary:     Effort: Pulmonary effort is normal. No respiratory distress.     Breath sounds: Normal breath sounds. No stridor. No wheezing or rales.  Chest:     Chest wall: No tenderness.  Abdominal:     General: Bowel sounds are normal. There is no distension.     Palpations: Abdomen is soft. There is no mass.     Tenderness: There is no abdominal tenderness. There is no guarding or rebound.     Hernia: No  hernia is present.   Musculoskeletal:        General: No tenderness or deformity. Normal range of motion.     Cervical back: Normal range of motion and neck supple.  Lymphadenopathy:     Cervical: No cervical adenopathy.  Skin:    General: Skin is warm.     Capillary Refill: Capillary refill takes less than 2 seconds.     Findings: No erythema or rash.  Neurological:     Mental Status: She is alert and oriented to person, place, and time.     Cranial Nerves: No cranial nerve deficit.     Sensory: No sensory deficit.     Motor: No abnormal muscle tone.     Coordination: Coordination normal.     Deep Tendon Reflexes: Reflexes normal.  Psychiatric:        Behavior: Behavior normal.        Thought Content: Thought content normal.        Judgment: Judgment normal.           Assessment & Plan:  Encounter for screening mammogram for malignant neoplasm of breast - Plan: MM Digital Screening  Colon cancer screening - Plan: Ambulatory referral to Gastroenterology  Postmenopausal estrogen deficiency - Plan: DG Bone Density  Screening cholesterol level - Plan: CBC with Differential/Platelet, Comprehensive metabolic panel with GFR, Lipid panel  General medical exam With regards to  her immunizations, I encouraged her to make sure that she has had the shingles vaccine with her local pharmacy.  Meanwhile we gave her her flu shot today.  With regards to her cancer screening I will schedule the patient for a mammogram.  I also put in a referral for GI so that she can complete her colon cancer screening.  Recommended that she also receive a bone density due to her history of postmenopausal estrogen deficiency.  Her blood pressure today is outstanding but I will check a CBC a CMP and a lipid panel to screen for hyperlipidemia.  Regular anticipatory guidance is provided "

## 2024-02-05 ENCOUNTER — Ambulatory Visit: Payer: Self-pay | Admitting: Family Medicine

## 2024-02-05 LAB — CBC WITH DIFFERENTIAL/PLATELET
Absolute Lymphocytes: 1818 {cells}/uL (ref 850–3900)
Absolute Monocytes: 403 {cells}/uL (ref 200–950)
Basophils Absolute: 43 {cells}/uL (ref 0–200)
Basophils Relative: 0.7 %
Eosinophils Absolute: 140 {cells}/uL (ref 15–500)
Eosinophils Relative: 2.3 %
HCT: 41.6 % (ref 35.9–46.0)
Hemoglobin: 13.4 g/dL (ref 11.7–15.5)
MCH: 30.2 pg (ref 27.0–33.0)
MCHC: 32.2 g/dL (ref 31.6–35.4)
MCV: 93.9 fL (ref 81.4–101.7)
MPV: 11.1 fL (ref 7.5–12.5)
Monocytes Relative: 6.6 %
Neutro Abs: 3697 {cells}/uL (ref 1500–7800)
Neutrophils Relative %: 60.6 %
Platelets: 214 Thousand/uL (ref 140–400)
RBC: 4.43 Million/uL (ref 3.80–5.10)
RDW: 11.8 % (ref 11.0–15.0)
Total Lymphocyte: 29.8 %
WBC: 6.1 Thousand/uL (ref 3.8–10.8)

## 2024-02-05 LAB — COMPREHENSIVE METABOLIC PANEL WITH GFR
AG Ratio: 1.6 (calc) (ref 1.0–2.5)
ALT: 15 U/L (ref 6–29)
AST: 15 U/L (ref 10–35)
Albumin: 4.1 g/dL (ref 3.6–5.1)
Alkaline phosphatase (APISO): 94 U/L (ref 37–153)
BUN: 20 mg/dL (ref 7–25)
CO2: 29 mmol/L (ref 20–32)
Calcium: 9.4 mg/dL (ref 8.6–10.4)
Chloride: 107 mmol/L (ref 98–110)
Creat: 0.87 mg/dL (ref 0.50–1.05)
Globulin: 2.6 g/dL (ref 1.9–3.7)
Glucose, Bld: 70 mg/dL (ref 65–99)
Potassium: 4.4 mmol/L (ref 3.5–5.3)
Sodium: 143 mmol/L (ref 135–146)
Total Bilirubin: 0.4 mg/dL (ref 0.2–1.2)
Total Protein: 6.7 g/dL (ref 6.1–8.1)
eGFR: 72 mL/min/1.73m2

## 2024-02-05 LAB — LIPID PANEL
Cholesterol: 169 mg/dL
HDL: 54 mg/dL
LDL Cholesterol (Calc): 88 mg/dL
Non-HDL Cholesterol (Calc): 115 mg/dL
Total CHOL/HDL Ratio: 3.1 (calc)
Triglycerides: 174 mg/dL — ABNORMAL HIGH

## 2024-02-15 ENCOUNTER — Ambulatory Visit: Admitting: Family Medicine

## 2024-02-15 ENCOUNTER — Encounter: Payer: Self-pay | Admitting: Family Medicine

## 2024-02-15 VITALS — BP 118/72 | HR 58 | Temp 98.0°F | Ht 69.0 in | Wt 203.0 lb

## 2024-02-15 DIAGNOSIS — D485 Neoplasm of uncertain behavior of skin: Secondary | ICD-10-CM

## 2024-02-15 DIAGNOSIS — L989 Disorder of the skin and subcutaneous tissue, unspecified: Secondary | ICD-10-CM

## 2024-02-15 NOTE — Progress Notes (Signed)
 "  Subjective:    Patient ID: Kathryn Zavala , female    DOB: 30-Aug-1955, 69 y.o.   MRN: [id number]  Patient has a hard papular lesion growing inside her gluteal cleft on the right side.  It is tender and sore.  On examination there is a hyperkeratotic black papule which seems to be emanating from a central pore of a sebaceous cyst.  It is roughly 5 mm in diameter.  There is no erythema or exudate.  The patient would like this excised. Past Medical History:  Diagnosis Date   Cancer [facility])    2010 Colon with mets to the liver, partial colectomy, chemo, and liver met resection   Cataract    Left leg pain    numbness   Stroke (HCC)    Pt reports that she thinks she had a stroke in her late 34s, early 30s.    Past Surgical History:  Procedure Laterality Date   ABDOMINAL HYSTERECTOMY     due to fibroids at 28, [facility]   COLON SURGERY     COLONOSCOPY     JOINT REPLACEMENT     TKR x 2 on left knee   LIVER SURGERY     Current Outpatient Medications on File Prior to Visit  Medication Sig Dispense Refill   escitalopram  (LEXAPRO ) 20 MG tablet Take 1 tablet (20 mg total) by mouth daily. (Patient not taking: Reported on 06/19/2023) 90 tablet 3   gabapentin  (NEURONTIN ) 300 MG capsule Take 1 capsule (300 mg total) by mouth 2 (two) times daily. (Patient not taking: Reported on 02/04/2024) 180 capsule 3   meloxicam  (MOBIC ) 15 MG tablet Take 1 tablet (15 mg total) by mouth daily. (Patient not taking: Reported on 06/19/2023) 30 tablet 3   methocarbamol  (ROBAXIN ) 500 MG tablet Take 1 tablet (500 mg total) by mouth 2 (two) times daily. (Patient not taking: Reported on 06/19/2023) 20 tablet 0   tamsulosin  (FLOMAX ) 0.4 MG CAPS capsule Take 1 capsule (0.4 mg total) by mouth daily. (Patient not taking: Reported on 06/19/2023) 30 capsule 0   zolpidem  (AMBIEN ) 10 MG tablet Take 1 tablet (10 mg total) by mouth at bedtime as needed for sleep. (Patient not taking: Reported on 02/04/2024) 15 tablet 1   No current  facility-administered medications on file prior to visit.   Allergies  Allergen Reactions   Duloxetine  Diarrhea, Nausea And Vomiting and Other (See Comments)    red blisters on face and arms   Social History   Socioeconomic History   Marital status: Domestic Partner    Spouse name: Meade   Number of children: 5   Years of education: 12   Highest education level: Some college, no degree  Occupational History   Occupation: production designer, theatre/television/film  Tobacco Use   Smoking status: Former    Types: Cigarettes   Smokeless tobacco: Never   Tobacco comments:    high school days  Vaping Use   Vaping status: Never Used  Substance and Sexual Activity   Alcohol use: Yes    Comment: beer every other day   Drug use: No   Sexual activity: Not on file  Other Topics Concern   Not on file  Social History Narrative   Lives at home with her domestic partner, Meade.   Right-handed.   Two cups coffee per day.   Works at [employer].   11 grandchildren.   Social Drivers of Health   Tobacco Use: Medium Risk (02/15/2024)   Patient History  Smoking Tobacco Use: Former    Smokeless Tobacco Use: Never    Passive Exposure: Not on Actuary Strain: Low Risk (02/26/2023)   Overall Financial Resource Strain (CARDIA)    Difficulty of Paying Living Expenses: Not very hard  Food Insecurity: No Food Insecurity (02/26/2023)   Hunger Vital Sign    Worried About Running Out of Food in the Last Year: Never true    Ran Out of Food in the Last Year: Never true  Transportation Needs: No Transportation Needs (02/26/2023)   PRAPARE - Administrator, Civil Service (Medical): No    Lack of Transportation (Non-Medical): No  Physical Activity: Sufficiently Active (02/26/2023)   Exercise Vital Sign    Days of Exercise per Week: 5 days    Minutes of Exercise per Session: 30 min  Stress: Stress Concern Present (02/26/2023)   [employer] of Occupational Health - Occupational  Stress Questionnaire    Feeling of Stress : To some extent  Social Connections: Moderately Integrated (02/26/2023)   Social Connection and Isolation Panel    Frequency of Communication with Friends and Family: More than three times a week    Frequency of Social Gatherings with Friends and Family: Twice a week    Attends Religious Services: More than 4 times per year    Active Member of [employer] or Organizations: No    Attends Banker Meetings: Not on file    Marital Status: Married  Catering Manager Violence: Not At Risk (04/27/2022)   Humiliation, Afraid, Rape, and Kick questionnaire    Fear of Current or Ex-Partner: No    Emotionally Abused: No    Physically Abused: No    Sexually Abused: No  Depression (PHQ2-9): Low Risk (02/27/2023)   Depression (PHQ2-9)    PHQ-2 Score: 0  Alcohol Screen: Low Risk (02/26/2023)   Alcohol Screen    Last Alcohol Screening Score (AUDIT): 3  Housing: Low Risk (02/26/2023)   Housing Stability Vital Sign    Unable to Pay for Housing in the Last Year: No    Number of Times Moved in the Last Year: 0    Homeless in the Last Year: No  Utilities: Not At Risk (04/27/2022)   AHC Utilities    Threatened with loss of utilities: No  Health Literacy: Not on file   Family History  Problem Relation Age of Onset   Cancer Mother        unsure of type   Cancer Father        unsure of type   Colon cancer Neg Hx    Esophageal cancer Neg Hx    Rectal cancer Neg Hx    Stomach cancer Neg Hx    Breast cancer Neg Hx       Review of Systems  All other systems reviewed and are negative.      Objective:   Physical Exam Vitals reviewed.  Constitutional:      General: She is not in acute distress.    Appearance: She is well-developed. She is not ill-appearing, toxic-appearing or diaphoretic.  HENT:     Head: Normocephalic and atraumatic.  Neck:     Thyroid : No thyromegaly.     Vascular: No JVD.     Trachea: No tracheal deviation.  Cardiovascular:      Rate and Rhythm: Normal rate and regular rhythm.     Heart sounds: Normal heart sounds. No murmur heard.    No friction rub. No gallop.  Pulmonary:     Effort: Pulmonary effort is normal. No tachypnea, accessory muscle usage, prolonged expiration or respiratory distress.     Breath sounds: No stridor or decreased air movement. No decreased breath sounds, wheezing or rales.  Chest:     Chest wall: No tenderness.  Musculoskeletal:       Legs:  Lymphadenopathy:     Cervical: No cervical adenopathy.  Neurological:     Mental Status: She is alert.     Motor: No abnormal muscle tone.    5 mm lesion is inside the gluteal cleft on the right-hand side       Assessment & Plan:  Skin lesion I believe that the lesion is likely a keratinaceous plug coming from a sebaceous cyst.  The other possibility would be a keratoacanthoma.  It is 5 mm in diameter.  I anesthetized the skin with 0.1% lidocaine  with epinephrine.  The skin was prepped and draped in sterile fashion.  I performed a 5 mm punch biopsy of the entire lesion down to the subcutaneous fascia.  I then closed the skin edges with 1 simple interrupted 3-0 Ethilon suture.  Lesion was sent to pathology in a labeled container.  Sutures out in 7 days.  Wound care was discussed. "

## 2024-02-19 LAB — TISSUE SPECIMEN

## 2024-02-19 LAB — PATHOLOGY REPORT

## 2024-02-21 ENCOUNTER — Ambulatory Visit: Payer: Self-pay | Admitting: Family Medicine

## 2024-02-22 ENCOUNTER — Ambulatory Visit: Admitting: Family Medicine

## 2025-02-05 ENCOUNTER — Encounter: Admitting: Family Medicine
